# Patient Record
Sex: Female | Born: 1945
Health system: Southern US, Community
[De-identification: ages and names within clinical notes are randomized; demographics above are authoritative.]

## PROBLEM LIST (undated history)

## (undated) DIAGNOSIS — R413 Other amnesia: Secondary | ICD-10-CM

## (undated) DIAGNOSIS — E785 Hyperlipidemia, unspecified: Secondary | ICD-10-CM

## (undated) DIAGNOSIS — S3992XA Unspecified injury of lower back, initial encounter: Secondary | ICD-10-CM

## (undated) DIAGNOSIS — I10 Essential (primary) hypertension: Secondary | ICD-10-CM

## (undated) DIAGNOSIS — J45909 Unspecified asthma, uncomplicated: Secondary | ICD-10-CM

## (undated) DIAGNOSIS — M1990 Unspecified osteoarthritis, unspecified site: Secondary | ICD-10-CM

## (undated) DIAGNOSIS — I519 Heart disease, unspecified: Secondary | ICD-10-CM

## (undated) DIAGNOSIS — I219 Acute myocardial infarction, unspecified: Secondary | ICD-10-CM

## (undated) DIAGNOSIS — T7840XA Allergy, unspecified, initial encounter: Secondary | ICD-10-CM

## (undated) DIAGNOSIS — Z8619 Personal history of other infectious and parasitic diseases: Secondary | ICD-10-CM

## (undated) DIAGNOSIS — G43909 Migraine, unspecified, not intractable, without status migrainosus: Secondary | ICD-10-CM

## (undated) DIAGNOSIS — Z86718 Personal history of other venous thrombosis and embolism: Secondary | ICD-10-CM

## (undated) HISTORY — PX: ABDOMINAL HYSTERECTOMY: SHX81

## (undated) HISTORY — DX: Migraine, unspecified, not intractable, without status migrainosus: G43.909

## (undated) HISTORY — PX: APPENDECTOMY: SHX54

## (undated) HISTORY — DX: Unspecified osteoarthritis, unspecified site: M19.90

## (undated) HISTORY — DX: Allergy, unspecified, initial encounter: T78.40XA

## (undated) HISTORY — DX: Personal history of other venous thrombosis and embolism: Z86.718

## (undated) HISTORY — PX: BOWEL RESECTION: SHX1257

## (undated) HISTORY — DX: Heart disease, unspecified: I51.9

## (undated) HISTORY — DX: Essential (primary) hypertension: I10

## (undated) HISTORY — DX: Hyperlipidemia, unspecified: E78.5

---

## 2008-01-01 ENCOUNTER — Ambulatory Visit: Payer: Self-pay | Admitting: Family Medicine

## 2010-09-19 LAB — HM MAMMOGRAPHY: HM Mammogram: NORMAL

## 2010-09-27 ENCOUNTER — Emergency Department: Payer: Self-pay | Admitting: Emergency Medicine

## 2012-12-01 ENCOUNTER — Ambulatory Visit: Payer: Self-pay

## 2014-07-17 ENCOUNTER — Observation Stay: Payer: Self-pay | Admitting: Specialist

## 2014-07-17 LAB — CBC
HCT: 41 % (ref 35.0–47.0)
HGB: 12.8 g/dL (ref 12.0–16.0)
MCH: 26.5 pg (ref 26.0–34.0)
MCHC: 31.3 g/dL — ABNORMAL LOW (ref 32.0–36.0)
MCV: 85 fL (ref 80–100)
PLATELETS: 288 10*3/uL (ref 150–440)
RBC: 4.83 10*6/uL (ref 3.80–5.20)
RDW: 14.2 % (ref 11.5–14.5)
WBC: 11.9 10*3/uL — AB (ref 3.6–11.0)

## 2014-07-17 LAB — COMPREHENSIVE METABOLIC PANEL
ALBUMIN: 3.7 g/dL (ref 3.4–5.0)
ANION GAP: 13 (ref 7–16)
Alkaline Phosphatase: 67 U/L
BILIRUBIN TOTAL: 0.5 mg/dL (ref 0.2–1.0)
BUN: 14 mg/dL (ref 7–18)
Calcium, Total: 9 mg/dL (ref 8.5–10.1)
Chloride: 106 mmol/L (ref 98–107)
Co2: 22 mmol/L (ref 21–32)
Creatinine: 0.94 mg/dL (ref 0.60–1.30)
EGFR (Non-African Amer.): >60
Glucose: 98 mg/dL (ref 65–99)
Osmolality: 282 (ref 275–301)
Potassium: 3.9 mmol/L (ref 3.5–5.1)
SGOT(AST): 24 U/L (ref 15–37)
SGPT (ALT): 18 U/L
Sodium: 141 mmol/L (ref 136–145)
Total Protein: 6.8 g/dL (ref 6.4–8.2)

## 2014-07-17 LAB — URINALYSIS, COMPLETE
BACTERIA: NONE SEEN
Bilirubin,UR: NEGATIVE
Blood: NEGATIVE
Glucose,UR: NEGATIVE mg/dL (ref 0–75)
Leukocyte Esterase: NEGATIVE
Nitrite: NEGATIVE
PH: 7 (ref 4.5–8.0)
Protein: NEGATIVE
RBC,UR: <1 /HPF (ref 0–5)
SQUAMOUS EPITHELIAL: NONE SEEN
Specific Gravity: 1.008 (ref 1.003–1.030)

## 2014-07-17 LAB — DRUG SCREEN, URINE

## 2014-07-17 LAB — TROPONIN I: Troponin-I: <0.02 ng/mL

## 2014-07-17 LAB — ETHANOL: Ethanol: <3 mg/dL

## 2014-07-18 LAB — BASIC METABOLIC PANEL
Anion Gap: 8 (ref 7–16)
BUN: 12 mg/dL (ref 7–18)
CALCIUM: 9.1 mg/dL (ref 8.5–10.1)
CHLORIDE: 106 mmol/L (ref 98–107)
CO2: 25 mmol/L (ref 21–32)
Creatinine: 0.88 mg/dL (ref 0.60–1.30)
EGFR (African American): >60
Glucose: 82 mg/dL (ref 65–99)
Osmolality: 276 (ref 275–301)
Potassium: 3.5 mmol/L (ref 3.5–5.1)
Sodium: 139 mmol/L (ref 136–145)

## 2014-07-18 LAB — CBC WITH DIFFERENTIAL/PLATELET
BASOS PCT: 0.2 %
Basophil #: 0 10*3/uL (ref 0.0–0.1)
EOS ABS: 0 10*3/uL (ref 0.0–0.7)
Eosinophil %: 0.3 %
HCT: 38.1 % (ref 35.0–47.0)
HGB: 12 g/dL (ref 12.0–16.0)
LYMPHS ABS: 1.9 10*3/uL (ref 1.0–3.6)
LYMPHS PCT: 20 %
MCH: 26.7 pg (ref 26.0–34.0)
MCHC: 31.4 g/dL — AB (ref 32.0–36.0)
MCV: 85 fL (ref 80–100)
MONO ABS: 0.8 x10 3/mm (ref 0.2–0.9)
MONOS PCT: 8.7 %
NEUTROS ABS: 6.8 10*3/uL — AB (ref 1.4–6.5)
Neutrophil %: 70.8 %
PLATELETS: 260 10*3/uL (ref 150–440)
RBC: 4.48 10*6/uL (ref 3.80–5.20)
RDW: 13.8 % (ref 11.5–14.5)
WBC: 9.6 10*3/uL (ref 3.6–11.0)

## 2014-07-18 LAB — LIPID PANEL
Cholesterol: 206 mg/dL — ABNORMAL HIGH (ref 0–200)
HDL Cholesterol: 57 mg/dL (ref 40–60)
Ldl Cholesterol, Calc: 133 mg/dL — ABNORMAL HIGH (ref 0–100)
Triglycerides: 78 mg/dL (ref 0–200)
VLDL CHOLESTEROL, CALC: 16 mg/dL (ref 5–40)

## 2014-07-18 LAB — TSH: THYROID STIMULATING HORM: 0.601 u[IU]/mL

## 2014-08-30 DIAGNOSIS — S060X9A Concussion with loss of consciousness of unspecified duration, initial encounter: Secondary | ICD-10-CM | POA: Insufficient documentation

## 2014-09-05 LAB — HM COLONOSCOPY: HM Colonoscopy: NORMAL

## 2014-09-13 ENCOUNTER — Emergency Department: Payer: Self-pay | Admitting: Emergency Medicine

## 2014-09-13 LAB — BASIC METABOLIC PANEL
ANION GAP: 8 (ref 7–16)
BUN: 10 mg/dL (ref 7–18)
CHLORIDE: 107 mmol/L (ref 98–107)
Calcium, Total: 8.6 mg/dL (ref 8.5–10.1)
Co2: 23 mmol/L (ref 21–32)
Creatinine: 1.06 mg/dL (ref 0.60–1.30)
GLUCOSE: 98 mg/dL (ref 65–99)
Osmolality: 275 (ref 275–301)
Potassium: 3.8 mmol/L (ref 3.5–5.1)
Sodium: 138 mmol/L (ref 136–145)

## 2014-09-13 LAB — CBC
HCT: 39.3 % (ref 35.0–47.0)
HGB: 12.6 g/dL (ref 12.0–16.0)
MCH: 27.3 pg (ref 26.0–34.0)
MCHC: 32 g/dL (ref 32.0–36.0)
MCV: 85 fL (ref 80–100)
PLATELETS: 281 10*3/uL (ref 150–440)
RBC: 4.6 10*6/uL (ref 3.80–5.20)
RDW: 14.7 % — ABNORMAL HIGH (ref 11.5–14.5)
WBC: 9.1 10*3/uL (ref 3.6–11.0)

## 2014-09-13 LAB — TROPONIN I: Troponin-I: <0.02 ng/mL

## 2014-10-05 DIAGNOSIS — G2581 Restless legs syndrome: Secondary | ICD-10-CM | POA: Insufficient documentation

## 2014-11-08 DIAGNOSIS — F0781 Postconcussional syndrome: Secondary | ICD-10-CM | POA: Insufficient documentation
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
## 2015-07-11 ENCOUNTER — Other Ambulatory Visit: Payer: Self-pay | Admitting: Unknown Physician Specialty

## 2015-09-20 ENCOUNTER — Encounter: Payer: Self-pay | Admitting: Family Medicine

## 2015-09-20 ENCOUNTER — Encounter (INDEPENDENT_AMBULATORY_CARE_PROVIDER_SITE_OTHER): Payer: Self-pay

## 2015-09-20 ENCOUNTER — Ambulatory Visit (INDEPENDENT_AMBULATORY_CARE_PROVIDER_SITE_OTHER): Payer: Medicare Other | Admitting: Family Medicine

## 2015-09-20 VITALS — BP 117/75 | HR 68 | Temp 98.0°F | Resp 16 | Ht 61.0 in | Wt 114.2 lb

## 2015-09-20 DIAGNOSIS — Z23 Encounter for immunization: Secondary | ICD-10-CM | POA: Diagnosis not present

## 2015-09-20 DIAGNOSIS — I10 Essential (primary) hypertension: Secondary | ICD-10-CM | POA: Diagnosis not present

## 2015-09-20 DIAGNOSIS — E785 Hyperlipidemia, unspecified: Secondary | ICD-10-CM

## 2015-09-20 DIAGNOSIS — G8929 Other chronic pain: Secondary | ICD-10-CM | POA: Diagnosis not present

## 2015-09-20 DIAGNOSIS — J4520 Mild intermittent asthma, uncomplicated: Secondary | ICD-10-CM | POA: Diagnosis not present

## 2015-09-20 DIAGNOSIS — Z7189 Other specified counseling: Secondary | ICD-10-CM | POA: Diagnosis not present

## 2015-09-20 DIAGNOSIS — R011 Cardiac murmur, unspecified: Secondary | ICD-10-CM | POA: Insufficient documentation

## 2015-09-20 DIAGNOSIS — J45909 Unspecified asthma, uncomplicated: Secondary | ICD-10-CM | POA: Insufficient documentation

## 2015-09-20 DIAGNOSIS — J4540 Moderate persistent asthma, uncomplicated: Secondary | ICD-10-CM | POA: Insufficient documentation

## 2015-09-20 DIAGNOSIS — Z7689 Persons encountering health services in other specified circumstances: Secondary | ICD-10-CM

## 2015-09-20 MED ORDER — ASPIRIN EC 81 MG PO TBEC: 81.0000 mg | DELAYED_RELEASE_TABLET | Freq: Every day | ORAL | Status: AC

## 2015-09-20 NOTE — Assessment & Plan Note (Signed)
Continue atorvastatin to lower hyperlipidemia. Check lipid and CMP.
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
## 2015-09-20 NOTE — Assessment & Plan Note (Signed)
Controlled in the office today. Continue current medications.  ACE for renal protection.  Check CMP

## 2015-09-20 NOTE — Assessment & Plan Note (Signed)
Pt aware this practice does not prescribe chronic pain. She will continue her tramadol from previous provider.  Pt would like to discuss referral to pain provider at next visit.

## 2015-09-20 NOTE — Assessment & Plan Note (Signed)
Occasional symptoms. Pt has UTD inhaler at home. Due for spirometry next visit.

## 2015-09-20 NOTE — Assessment & Plan Note (Signed)
Pt reports murmur heard in the past. Unsure if cardiology consult or ECHO done. Awaiting records from previous provider. Consider cardiology consult or ECHO if not done.

## 2015-09-20 NOTE — Patient Instructions (Signed)
Your goal blood pressure is 140/90. Work on low salt/sodium diet - goal <2.5gm (2,500mg) per day. Eat a diet high in fruits/vegetables and whole grains.  Look into mediterranean and DASH diet. Goal activity is 150min/wk of moderate intensity exercise.  This can be split into 30 minute chunks.  If you are not at this level, you can start with smaller 10-15 min increments and slowly build up activity. Look at www.heart.org for more resources  Please seek immediate medical attention at ER or Urgent Care if you develop: Chest pain, pressure or tightness. Shortness of breath accompanied by nausea or diaphoresis Visual changes Numbness or tingling on one side of the body Facial droop Altered mental status Or any concerning symptoms.  

## 2015-10-04 ENCOUNTER — Other Ambulatory Visit: Payer: Self-pay | Admitting: Unknown Physician Specialty

## 2015-10-18 DIAGNOSIS — I10 Essential (primary) hypertension: Secondary | ICD-10-CM | POA: Diagnosis not present

## 2015-10-18 DIAGNOSIS — E785 Hyperlipidemia, unspecified: Secondary | ICD-10-CM | POA: Diagnosis not present

## 2015-11-09 DIAGNOSIS — I10 Essential (primary) hypertension: Secondary | ICD-10-CM | POA: Diagnosis not present

## 2015-11-09 DIAGNOSIS — E785 Hyperlipidemia, unspecified: Secondary | ICD-10-CM | POA: Diagnosis not present

## 2015-11-10 LAB — LIPID PANEL
CHOL/HDL RATIO: 4.1 ratio (ref 0.0–4.4)
Cholesterol, Total: 205 mg/dL — ABNORMAL HIGH (ref 100–199)
HDL: 50 mg/dL (ref >39)
LDL Calculated: 130 mg/dL — ABNORMAL HIGH (ref 0–99)
TRIGLYCERIDES: 124 mg/dL (ref 0–149)
VLDL Cholesterol Cal: 25 mg/dL (ref 5–40)

## 2015-11-10 LAB — COMPREHENSIVE METABOLIC PANEL
ALBUMIN: 4.3 g/dL (ref 3.6–4.8)
ALK PHOS: 64 IU/L (ref 39–117)
ALT: 15 IU/L (ref 0–32)
AST: 19 IU/L (ref 0–40)
Albumin/Globulin Ratio: 2 (ref 1.1–2.5)
BILIRUBIN TOTAL: 0.5 mg/dL (ref 0.0–1.2)
BUN / CREAT RATIO: 7 — AB (ref 11–26)
BUN: 7 mg/dL — ABNORMAL LOW (ref 8–27)
CHLORIDE: 103 mmol/L (ref 96–106)
CO2: 25 mmol/L (ref 18–29)
Calcium: 9.2 mg/dL (ref 8.7–10.3)
Creatinine, Ser: 0.95 mg/dL (ref 0.57–1.00)
GFR calc Af Amer: 72 mL/min/{1.73_m2} (ref >59)
GFR calc non Af Amer: 62 mL/min/{1.73_m2} (ref >59)
GLUCOSE: 85 mg/dL (ref 65–99)
Globulin, Total: 2.2 g/dL (ref 1.5–4.5)
Potassium: 4.2 mmol/L (ref 3.5–5.2)
Sodium: 145 mmol/L — ABNORMAL HIGH (ref 134–144)
Total Protein: 6.5 g/dL (ref 6.0–8.5)

## 2015-11-17 ENCOUNTER — Telehealth: Payer: Self-pay | Admitting: Family Medicine

## 2015-11-17 NOTE — Telephone Encounter (Signed)
I have never prescribed that medication for this patient.  It looks like the last time she got it was October of 2016 from a different provider. I would need to see her for a visit to discuss why she needs it/ how often she was taking etc. If it became a chronic medication, she would need to see pain management. Thanks! AK   Notes Recorded by Alease FrameSonya S Carter, CMA on 11/17/2015 at 11:52 AM Made patient aware that she may be referred to pain management for long term pain management. Notes Recorded by Alease FrameSonya S Carter, CMA on 11/17/2015 at 11:51 AM Patient aware is of labs. She is requesting refill/ increase on Tramadol.

## 2015-11-17 NOTE — Telephone Encounter (Signed)
-----   Message from Alease FrameSonya S Carter, [place] sent at 11/17/2015 11:52 AM EST ----- Made patient aware that she may be referred to pain management for long term pain management.

## 2015-11-17 NOTE — Telephone Encounter (Signed)
-----   Message from Kirsten [name], [place] sent at 11/17/2015 11:51 AM EST ----- Patient aware is of labs. She is requesting refill/ increase on Tramadol.

## 2015-11-18 ENCOUNTER — Ambulatory Visit (INDEPENDENT_AMBULATORY_CARE_PROVIDER_SITE_OTHER): Payer: Medicare Other | Admitting: Family Medicine

## 2015-11-18 ENCOUNTER — Other Ambulatory Visit: Payer: Self-pay | Admitting: Family Medicine

## 2015-11-18 VITALS — BP 113/75 | HR 73 | Temp 98.0°F | Resp 16 | Ht 61.0 in | Wt 114.0 lb

## 2015-11-18 DIAGNOSIS — M25552 Pain in left hip: Secondary | ICD-10-CM

## 2015-11-18 DIAGNOSIS — R209 Unspecified disturbances of skin sensation: Secondary | ICD-10-CM

## 2015-11-18 DIAGNOSIS — G43909 Migraine, unspecified, not intractable, without status migrainosus: Secondary | ICD-10-CM | POA: Insufficient documentation

## 2015-11-18 DIAGNOSIS — R413 Other amnesia: Secondary | ICD-10-CM | POA: Insufficient documentation

## 2015-11-18 DIAGNOSIS — R51 Headache: Secondary | ICD-10-CM

## 2015-11-18 DIAGNOSIS — M160 Bilateral primary osteoarthritis of hip: Secondary | ICD-10-CM | POA: Insufficient documentation

## 2015-11-18 DIAGNOSIS — G8929 Other chronic pain: Secondary | ICD-10-CM

## 2015-11-18 DIAGNOSIS — R519 Headache, unspecified: Secondary | ICD-10-CM | POA: Insufficient documentation

## 2015-11-18 DIAGNOSIS — IMO0001 Reserved for inherently not codable concepts without codable children: Secondary | ICD-10-CM

## 2015-11-18 MED ORDER — GABAPENTIN 100 MG PO CAPS: 100.0000 mg | ORAL_CAPSULE | Freq: Three times a day (TID) | ORAL | Status: DC

## 2015-11-18 MED ORDER — TRAMADOL-ACETAMINOPHEN 37.5-325 MG PO TABS: 1.0000 | ORAL_TABLET | Freq: Three times a day (TID) | ORAL | Status: DC | PRN

## 2015-11-18 NOTE — Patient Instructions (Addendum)
We will check some things out in your labs to determine the cause of your symptoms. For the hand and feet burning, we can start gabapentin.   Gabapentin: Week 1:  Take 1 pill at bedtime. Week 2: Take 1 pill in the morning and 1 at bedtime if needed.  Week 3: Take 1 pill in the morning, lunch, and bedtime if needed.   Gabapentin capsules or tablets What is this medicine? GABAPENTIN (GA ba pen tin) is used to control partial seizures in adults with epilepsy. It is also used to treat certain types of nerve pain. This medicine may be used for other purposes; ask your health care provider or pharmacist if you have questions. What should I tell my health care provider before I take this medicine? They need to know if you have any of these conditions: -kidney disease -suicidal thoughts, plans, or attempt; a previous suicide attempt by you or a family member -an unusual or allergic reaction to gabapentin, other medicines, foods, dyes, or preservatives -pregnant or trying to get pregnant -breast-feeding How should I use this medicine? Take this medicine by mouth with a glass of water. Follow the directions on the prescription label. You can take it with or without food. If it upsets your stomach, take it with food.Take your medicine at regular intervals. Do not take it more often than directed. Do not stop taking except on your doctor's advice. If you are directed to break the 600 or 800 mg tablets in half as part of your dose, the extra half tablet should be used for the next dose. If you have not used the extra half tablet within 28 days, it should be thrown away. A special MedGuide will be given to you by the pharmacist with each prescription and refill. Be sure to read this information carefully each time. Talk to your pediatrician regarding the use of this medicine in children. Special care may be needed. Overdosage: If you think you have taken too much of this medicine contact a poison control  center or emergency room at once. NOTE: This medicine is only for you. Do not share this medicine with others. What if I miss a dose? If you miss a dose, take it as soon as you can. If it is almost time for your next dose, take only that dose. Do not take double or extra doses. What may interact with this medicine? Do not take this medicine with any of the following medications: -other gabapentin products This medicine may also interact with the following medications: -alcohol -antacids -antihistamines for allergy, cough and cold -certain medicines for anxiety or sleep -certain medicines for depression or psychotic disturbances -homatropine; hydrocodone -naproxen -narcotic medicines (opiates) for pain -phenothiazines like chlorpromazine, mesoridazine, prochlorperazine, thioridazine This list may not describe all possible interactions. Give your health care provider a list of all the medicines, herbs, non-prescription drugs, or dietary supplements you use. Also tell them if you smoke, drink alcohol, or use illegal drugs. Some items may interact with your medicine. What should I watch for while using this medicine? Visit your doctor or health care professional for regular checks on your progress. You may want to keep a record at home of how you feel your condition is responding to treatment. You may want to share this information with your doctor or health care professional at each visit. You should contact your doctor or health care professional if your seizures get worse or if you have any new types of seizures. Do not  stop taking this medicine or any of your seizure medicines unless instructed by your doctor or health care professional. Stopping your medicine suddenly can increase your seizures or their severity. Wear a medical identification bracelet or chain if you are taking this medicine for seizures, and carry a card that lists all your medications. You may get drowsy, dizzy, or have  blurred vision. Do not drive, use machinery, or do anything that needs mental alertness until you know how this medicine affects you. To reduce dizzy or fainting spells, do not sit or stand up quickly, especially if you are an older patient. Alcohol can increase drowsiness and dizziness. Avoid alcoholic drinks. Your mouth may get dry. Chewing sugarless gum or sucking hard candy, and drinking plenty of water will help. The use of this medicine may increase the chance of suicidal thoughts or actions. Pay special attention to how you are responding while on this medicine. Any worsening of mood, or thoughts of suicide or dying should be reported to your health care professional right away. Women who become pregnant while using this medicine may enroll in the [place] Antiepileptic Drug Pregnancy Registry by calling [phone])[phone]. This registry collects information about the safety of antiepileptic drug use during pregnancy. What side effects may I notice from receiving this medicine? Side effects that you should report to your doctor or health care professional as soon as possible: -allergic reactions like skin rash, itching or hives, swelling of the face, lips, or tongue -worsening of mood, thoughts or actions of suicide or dying Side effects that usually do not require medical attention (report to your doctor or health care professional if they continue or are bothersome): -constipation -difficulty walking or controlling muscle movements -dizziness -nausea -slurred speech -tiredness -tremors -weight gain This list may not describe all possible side effects. Call your doctor for medical advice about side effects. You may report side effects to FDA at 1-800-FDA-1088. Where should I keep my medicine? Keep out of reach of children. This medicine may cause accidental overdose and death if it taken by other adults, children, or pets. Mix any unused medicine with a substance like cat litter or  coffee grounds. Then throw the medicine away in a sealed container like a sealed bag or a coffee can with a lid. Do not use the medicine after the expiration date. Store at room temperature between 15 and 30 degrees C (59 and 86 degrees F). NOTE: This sheet is a summary. It may not cover all possible information. If you have questions about this medicine, talk to your doctor, pharmacist, or health care provider.    2016, Elsevier/Gold Standard. (2013-12-18 15:26:50)

## 2015-11-18 NOTE — Assessment & Plan Note (Addendum)
PRN occasional tramadol given. Pt is aware that if she requires an increasing dose of is taking daily we may send her to chronic pain for evaluation.  Recommended PT- pt declined.  Gentle stretching at home.
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
## 2015-11-19 LAB — CBC WITH DIFFERENTIAL/PLATELET
BASOS: 0 %
Basophils Absolute: 0 10*3/uL (ref 0.0–0.2)
EOS (ABSOLUTE): 0.1 10*3/uL (ref 0.0–0.4)
Eos: 1 %
HEMATOCRIT: 37.1 % (ref 34.0–46.6)
Hemoglobin: 12 g/dL (ref 11.1–15.9)
IMMATURE GRANS (ABS): 0 10*3/uL (ref 0.0–0.1)
Immature Granulocytes: 0 %
LYMPHS: 27 %
Lymphocytes Absolute: 2.3 10*3/uL (ref 0.7–3.1)
MCH: 27.2 pg (ref 26.6–33.0)
MCHC: 32.3 g/dL (ref 31.5–35.7)
MCV: 84 fL (ref 79–97)
Monocytes Absolute: 0.6 10*3/uL (ref 0.1–0.9)
Monocytes: 7 %
NEUTROS ABS: 5.4 10*3/uL (ref 1.4–7.0)
Neutrophils: 65 %
PLATELETS: 308 10*3/uL (ref 150–379)
RBC: 4.41 x10E6/uL (ref 3.77–5.28)
RDW: 14.8 % (ref 12.3–15.4)
WBC: 8.5 10*3/uL (ref 3.4–10.8)

## 2015-11-19 LAB — VITAMIN B12: Vitamin B-12: 586 pg/mL (ref 211–946)

## 2015-11-19 LAB — HEMOGLOBIN A1C
Est. average glucose Bld gHb Est-mCnc: 117 mg/dL
Hgb A1c MFr Bld: 5.7 % — ABNORMAL HIGH (ref 4.8–5.6)

## 2015-11-19 LAB — TSH: TSH: 1.26 u[IU]/mL (ref 0.450–4.500)

## 2015-12-21 ENCOUNTER — Ambulatory Visit (INDEPENDENT_AMBULATORY_CARE_PROVIDER_SITE_OTHER): Payer: Medicare Other | Admitting: Family Medicine

## 2015-12-21 ENCOUNTER — Other Ambulatory Visit: Payer: Self-pay | Admitting: Family Medicine

## 2015-12-21 VITALS — BP 132/82 | HR 69 | Temp 98.7°F | Resp 16 | Ht 61.0 in | Wt 114.0 lb

## 2015-12-21 DIAGNOSIS — J069 Acute upper respiratory infection, unspecified: Secondary | ICD-10-CM

## 2015-12-21 DIAGNOSIS — J029 Acute pharyngitis, unspecified: Secondary | ICD-10-CM

## 2015-12-21 DIAGNOSIS — R011 Cardiac murmur, unspecified: Secondary | ICD-10-CM

## 2015-12-21 DIAGNOSIS — J209 Acute bronchitis, unspecified: Secondary | ICD-10-CM | POA: Diagnosis not present

## 2015-12-21 LAB — POCT RAPID STREP A (OFFICE): Rapid Strep A Screen: NEGATIVE

## 2015-12-21 MED ORDER — PREDNISONE 20 MG PO TABS: 20.0000 mg | ORAL_TABLET | Freq: Every day | ORAL | Status: DC

## 2015-12-21 MED ORDER — AZITHROMYCIN 250 MG PO TABS: ORAL_TABLET | ORAL | Status: DC

## 2015-12-21 MED ORDER — PREDNISONE 20 MG PO TABS: 40.0000 mg | ORAL_TABLET | Freq: Every day | ORAL | Status: DC

## 2015-12-21 MED ORDER — FLUTICASONE PROPIONATE 50 MCG/ACT NA SUSP: 2.0000 | Freq: Every day | NASAL | Status: DC

## 2015-12-21 MED ORDER — DM-GUAIFENESIN ER 30-600 MG PO TB12: 1.0000 | ORAL_TABLET | Freq: Two times a day (BID) | ORAL | Status: DC

## 2015-12-21 NOTE — Progress Notes (Signed)
Subjective:    Patient ID: Kirsten [name], female    DOB: 1946/02/01, 70 y.o.   MRN: [id number]  HPI: Kirsten [name] is a 70 y.o. female presenting on 12/21/2015 for Nasal Congestion   HPI  Pt presents for nasal congestion and sore throat x 1 mos. Glands are swollen. Nasal congestion. Headache and ear pain. Cough- not productive of sputum. Some chest tightness and feeling short winded. No fevers.    Pt would like to see a cardiologist about a heart murmur heard previously on exam.   Past Medical History  Diagnosis Date  . Allergy   . Arthritis   . History of blood clots   . Depression   . Heart disease   . Hyperlipidemia   . Hypertension   . Migraine headache     Current Outpatient Prescriptions on File Prior to Visit  Medication Sig  . albuterol (PROVENTIL HFA) 108 (90 BASE) MCG/ACT inhaler Inhale 2 puffs into the lungs every 6 (six) hours as needed for wheezing or shortness of breath.  Marland Kitchen amLODipine-benazepril (LOTREL) 10-20 MG capsule TK ONE C PO  QD  . aspirin EC 81 MG tablet Take 1 tablet (81 mg total) by mouth daily.  Marland Kitchen atorvastatin (LIPITOR) 20 MG tablet Take 20 mg by mouth daily.  Marland Kitchen gabapentin (NEURONTIN) 100 MG capsule Take 1 capsule (100 mg total) by mouth 3 (three) times daily.  . traMADol-acetaminophen (ULTRACET) 37.5-325 MG tablet Take 1 tablet by mouth every 8 (eight) hours as needed.   No current facility-administered medications on file prior to visit.    Review of Systems  Constitutional: Negative for fever and chills.  HENT: Positive for congestion, sinus pressure, sore throat and trouble swallowing. Negative for ear pain and sneezing.   Respiratory: Positive for cough and chest tightness. Negative for wheezing.   Cardiovascular: Negative for chest pain and palpitations.  Gastrointestinal: Negative.  Negative for nausea, vomiting and diarrhea.  Musculoskeletal: Negative for neck pain and neck stiffness.  Neurological: Positive for headaches.   Per HPI  unless specifically indicated above     Objective:    BP 132/82 mmHg  Pulse 69  Temp(Src) 98.7 F (37.1 C) (Oral)  Resp 16  Ht  (1.549 m)  Wt 114 lb (51.71 kg)  BMI 21.55 kg/m2  SpO2 97%  Wt Readings from Last 3 Encounters:  12/21/15 114 lb (51.71 kg)  11/18/15 114 lb (51.71 kg)  09/20/15 114 lb 3.2 oz (51.801 kg)    Physical Exam  Constitutional: She appears well-developed and well-nourished. No distress.  HENT:  Head: Normocephalic and atraumatic.  Right Ear: Hearing and tympanic membrane normal. Tympanic membrane is not retracted and not bulging.  Left Ear: Hearing and tympanic membrane normal. Tympanic membrane is not retracted and not bulging.  Nose: Mucosal edema and rhinorrhea present. Right sinus exhibits no maxillary sinus tenderness and no frontal sinus tenderness. Left sinus exhibits no maxillary sinus tenderness and no frontal sinus tenderness.  Mouth/Throat: No uvula swelling. Posterior oropharyngeal erythema present. No oropharyngeal exudate.  Cardiovascular: Normal rate and regular rhythm.  Exam reveals no gallop and no friction rub.   Murmur heard.  Systolic murmur is present with a grade of 2/6  Pulmonary/Chest: No respiratory distress. She has no decreased breath sounds. She has wheezes in the right lower field and the left lower field. She has no rhonchi. She has no rales. Chest wall is not dull to percussion. She exhibits no tenderness.  Skin: She is not  diaphoretic.   Results for orders placed or performed in visit on 12/21/15  POCT rapid strep A  Result Value Ref Range   Rapid Strep A Screen Negative Negative      Assessment & Plan:   Problem List Items Addressed This Visit      Other   Heart murmur    Refer to cardiology for evaluation and management.       Relevant Orders   Ambulatory referral to Cardiology    Other Visit Diagnoses    Sore throat    -  Primary    Rapid strep is negative. Throat culture pending. Supportive care at home.      Relevant Orders    POCT rapid strep A (Completed)    Culture, Group A Strep    Acute bronchitis, unspecified organism        Treat for bronchitis given pt history of asthma. Zpak to cover for repiratory organisms. PRN inhaler use. Prednisone for respiratory inflammation. Alarm symptoms reviewed.     Relevant Medications    azithromycin (ZITHROMAX) 250 MG tablet    predniSONE (DELTASONE) 20 MG tablet    Upper respiratory infection        Supportive care at home. Alarm symptoms and return precautions reviewed.     Relevant Medications    azithromycin (ZITHROMAX) 250 MG tablet    fluticasone (FLONASE) 50 MCG/ACT nasal spray    dextromethorphan-guaiFENesin (MUCINEX DM) 30-600 MG 12hr tablet       Meds ordered this encounter  Medications  . DISCONTD: predniSONE (DELTASONE) 20 MG tablet    Sig: Take 1 tablet (20 mg total) by mouth daily with breakfast.    Dispense:  10 tablet    Refill:  0    Order Specific Question:  Supervising Provider    Answer:  Janeann Forehand [phone]  . azithromycin (ZITHROMAX) 250 MG tablet    Sig: Take 2 pills today and 1 pill daily until the bottle is empty.    Dispense:  6 tablet    Refill:  0    Order Specific Question:  Supervising Provider    Answer:  Janeann Forehand [[id number]]  . fluticasone (FLONASE) 50 MCG/ACT nasal spray    Sig: Place 2 sprays into both nostrils daily.    Dispense:  16 g    Refill:  11    Order Specific Question:  Supervising Provider    Answer:  Janeann Forehand [phone]  . dextromethorphan-guaiFENesin (MUCINEX DM) 30-600 MG 12hr tablet    Sig: Take 1 tablet by mouth 2 (two) times daily.    Dispense:  20 tablet    Refill:  0    Order Specific Question:  Supervising Provider    Answer:  Janeann Forehand [phone]  . predniSONE (DELTASONE) 20 MG tablet    Sig: Take 2 tablets (40 mg total) by mouth daily with breakfast.    Dispense:  10 tablet    Refill:  0    Order Specific Question:  Supervising Provider     Answer:  Janeann Forehand [[id number]]      Follow up plan: Return if symptoms worsen or fail to improve.

## 2015-12-21 NOTE — Assessment & Plan Note (Signed)
Refer to cardiology for evaluation and management.

## 2015-12-21 NOTE — Patient Instructions (Addendum)
You can use supportive care at home to help with your symptoms. I have sent Mucinex DM to your pharmacy to help break up the congestion and soothe your cough. You can takes this twice daily. Honey is a natural cough suppressant- so add it to your tea in the morning.  If you have a humidifer, set that up in your bedroom at night.   Use saline rinses to help with sinuses. Use flonase 2 sprays in the nose once daily to help with nasal drainage and stuffiness.   Alternate advil and tylenol as needed for fever.    Please seek immediate medical attention if you develop shortness of breath not relieve by inhaler, chest pain/tightness, fever > 103 F or other concerning symptoms.

## 2015-12-23 LAB — CULTURE, GROUP A STREP: Strep A Culture: NEGATIVE

## 2015-12-26 ENCOUNTER — Encounter: Payer: Self-pay | Admitting: *Deleted

## 2015-12-26 ENCOUNTER — Ambulatory Visit: Payer: Self-pay | Admitting: Cardiovascular Disease

## 2016-01-10 ENCOUNTER — Ambulatory Visit: Payer: Medicare Other | Admitting: Family Medicine

## 2016-01-11 ENCOUNTER — Ambulatory Visit
Admission: RE | Admit: 2016-01-11 | Discharge: 2016-01-11 | Disposition: A | Discharge status: Home / Self Care | Payer: Medicare Other | Source: Ambulatory Visit | Attending: Family Medicine | Admitting: Family Medicine

## 2016-01-11 ENCOUNTER — Ambulatory Visit (INDEPENDENT_AMBULATORY_CARE_PROVIDER_SITE_OTHER): Payer: Medicare Other | Admitting: Family Medicine

## 2016-01-11 ENCOUNTER — Encounter: Payer: Self-pay | Admitting: Family Medicine

## 2016-01-11 VITALS — BP 135/89 | HR 65 | Temp 98.2°F | Resp 16 | Ht 61.0 in | Wt 114.0 lb

## 2016-01-11 DIAGNOSIS — M160 Bilateral primary osteoarthritis of hip: Secondary | ICD-10-CM | POA: Insufficient documentation

## 2016-01-11 DIAGNOSIS — M25552 Pain in left hip: Secondary | ICD-10-CM

## 2016-01-11 DIAGNOSIS — G8929 Other chronic pain: Secondary | ICD-10-CM | POA: Diagnosis not present

## 2016-01-11 DIAGNOSIS — M1612 Unilateral primary osteoarthritis, left hip: Secondary | ICD-10-CM | POA: Diagnosis not present

## 2016-01-11 NOTE — Patient Instructions (Signed)
We will refer you to orthopedics to discuss management of your hip pain.  We will also get an XR to make sure nothing is wrong with the hip today.   Continue to take pain medication as neede.d

## 2016-01-11 NOTE — Assessment & Plan Note (Signed)
Refer to ortho for evaluation and management. Pt has had good success with joint injections in the past.  Hip XR shows mild degenerative changes.
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
## 2016-01-23 ENCOUNTER — Other Ambulatory Visit: Payer: Self-pay | Admitting: Family Medicine

## 2016-01-23 DIAGNOSIS — M5441 Lumbago with sciatica, right side: Secondary | ICD-10-CM | POA: Diagnosis not present

## 2016-01-23 DIAGNOSIS — G8929 Other chronic pain: Secondary | ICD-10-CM | POA: Diagnosis not present

## 2016-01-24 ENCOUNTER — Telehealth: Payer: Self-pay | Admitting: Family Medicine

## 2016-01-24 NOTE — Telephone Encounter (Signed)
Pt. Called requesting refill on   Tramadol

## 2016-01-24 NOTE — Telephone Encounter (Signed)
Pt reports she is taking one nightly- however sometimes takes 2. She reports she has been in pain since orthopedic visit. Pt advised to call orthopedics. Pt wanted our office to call orthopedics- I told her I was unable to do that. Advised patient to take prednisone as prescribed by orthopedics to help with her pain. If she felt she needed more tramadol after that, she should come into our office for appt. I did reinforce to patient that she would need to see chronic pain is tramadol became more than an occasional medication.

## 2016-01-24 NOTE — Telephone Encounter (Signed)
Called pt and ask her how she is taking her reply she takes only 1 per night and pharmacy reply that she doen't have any refill left and she is in lots of pain she needs more tramadol.

## 2016-01-24 NOTE — Telephone Encounter (Signed)
She was only taking 1 per day and had enough to last her 3 months per her report at our last visit. It was written on 1/13. Looks like it only lasted 2 months. Please call her to see how often she is taking.

## 2016-01-27 ENCOUNTER — Other Ambulatory Visit: Payer: Self-pay | Admitting: Unknown Physician Specialty

## 2016-01-27 ENCOUNTER — Telehealth: Payer: Self-pay

## 2016-01-27 DIAGNOSIS — G8929 Other chronic pain: Secondary | ICD-10-CM

## 2016-01-27 DIAGNOSIS — M5441 Lumbago with sciatica, right side: Principal | ICD-10-CM

## 2016-01-27 DIAGNOSIS — M25552 Pain in left hip: Principal | ICD-10-CM

## 2016-01-27 NOTE — Telephone Encounter (Signed)
Called pt but phone is ringing and no VM set up Kirsten [name] placed an order for her to be seen by pain management and if she runs out before her appointment time with pain management then she needs to schedule an appointment with Kirsten [name].

## 2016-01-27 NOTE — Telephone Encounter (Signed)
Okay I will place an order for chronic pain management. I sent her to ortho for joint injections to help with pain. It seems like she just wants tramadol. Thanks! AK

## 2016-01-27 NOTE — Telephone Encounter (Signed)
[facility] called and said patient was calling both there and here for refills on Tramadol. They have denied them. They will proceed with MRI. Dr. [facility] agrees to send patient to Pain Management and states PCP needs to order this. They did discuss with patient to contact PCP about referral.

## 2016-01-30 ENCOUNTER — Ambulatory Visit (INDEPENDENT_AMBULATORY_CARE_PROVIDER_SITE_OTHER): Payer: Medicare Other | Admitting: Family Medicine

## 2016-01-30 VITALS — BP 139/81 | HR 75 | Temp 99.5°F | Resp 16 | Ht 61.0 in | Wt 112.6 lb

## 2016-01-30 DIAGNOSIS — M25552 Pain in left hip: Secondary | ICD-10-CM

## 2016-01-30 DIAGNOSIS — G8929 Other chronic pain: Secondary | ICD-10-CM

## 2016-01-30 DIAGNOSIS — M5442 Lumbago with sciatica, left side: Secondary | ICD-10-CM | POA: Diagnosis not present

## 2016-01-30 MED ORDER — TRAMADOL-ACETAMINOPHEN 37.5-325 MG PO TABS: 1.0000 | ORAL_TABLET | Freq: Two times a day (BID) | ORAL | Status: DC

## 2016-01-30 NOTE — Patient Instructions (Signed)
I am refilling your tramadol today for up to twice daily. Please only take 1 pill at a time. I have referred you over the a chronic pain specialist to help with your hip and back pain. They will make you an appt to see this doctor.   Please get your MRI done as instructed by orthopedics.

## 2016-01-30 NOTE — Progress Notes (Signed)
Subjective:    Patient ID: Kirsten [name], female    DOB: Oct 20, 1946, 70 y.o.   MRN: [id number]  HPI: Kirsten [name] is a 70 y.o. female presenting on 01/30/2016 for Back Pain   HPI  Pt presents for back pain and hip pain. She is requesting a refill on occasional tramadol. Was recently seen by orthopedics for joint injections and placed on prednisone. She could not take it due to nausea. Pt reports injection did not help. Ortho suggested that she seek a chronic pain referral due to her inability to tolerate their plan of care. They are getting an MRI of her spine on April 14. Pt reports pain level is 8/10. Legs aches at night.  Pt was using occasional tramadol per her report 1-2 times daily to help with pain. Last fill was 11/18/2015 per [place] CSRS for 90 pills. She reports she ran out last week. No other controlled medications.   Past Medical History  Diagnosis Date  . Allergy   . Arthritis   . History of blood clots   . Depression   . Heart disease   . Hyperlipidemia   . Hypertension   . Migraine headache     Current Outpatient Prescriptions on File Prior to Visit  Medication Sig  . albuterol (PROVENTIL HFA) 108 (90 BASE) MCG/ACT inhaler Inhale 2 puffs into the lungs every 6 (six) hours as needed for wheezing or shortness of breath.  Marland Kitchen amLODipine-benazepril (LOTREL) 10-20 MG capsule TK ONE C PO  QD  . aspirin EC 81 MG tablet Take 1 tablet (81 mg total) by mouth daily.  Marland Kitchen atorvastatin (LIPITOR) 20 MG tablet Take 20 mg by mouth daily.  Marland Kitchen dextromethorphan-guaiFENesin (MUCINEX DM) 30-600 MG 12hr tablet Take 1 tablet by mouth 2 (two) times daily.  . fluticasone (FLONASE) 50 MCG/ACT nasal spray SHAKE LIQUID AND USE 2 SPRAYS IN EACH NOSTRIL DAILY  . gabapentin (NEURONTIN) 100 MG capsule Take 1 capsule (100 mg total) by mouth 3 (three) times daily.   No current facility-administered medications on file prior to visit.    Review of Systems  Constitutional: Negative for fever and chills.   HENT: Negative.   Respiratory: Negative for cough, chest tightness and wheezing.   Cardiovascular: Negative for chest pain and leg swelling.  Gastrointestinal: Negative for nausea, vomiting, abdominal pain, diarrhea and constipation.  Endocrine: Negative.  Negative for cold intolerance, heat intolerance, polydipsia, polyphagia and polyuria.  Genitourinary: Negative for dysuria and difficulty urinating.  Musculoskeletal: Positive for back pain and arthralgias.  Neurological: Negative for dizziness, light-headedness and numbness.  Psychiatric/Behavioral: Negative.    Per HPI unless specifically indicated above     Objective:    BP 139/81 mmHg  Pulse 75  Temp(Src) 99.5 F (37.5 C) (Oral)  Resp 16  Ht  (1.549 m)  Wt 112 lb 9.6 oz (51.075 kg)  BMI 21.29 kg/m2  Wt Readings from Last 3 Encounters:  01/30/16 112 lb 9.6 oz (51.075 kg)  01/11/16 114 lb (51.71 kg)  12/21/15 114 lb (51.71 kg)    Physical Exam  Constitutional: She is oriented to person, place, and time. She appears well-developed and well-nourished.  HENT:  Head: Normocephalic and atraumatic.  Neck: Neck supple.  Cardiovascular: Normal rate and regular rhythm.  Exam reveals no gallop and no friction rub.   Murmur heard.  Systolic murmur is present with a grade of 6/6  Pulmonary/Chest: Effort normal and breath sounds normal. She has no wheezes. She exhibits no tenderness.  Abdominal: Soft. Normal appearance and bowel sounds are normal. She exhibits no distension and no mass. There is no tenderness. There is no rebound and no guarding.  Musculoskeletal: She exhibits no edema or tenderness.       Left hip: She exhibits decreased range of motion. She exhibits normal strength, no tenderness and no bony tenderness.       Lumbar back: She exhibits pain (with twisting and bending. ). She exhibits normal range of motion, no tenderness and no spasm.  Negative straight leg test bilaterally  Lymphadenopathy:    She has no  cervical adenopathy.  Neurological: She is alert and oriented to person, place, and time.  Skin: Skin is warm and dry.   Results for orders placed or performed in visit on 12/21/15  Culture, Group A Strep  Result Value Ref Range   Strep A Culture Negative   POCT rapid strep A  Result Value Ref Range   Rapid Strep A Screen Negative Negative      Assessment & Plan:   Problem List Items Addressed This Visit      Other   Chronic hip pain - Primary   Relevant Medications   traMADol-acetaminophen (ULTRACET) 37.5-325 MG tablet    Other Visit Diagnoses    Bilateral low back pain with left-sided sciatica        Pt will get MRI as scheduled by orthopedics. Continue to follow orthopedic recommendations and we will get her into chronic pain.     Relevant Medications    traMADol-acetaminophen (ULTRACET) 37.5-325 MG tablet       Meds ordered this encounter  Medications  . traMADol-acetaminophen (ULTRACET) 37.5-325 MG tablet    Sig: Take 1 tablet by mouth every 12 (twelve) hours.    Dispense:  60 tablet    Refill:  0    Order Specific Question:  Supervising Provider    Answer:  Janeann ForehandHAWKINS JR, JAMES H [[id number]]      Follow up plan: Return if symptoms worsen or fail to improve.

## 2016-02-14 ENCOUNTER — Telehealth: Payer: Self-pay | Admitting: Family Medicine

## 2016-02-14 NOTE — Telephone Encounter (Signed)
Ok. Noted. If she is unwilling to follow the instructions of orthopedics, we will try for a chronic pain referral. Thanks! AK

## 2016-02-14 NOTE — Telephone Encounter (Signed)
Patient has declined MRI. I called pain center and they have referral but are currently on 01/19/16.

## 2016-02-14 NOTE — Telephone Encounter (Signed)
Dr. Erin SonsHarold [name] ordered a MRI for pt scheduled for April 14th.  She said she is claustrophobic and wont be able to have it unless she can get something to calm her.  She would rather be referred to another ortho that will just give her cortisone shots.  Please call [phone]

## 2016-02-17 ENCOUNTER — Ambulatory Visit: Payer: Medicare Other

## 2016-03-05 ENCOUNTER — Telehealth: Payer: Self-pay | Admitting: *Deleted

## 2016-03-05 NOTE — Telephone Encounter (Signed)
Return the pt phone call. pt called stating "that she wish to cancel this appt due to she already been thru too much. she also stated that Jesus will heal her because she believes in God healing her, and she feels that this is just a test of her faith"...TD

## 2016-03-05 NOTE — Telephone Encounter (Signed)
Thank you I will be available if patient would like to discuss any issues I trust that she will do very well

## 2016-03-26 ENCOUNTER — Other Ambulatory Visit: Payer: Self-pay | Admitting: Family Medicine

## 2016-03-29 ENCOUNTER — Ambulatory Visit: Payer: Medicare Other | Admitting: Pain Medicine

## 2016-06-19 ENCOUNTER — Other Ambulatory Visit: Payer: Self-pay | Admitting: Internal Medicine

## 2016-06-19 DIAGNOSIS — Z1231 Encounter for screening mammogram for malignant neoplasm of breast: Secondary | ICD-10-CM

## 2016-07-13 ENCOUNTER — Ambulatory Visit: Admission: RE | Admit: 2016-07-13 | Payer: Medicare Other | Source: Ambulatory Visit

## 2016-09-24 ENCOUNTER — Ambulatory Visit: Payer: Medicare Other | Attending: Internal Medicine

## 2016-10-19 ENCOUNTER — Encounter: Payer: Self-pay | Admitting: Urgent Care

## 2016-10-19 ENCOUNTER — Emergency Department: Payer: Medicare Other

## 2016-10-19 ENCOUNTER — Emergency Department
Admission: EM | Admit: 2016-10-19 | Discharge: 2016-10-19 | Disposition: A | Discharge status: Home / Self Care | Payer: Medicare Other | Attending: Student in an Organized Health Care Education/Training Program | Admitting: Student in an Organized Health Care Education/Training Program

## 2016-10-19 DIAGNOSIS — J45909 Unspecified asthma, uncomplicated: Secondary | ICD-10-CM | POA: Insufficient documentation

## 2016-10-19 DIAGNOSIS — I10 Essential (primary) hypertension: Secondary | ICD-10-CM | POA: Diagnosis not present

## 2016-10-19 DIAGNOSIS — Z79899 Other long term (current) drug therapy: Secondary | ICD-10-CM | POA: Diagnosis not present

## 2016-10-19 DIAGNOSIS — Z7982 Long term (current) use of aspirin: Secondary | ICD-10-CM | POA: Diagnosis not present

## 2016-10-19 DIAGNOSIS — K5900 Constipation, unspecified: Secondary | ICD-10-CM

## 2016-10-19 LAB — BASIC METABOLIC PANEL
ANION GAP: 7 (ref 5–15)
BUN: 10 mg/dL (ref 6–20)
CALCIUM: 8.9 mg/dL (ref 8.9–10.3)
CO2: 24 mmol/L (ref 22–32)
Chloride: 108 mmol/L (ref 101–111)
Creatinine, Ser: 0.95 mg/dL (ref 0.44–1.00)
Glucose, Bld: 92 mg/dL (ref 65–99)
Potassium: 4.6 mmol/L (ref 3.5–5.1)
Sodium: 139 mmol/L (ref 135–145)

## 2016-10-19 LAB — CBC WITH DIFFERENTIAL/PLATELET
Basophils Absolute: 0 10*3/uL (ref 0–0.1)
Basophils Relative: 1 %
Eosinophils Absolute: 0.1 10*3/uL (ref 0–0.7)
Eosinophils Relative: 2 %
HCT: 38.5 % (ref 35.0–47.0)
Hemoglobin: 12.6 g/dL (ref 12.0–16.0)
Lymphocytes Relative: 28 %
Lymphs Abs: 2.2 10*3/uL (ref 1.0–3.6)
MCH: 27.1 pg (ref 26.0–34.0)
MCHC: 32.8 g/dL (ref 32.0–36.0)
MCV: 82.5 fL (ref 80.0–100.0)
Monocytes Absolute: 0.5 10*3/uL (ref 0.2–0.9)
Monocytes Relative: 7 %
Neutro Abs: 4.9 10*3/uL (ref 1.4–6.5)
Neutrophils Relative %: 62 %
Platelets: 249 10*3/uL (ref 150–440)
RBC: 4.67 MIL/uL (ref 3.80–5.20)
RDW: 15.2 % — ABNORMAL HIGH (ref 11.5–14.5)
WBC: 7.8 10*3/uL (ref 3.6–11.0)

## 2016-10-19 MED ORDER — IOPAMIDOL (ISOVUE-300) INJECTION 61%
30.0000 mL | Freq: Once | INTRAVENOUS | Status: AC | PRN
  Administered 2016-10-19: 30 mL via ORAL

## 2016-10-19 MED ORDER — POLYETHYLENE GLYCOL 3350 17 G PO PACK: 17.0000 g | PACK | Freq: Every day | ORAL | 0 refills | Status: AC

## 2016-10-19 MED ORDER — MAGNESIUM CITRATE PO SOLN
1.0000 | Freq: Once | ORAL | Status: AC
  Administered 2016-10-19: 1 via ORAL
  Filled 2016-10-19: qty 296

## 2016-10-19 NOTE — ED Triage Notes (Signed)
Patient presents with difficulty passing stools for "several months". Patient using Senna every other day, often doubling up on doses, however patient reporting that her bowels are not moving right. Patient advising that her stools are moving, but she has the sensation that "all of it is not coming out". Patient reports a feeling of fullness in the rectal vault when she coughs. Reports that stools that are passed are not hard.

## 2016-10-19 NOTE — ED Provider Notes (Signed)
[facility] Emergency Department Provider Note    First MD Initiated Contact with Patient 10/19/16 [phone]     (approximate)  I have reviewed the triage vital signs and the nursing notes.   HISTORY  Chief Complaint Constipation    HPI Kirsten [name] is a 70 y.o. female presents with chief complaint of difficulty passing her stools for the past several months. Denies any abdominal pain. Denies any recent fevers. No nausea or vomiting. Has been taking senna nightly without improvement. Patient states that when she coughs she feels like she is having a sensation of fullness in her rectum. Denies any dysuria.   Past Medical History:  Diagnosis Date  . Allergy   . Arthritis   . Depression   . Heart disease   . History of blood clots   . Hyperlipidemia   . Hypertension   . Migraine headache    Family History  Problem Relation Age of Onset  . Cancer Father    Past Surgical History:  Procedure Laterality Date  . ABDOMINAL HYSTERECTOMY    . APPENDECTOMY    . BOWEL RESECTION     Patient Active Problem List   Diagnosis Date Noted  . Clinical depression 11/18/2015  . Cephalalgia 11/18/2015  . Amnesia 11/18/2015  . Headache, migraine 11/18/2015  . Seizure (HCC) 11/18/2015  . Chronic hip pain 11/18/2015  . Hypertension 09/20/2015  . Hyperlipidemia 09/20/2015  . Chronic pain 09/20/2015  . Heart murmur 09/20/2015  . Asthma 09/20/2015  . Restless leg 10/05/2014  . Concussion with coma 08/30/2014      Prior to Admission medications   Medication Sig Start Date End Date Taking? Authorizing Provider  albuterol (PROVENTIL HFA) 108 (90 BASE) MCG/ACT inhaler Inhale 2 puffs into the lungs every 6 (six) hours as needed for wheezing or shortness of breath.    Historical Provider, MD  amLODipine-benazepril (LOTREL) 10-20 MG capsule TK ONE C PO  QD 09/08/15   Historical Provider, MD  aspirin EC 81 MG tablet Take 1 tablet (81 mg total) by mouth daily. 09/20/15    Amy Rusty AusLauren Krebs, NP  atorvastatin (LIPITOR) 20 MG tablet Take 20 mg by mouth daily.    Historical Provider, MD  dextromethorphan-guaiFENesin (MUCINEX DM) 30-600 MG 12hr tablet Take 1 tablet by mouth 2 (two) times daily. 12/21/15   Amy Lauren Krebs, NP  fluticasone (FLONASE) 50 MCG/ACT nasal spray SHAKE LIQUID AND USE 2 SPRAYS IN EACH NOSTRIL DAILY 12/21/15   Amy Lauren Krebs, NP  gabapentin (NEURONTIN) 100 MG capsule TAKE 1 CAPSULE(100 MG) BY MOUTH THREE TIMES DAILY 03/27/16   Amy Rusty AusLauren Krebs, NP  polyethylene glycol (MIRALAX / GLYCOLAX) packet Take 17 g by mouth daily. Mix one tablespoon with 8oz of your favorite juice or water every day until you are having soft formed stools. Then start taking once daily if you didn't have a stool the day before. 10/19/16 11/18/16  Willy EddyPatrick Ashlin Hidalgo, MD  traMADol-acetaminophen (ULTRACET) 37.5-325 MG tablet Take 1 tablet by mouth every 12 (twelve) hours. 01/30/16   Amy Rusty AusLauren Krebs, NP    Allergies Patient has no known allergies.    Social History Social History  Substance Use Topics  . Smoking status: Never Smoker  . Smokeless tobacco: Never Used  . Alcohol use No    Review of Systems Patient denies headaches, rhinorrhea, blurry vision, numbness, shortness of breath, chest pain, edema, cough, abdominal pain, nausea, vomiting, diarrhea, dysuria, fevers, rashes or hallucinations unless otherwise stated above in HPI.  ____________________________________________   PHYSICAL EXAM:  VITAL SIGNS: Vitals:   10/19/16 0336  BP: (!) 146/85  Pulse: 62  Resp: 14  Temp: 98.4 F (36.9 C)    Constitutional: Alert and oriented. Well appearing and in no acute distress. Eyes: Conjunctivae are normal. PERRL. EOMI. Head: Atraumatic. Nose: No congestion/rhinnorhea. Mouth/Throat: Mucous membranes are moist.  Oropharynx non-erythematous. Neck: No stridor. Painless ROM. No cervical spine tenderness to palpation Hematological/Lymphatic/Immunilogical: No cervical  lymphadenopathy. Cardiovascular: Normal rate, regular rhythm. Grossly normal heart sounds.  Good peripheral circulation. Respiratory: Normal respiratory effort.  No retractions. Lungs CTAB. Gastrointestinal: Soft and nontender. No distention. No abdominal bruits. No CVA tenderness. Genitourinary: no stool impaction evident, no procitis or rectal mass Musculoskeletal: No lower extremity tenderness nor edema.  No joint effusions. Neurologic:  Normal speech and language. No gross focal neurologic deficits are appreciated. No gait instability. Skin:  Skin is warm, dry and intact. No rash noted.  ____________________________________________   LABS (all labs ordered are listed, but only abnormal results are displayed)  Results for orders placed or performed during the hospital encounter of 10/19/16 (from the past 24 hour(s))  CBC with Differential/Platelet     Status: Abnormal   Collection Time: 10/19/16  4:29 AM  Result Value Ref Range   WBC 7.8 3.6 - 11.0 K/uL   RBC 4.67 3.80 - 5.20 MIL/uL   Hemoglobin 12.6 12.0 - 16.0 g/dL   HCT 16.1 09.6 - 04.5 %   MCV 82.5 80.0 - 100.0 fL   MCH 27.1 26.0 - 34.0 pg   MCHC 32.8 32.0 - 36.0 g/dL   RDW 40.9 (H) 81.1 - 91.4 %   Platelets 249 150 - 440 K/uL   Neutrophils Relative % 62 %   Neutro Abs 4.9 1.4 - 6.5 K/uL   Lymphocytes Relative 28 %   Lymphs Abs 2.2 1.0 - 3.6 K/uL   Monocytes Relative 7 %   Monocytes Absolute 0.5 0.2 - 0.9 K/uL   Eosinophils Relative 2 %   Eosinophils Absolute 0.1 0 - 0.7 K/uL   Basophils Relative 1 %   Basophils Absolute 0.0 0 - 0.1 K/uL  Basic metabolic panel     Status: None   Collection Time: 10/19/16  4:29 AM  Result Value Ref Range   Sodium 139 135 - 145 mmol/L   Potassium 4.6 3.5 - 5.1 mmol/L   Chloride 108 101 - 111 mmol/L   CO2 24 22 - 32 mmol/L   Glucose, Bld 92 65 - 99 mg/dL   BUN 10 6 - 20 mg/dL   Creatinine, Ser 7.82 0.44 - 1.00 mg/dL   Calcium 8.9 8.9 - 95.6 mg/dL   GFR calc non Af Amer >60 >60  mL/min   GFR calc Af Amer >60 >60 mL/min   Anion gap 7 5 - 15   ____________________________________________ ____________________________________________  RADIOLOGY  I personally reviewed all radiographic images ordered to evaluate for the above acute complaints and reviewed radiology reports and findings.  These findings were personally discussed with the patient.  Please see medical record for radiology report.  ____________________________________________   PROCEDURES  Procedure(s) performed: none Procedures    Critical Care performed: no ____________________________________________   INITIAL IMPRESSION / ASSESSMENT AND PLAN / ED COURSE  Pertinent labs & imaging results that were available during my care of the patient were reviewed by me and considered in my medical decision making (see chart for details).  DDX: constipation, obstruction, obstipation  Kirsten [name] is a 70 y.o. who  presents to the ED with constipation. Patient otherwise afebrile and hemodynamic stable. She denies any abdominal pain. We will check x-ray to evaluate for any evidence of obstructive process and perform rectal exam due to concern for possible fecal impaction.  Clinical Course as of Oct 19 618  Fri Oct 19, 2016  0422 Rectal exam without fecal impaction, no hemorrhoids.  Will order CT imaging to evaluate for any mass or obstructive process.  [PR]  [phone] Lab work is reassuring.  CT abd without evidence of acute pathology. Patient was able to tolerate PO and was able to ambulate with a steady gait.  Have discussed with the patient and available family all diagnostics and treatments performed thus far and all questions were answered to the best of my ability. The patient demonstrates understanding and agreement with plan.  Will DC with miralax and follow up with PCP.  [PR]    Clinical Course User Index [PR] Willy EddyPatrick Melisse Caetano, MD     ____________________________________________   FINAL  CLINICAL IMPRESSION(S) / ED DIAGNOSES  Final diagnoses:  Constipation, unspecified constipation type      NEW MEDICATIONS STARTED DURING THIS VISIT:  New Prescriptions   POLYETHYLENE GLYCOL (MIRALAX / GLYCOLAX) PACKET    Take 17 g by mouth daily. Mix one tablespoon with 8oz of your favorite juice or water every day until you are having soft formed stools. Then start taking once daily if you didn't have a stool the day before.     Note:  This document was prepared using Dragon voice recognition software and may include unintentional dictation errors.    Willy EddyPatrick Kileigh Ortmann, MD 10/19/16 [phone]

## 2016-10-19 NOTE — ED Notes (Signed)
Pt discharged to home.  Discharge instructions reviewed.  Verbalized understanding.  No questions or concerns at this time.  Teach back verified.  Pt in NAD.  No items left in ED.   

## 2016-12-26 ENCOUNTER — Ambulatory Visit (INDEPENDENT_AMBULATORY_CARE_PROVIDER_SITE_OTHER): Payer: Medicare Other | Admitting: Family Medicine

## 2016-12-26 ENCOUNTER — Encounter: Payer: Self-pay | Admitting: Family Medicine

## 2016-12-26 VITALS — BP 138/83 | HR 72 | Temp 98.6°F | Resp 16 | Ht 61.0 in | Wt 118.0 lb

## 2016-12-26 DIAGNOSIS — Z9049 Acquired absence of other specified parts of digestive tract: Secondary | ICD-10-CM

## 2016-12-26 DIAGNOSIS — Z9289 Personal history of other medical treatment: Secondary | ICD-10-CM

## 2016-12-26 DIAGNOSIS — K5900 Constipation, unspecified: Secondary | ICD-10-CM

## 2016-12-26 MED ORDER — POLYETHYLENE GLYCOL 3350 17 GM/SCOOP PO POWD: 17.0000 g | Freq: Two times a day (BID) | ORAL | 2 refills | Status: DC | PRN

## 2016-12-26 NOTE — Assessment & Plan Note (Signed)
Likely functional constipation based on history, seems some triggers with poor dietary (inadequate hydration but seems to get good fiber in diet), otherwise no recent med changes as etiology. - No GI red flag symptoms, benign abdominal exam today, still moving bowels but seems inadequate per patient - Unclear colonoscopy status, per chart review self reported normal colonoscopy approx age 71, but no record available - Reviewed KUB and CT per ED 10/19/16  Plan: 1. Stop Colace, Amitiza. Resume trial again on miralax, but now titrate dose up, can increase to 17g (1 capful) BID, titrate up or down as needed based on BMs 2. Improve water hydration, fiber in diet 3. May continue PRN senna 4. No further imaging at this time 5. Follow-up with her planned scheduling of colonoscopy, if needs new referral can notify our office, as this is likely next step in her work-up for constipation or new bowel changes, if worsening or other concerns can get actual referral to GI, again notify office for this order if needed

## 2016-12-26 NOTE — Progress Notes (Signed)
Subjective:    Patient ID: Kirsten [name], female    DOB: 11-09-1945, 71 y.o.   MRN: [id number]  Kirsten [name] is a 71 y.o. female presenting on 12/26/2016 for Constipation  Patient seen by Bjorn Pippin here at [facility] since 2016. Additionally patient has been seen in interval 2017 at [facility] (Dr Yehuda Mao), most recently in 10/2016 for this same issue with constipation. Patient aware that she should only follow with one PCP.  HPI  CONSTIPATION: - Reports no significant chronic problem with constipation, previously by report had normal bowel movements without hard or dry stool, usually every other day. She does have significant history of multiple abdominal surgeries, including s/p bowel surgery for obstruction when she was teenager approx 50+ years ago, also s/p hysterectomy, appendectomy. But regardless has not had significant GI or abdominal problems since. - Now with new concern of recent constipation over past 3 months. Onset in 09/2016, she tried OTC meds with mixed results, with Colace 100mg  nightly (no major relief), tried Senna x 2 pills night every other day with good relief sometimes too loose, then seen in [place] ED 10/19/16, had labs and work-up with X-ray KUB showed some moderate stool without obstruction, CT Abd/Pelvis obtained unremarkable without acute finding. Treated with Miralax 17mg  daily, tried this for several days with some mixed results but not greatly improved. - She has seen other internal medicine doctor in interval, Dr Ellsworth Lennox, given Amitiza 1mg  BID without good relief either, stopped taking this - Currently describes still constipation with some harder stools, not able to go daily unless takes medications, concern with feels "difficulty completely emptying bowels" - Initially stated she has never had colonoscopy, per record she had a normal colonoscopy at age 69, about 8 years ago, and she has attempted to schedule a repeat colonoscopy, but unsure  where and awaiting appointment - Denies any fevers, chills, unintentional weight loss, nausea, vomiting, abdominal pain, rectal bleeding or dark stools, rectal pain  Social History  Substance Use Topics  . Smoking status: Never Smoker  . Smokeless tobacco: Never Used  . Alcohol use No    Review of Systems Per HPI unless specifically indicated above     Objective:    BP 138/83 (BP Location: Left Arm, Cuff Size: Normal)   Pulse 72   Temp 98.6 F (37 C) (Oral)   Resp 16   Ht 5\' 1"  (1.549 m)   Wt 118 lb (53.5 kg)   BMI 22.30 kg/m   Wt Readings from Last 3 Encounters:  12/26/16 118 lb (53.5 kg)  10/19/16 110 lb (49.9 kg)  01/30/16 112 lb 9.6 oz (51.1 kg)    Physical Exam  Constitutional: She appears well-developed and well-nourished. No distress.  Well-appearing, comfortable, cooperative  HENT:  Head: Normocephalic and atraumatic.  Mouth/Throat: Oropharynx is clear and moist.  Cardiovascular: Normal rate, regular rhythm, normal heart sounds and intact distal pulses.   No murmur heard. Pulmonary/Chest: Effort normal.  Abdominal: Soft. Bowel sounds are normal. She exhibits no distension and no mass. There is no tenderness. There is no rebound and no guarding.  Evidence of old well healed abdominal surgical incision scar tissue, peri-umbilical  No focal tenderness RLQ or LLQ.  Musculoskeletal: She exhibits no edema.  Skin: Skin is warm and dry. No rash noted. She is not diaphoretic. No erythema.  Psychiatric: Her behavior is normal.  Nursing note and vitals reviewed.    I have personally reviewed the radiology report from CT Abdomen/Pelvis  on 10/19/16  CLINICAL DATA:  71 year old female with constipation and difficulty passing stool. Evaluate for obstruction or mass.  EXAM: CT ABDOMEN AND PELVIS WITHOUT CONTRAST  TECHNIQUE: Multidetector CT imaging of the abdomen and pelvis was performed following the standard protocol without IV contrast.  COMPARISON:   Abdominal radiograph dated 10/19/2016 and CT dated 07/17/2014  FINDINGS: Evaluation of this exam is limited in the absence of intravenous contrast.  Lower chest: The visualized lung bases are clear.  No intra-abdominal free air or free fluid.  Hepatobiliary: No focal liver abnormality is seen. No gallstones, gallbladder wall thickening, or biliary dilatation.  Pancreas: Unremarkable. No pancreatic ductal dilatation or surrounding inflammatory changes.  Spleen: Normal in size without focal abnormality.  Adrenals/Urinary Tract: Adrenal glands are unremarkable. Kidneys are normal, without renal calculi, focal lesion, or hydronephrosis. Bladder is unremarkable.  Stomach/Bowel: Oral contrast opacifies multiple loops of small bowel. There is no evidence of bowel obstruction or active inflammation. There is no significant colonic stool burden. Normal appendix.  Vascular/Lymphatic: There is mild aortoiliac atherosclerotic disease. The abdominal aorta and IVC are grossly unremarkable on this noncontrast study. No portal venous gas identified. There is no adenopathy.  Reproductive: Hysterectomy.  Other: Ventral hernia repair surgical wires.  Musculoskeletal: There is degenerative changes and endplate irregularity at L4-L5. There is grade 1 L4-L5 anterolisthesis. No acute osseous pathology identified.  IMPRESSION: No acute intra-abdominopelvic pathology. No evidence of bowel obstruction or active inflammation. Normal appendix. No significant colonic stool burden.   Electronically Signed   By: Elgie CollardArash  Radparvar M.D.   On: 10/19/2016 06:09  I have personally reviewed the following lab results from 10/19/16.  Results for orders placed or performed during the hospital encounter of 10/19/16  CBC with Differential/Platelet  Result Value Ref Range   WBC 7.8 3.6 - 11.0 K/uL   RBC 4.67 3.80 - 5.20 MIL/uL   Hemoglobin 12.6 12.0 - 16.0 g/dL   HCT 16.138.5 09.635.0 - 04.547.0 %     MCV 82.5 80.0 - 100.0 fL   MCH 27.1 26.0 - 34.0 pg   MCHC 32.8 32.0 - 36.0 g/dL   RDW 40.915.2 (H) 81.111.5 - 91.414.5 %   Platelets 249 150 - 440 K/uL   Neutrophils Relative % 62 %   Neutro Abs 4.9 1.4 - 6.5 K/uL   Lymphocytes Relative 28 %   Lymphs Abs 2.2 1.0 - 3.6 K/uL   Monocytes Relative 7 %   Monocytes Absolute 0.5 0.2 - 0.9 K/uL   Eosinophils Relative 2 %   Eosinophils Absolute 0.1 0 - 0.7 K/uL   Basophils Relative 1 %   Basophils Absolute 0.0 0 - 0.1 K/uL  Basic metabolic panel  Result Value Ref Range   Sodium 139 135 - 145 mmol/L   Potassium 4.6 3.5 - 5.1 mmol/L   Chloride 108 101 - 111 mmol/L   CO2 24 22 - 32 mmol/L   Glucose, Bld 92 65 - 99 mg/dL   BUN 10 6 - 20 mg/dL   Creatinine, Ser 7.820.95 0.44 - 1.00 mg/dL   Calcium 8.9 8.9 - 95.610.3 mg/dL   GFR calc non Af Amer >60 >60 mL/min   GFR calc Af Amer >60 >60 mL/min   Anion gap 7 5 - 15      Assessment & Plan:   Problem List Items Addressed This Visit    History of bowel resection    Concern with prior bowel resection and other abdominal surgeries, likely contributing to some abdominal adhesions or scar  tissue increasing risk for bowel obstruction, however clinically absolutely no evidence of this today, still moving bowels, benign abdomen, and active bowel sounds. - See A&P above      Constipation - Primary    Likely functional constipation based on history, seems some triggers with poor dietary (inadequate hydration but seems to get good fiber in diet), otherwise no recent med changes as etiology. - No GI red flag symptoms, benign abdominal exam today, still moving bowels but seems inadequate per patient - Unclear colonoscopy status, per chart review self reported normal colonoscopy approx age 25, but no record available - Reviewed KUB and CT per ED 10/19/16  Plan: 1. Stop Colace, Amitiza. Resume trial again on miralax, but now titrate dose up, can increase to 17g (1 capful) BID, titrate up or down as needed based on  BMs 2. Improve water hydration, fiber in diet 3. May continue PRN senna 4. No further imaging at this time 5. Follow-up with her planned scheduling of colonoscopy, if needs new referral can notify our office, as this is likely next step in her work-up for constipation or new bowel changes, if worsening or other concerns can get actual referral to GI, again notify office for this order if needed      Relevant Medications   polyethylene glycol powder (GLYCOLAX/MIRALAX) powder      Meds ordered this encounter       . lisinopril-hydrochlorothiazide (PRINZIDE,ZESTORETIC) 20-12.5 MG tablet  . polyethylene glycol powder (GLYCOLAX/MIRALAX) powder    Sig: Take 17 g by mouth 2 (two) times daily as needed for mild constipation or moderate constipation.    Dispense:  250 g    Refill:  2      Follow up plan: Return in about 6 weeks (around 02/06/2017) for constipation.  Saralyn Pilar, DO [facility] 12/26/2016, 5:12 PM

## 2016-12-26 NOTE — Assessment & Plan Note (Signed)
Concern with prior bowel resection and other abdominal surgeries, likely contributing to some abdominal adhesions or scar tissue increasing risk for bowel obstruction, however clinically absolutely no evidence of this today, still moving bowels, benign abdomen, and active bowel sounds. - See A&P above

## 2016-12-26 NOTE — Patient Instructions (Signed)
Thank you for coming in to clinic today.  1. For constipation, start Miralax powder 17g or 1 capful up to TWICE a day for several days to weeks to see if regulating bowel movements. If too strong, watery stools then can reduce dose to ONCE daily, or every other day. If not strong enough can take 2 capfuls in morning and 1 in evening.  - Stop Amitiza - Stop Colace if not helping - Use senna only if needed, and not helped by Miralax  I do not think you have a bowel blockage or any other more serious problem  Recommend colonoscopy as planned, if you can go ahead and schedule this go ahead. And we can follow-up the results.  If you need a new order for colonoscopy, or not improved constipation we can refer you to GI doctor, just notify us if needed  [facility] [address] [place], [place] [address] Phone: [phone]  [facility] [address] [place], [place] [address] Phone: [phone]  Midge Miniumarren Wohl, MD Wyline MoodKiran Anna, MD (accepting new patients)  Please schedule a follow-up appointment with Dr. Althea CharonKaramalegos in 4-6 weeks as needed if not improved constipation  If you have any other questions or concerns, please feel free to call the clinic or send a message through MyChart. You may also schedule an earlier appointment if necessary.  Saralyn PilarAlexander Laquon Emel, DO [facility]

## 2017-02-06 DIAGNOSIS — K5900 Constipation, unspecified: Secondary | ICD-10-CM | POA: Diagnosis not present

## 2017-02-12 ENCOUNTER — Telehealth: Payer: Self-pay | Admitting: Family Medicine

## 2017-02-12 NOTE — Telephone Encounter (Signed)
R/t call to patient gave her [facility] contact information. Notified patient she may call and ask for sooner appt.

## 2017-02-12 NOTE — Telephone Encounter (Signed)
Pt has a colonoscopy scheduled in July that was set up by previous PCP.  She doesn't want to wait that long and asked to have this office schedule one sooner.  Her call back number is [phone]

## 2017-03-01 ENCOUNTER — Encounter: Payer: Self-pay | Admitting: Family Medicine

## 2017-03-01 ENCOUNTER — Ambulatory Visit (INDEPENDENT_AMBULATORY_CARE_PROVIDER_SITE_OTHER): Payer: Medicare Other | Admitting: Family Medicine

## 2017-03-01 VITALS — BP 156/79 | HR 73 | Temp 98.5°F | Resp 16 | Ht 61.0 in | Wt 123.0 lb

## 2017-03-01 DIAGNOSIS — J4520 Mild intermittent asthma, uncomplicated: Secondary | ICD-10-CM | POA: Diagnosis not present

## 2017-03-01 DIAGNOSIS — J3089 Other allergic rhinitis: Secondary | ICD-10-CM

## 2017-03-01 DIAGNOSIS — J309 Allergic rhinitis, unspecified: Secondary | ICD-10-CM | POA: Insufficient documentation

## 2017-03-01 DIAGNOSIS — I10 Essential (primary) hypertension: Secondary | ICD-10-CM

## 2017-03-01 MED ORDER — AMLODIPINE BESYLATE 10 MG PO TABS
10.0000 mg | ORAL_TABLET | Freq: Every day | ORAL | 3 refills | Status: DC
Start: 2017-03-01 — End: 2017-04-10

## 2017-03-01 MED ORDER — LORATADINE 10 MG PO TABS: 10.0000 mg | ORAL_TABLET | Freq: Every day | ORAL | 11 refills | Status: DC

## 2017-03-01 MED ORDER — PREDNISONE 50 MG PO TABS: 50.0000 mg | ORAL_TABLET | Freq: Every day | ORAL | 0 refills | Status: DC

## 2017-03-01 NOTE — Assessment & Plan Note (Signed)
Consistent with mild acute asthma exacerbation given reported history, likely triggered by recent 1-2 month flu vs viral illness, now with lingering coughing spells - Currently without active wheezing, but worse at night with night-time awakening - Inappropriate daily use of Albuterol. Not on maintenance. Do not have data from prior spirometry - No evidence of acute infection PNA / Sinusitis / Bronchitis - No recent asthma exac or hospitalizations or recent prednisone, has been well controlled  Plan: 1. Start Prednisone burst  daily x 5 days 2. Continue Albuterol 2 puffs q 4-6 hour PRN wheezing/cough/SOB x 3 days regularly, then PRN 3. May use home Duoneb PRN 4. Consider future CXR, antibiotics if not responding to prednisone 5. Continue allergy treatment with Flonase, and start Loratadine OTC daily, Mucinex-DM 1 week 6. Return criteria given to follow-up vs when to go to ED - in future may need spirometry or daily maintenance ICS inhaler

## 2017-03-01 NOTE — Patient Instructions (Addendum)
Thank you for coming in to clinic today.  1. It sounds like you have an asthma flare after the flu, the cough may take a few weeks to fully resolve.  Start Prednisone  daily for next 5 days - this will open up lungs allow you to breath better and treat that wheezing or bronchospasm - Use Albuterol inhaler 2 puffs every 4-6 hours around the clock for next 2-3 days, max up to 5 days then use as needed, should not use everyday in the future - Use nebulizer machine if needed - Start OTC Loratadine (Claritin)  daily - COntinue Flonase 2 sprays in each nostril daily for next 4-6 weeks, then you may stop and use seasonally or as needed - Start OTC Mucinex-DM for 1 week or less, to help clear the mucus - Drink plenty of fluids to improve congestion  - IF not improving by next week, or develop worsening fever, productive cough then start Azithromycin Z pak (antibiotic) 2 tabs day 1, then 1 tab x 4 days, complete entire course even if improved also can return to check Chest X-ray  Start new blood pressure pill once daily. - Check BP outside of office at pharmacy, write down readings - Bring meds to next visit  If your symptoms seem to worsen instead of improve over next several days, including significant fever / chills, worsening shortness of breath, worsening wheezing, or nausea / vomiting and can't take medicines - return sooner or go to hospital Emergency Department for more immediate treatment.  Please schedule a follow-up appointment with Dr. Althea Charon in 1-2 weeks if not improved asthma / coughing  Follow-up 3 months for HTN  If you have any other questions or concerns, please feel free to call the clinic or send a message through MyChart. You may also schedule an earlier appointment if necessary.  Saralyn Pilar, DO [facility], [place]

## 2017-03-01 NOTE — Progress Notes (Signed)
Subjective:    Patient ID: Kirsten [name], female    DOB: 1946-10-27, 71 y.o.   MRN: [id number]  Kirsten [name] is a 71 y.o. female presenting on 03/01/2017 for Cough (onset several months as per pt)   HPI   COUGH, Chronic Asthma / Allergic Rhinitis: - Reports symptoms started about 2 months ago, states she had "the flu" not confirmed or diagnosed or treated, she improved but has had persistent lingering cough with some productive sputum, not yellow or colored, affecting her breathing at times, feels like wheezing sometimes - Has history of asthma, chronic problem. Uses albuterol inhaler daily with minimal relief. Also has a nebulizer at home with duoneb some mild improvement, infrequently using - Taking Flonase regularly - Admits waking up at night difficulty breathing spells at times. Also admits some mild dyspnea with acute coughing spells. - Admits some history of allergies, with some rhinorrhea and nasal congestion - No history of CHF. - Denies any chest pain or pressure, shortness of breath with activity or at rest, nausea, vomiting, leg swelling  CHRONIC HTN: Reports states she has not taken her anti-HTN meds for >6 months. She stopped taking them after they were changed by previous PCP in 2017, gave her "headaches". Unsure exactly which one, she describes taking Benazepril, and the combo pill, however has Lisinopril-HCTZ and Amlodipine-Benzaperil both listed as historical medications. - not checking BP outside office currently Current Meds - none, off meds non adherence   Denies CP, dyspnea, HA, edema, dizziness / lightheadedness  Social History  Substance Use Topics  . Smoking status: Never Smoker  . Smokeless tobacco: Never Used  . Alcohol use No    Review of Systems Per HPI unless specifically indicated above     Objective:    BP (!) 156/79   Pulse 73   Temp 98.5 F (36.9 C) (Oral)   Resp 16   Ht  (1.549 m)   Wt 123 lb (55.8 kg)   SpO2 99%   BMI 23.24  kg/m   Wt Readings from Last 3 Encounters:  03/01/17 123 lb (55.8 kg)  12/26/16 118 lb (53.5 kg)  10/19/16 110 lb (49.9 kg)    Physical Exam  Constitutional: She is oriented to person, place, and time. She appears well-developed and well-nourished. No distress.  Well-appearing, comfortable, cooperative, pleasant  HENT:  Head: Normocephalic and atraumatic.  Mouth/Throat: Oropharynx is clear and moist.  Frontal / maxillary sinuses non-tender. Nares patent without purulence or edema. Bilateral TMs clear without erythema, effusion or bulging. Oropharynx clear without erythema, exudates, edema or asymmetry.  Eyes: Conjunctivae are normal. Right eye exhibits no discharge. Left eye exhibits no discharge.  Neck: Normal range of motion. Neck supple.  Cardiovascular: Normal rate, regular rhythm, normal heart sounds and intact distal pulses.   No murmur heard. Pulmonary/Chest: Effort normal and breath sounds normal. No respiratory distress. She has no wheezes. She has no rales.  Very slightly reduced air movement diffusely. Speaks full sentences.  Musculoskeletal: She exhibits no edema.  Lymphadenopathy:    She has no cervical adenopathy.  Neurological: She is alert and oriented to person, place, and time.  Skin: Skin is warm and dry. No rash noted. She is not diaphoretic. No erythema.  Psychiatric: She has a normal mood and affect. Her behavior is normal.  Nursing note and vitals reviewed.  Results for orders placed or performed during the hospital encounter of 10/19/16  CBC with Differential/Platelet  Result Value Ref Range  WBC 7.8 3.6 - 11.0 K/uL   RBC 4.67 3.80 - 5.20 MIL/uL   Hemoglobin 12.6 12.0 - 16.0 g/dL   HCT 16.1 09.6 - 04.5 %   MCV 82.5 80.0 - 100.0 fL   MCH 27.1 26.0 - 34.0 pg   MCHC 32.8 32.0 - 36.0 g/dL   RDW 40.9 (H) 81.1 - 91.4 %   Platelets 249 150 - 440 K/uL   Neutrophils Relative % 62 %   Neutro Abs 4.9 1.4 - 6.5 K/uL   Lymphocytes Relative 28 %   Lymphs Abs 2.2  1.0 - 3.6 K/uL   Monocytes Relative 7 %   Monocytes Absolute 0.5 0.2 - 0.9 K/uL   Eosinophils Relative 2 %   Eosinophils Absolute 0.1 0 - 0.7 K/uL   Basophils Relative 1 %   Basophils Absolute 0.0 0 - 0.1 K/uL  Basic metabolic panel  Result Value Ref Range   Sodium 139 135 - 145 mmol/L   Potassium 4.6 3.5 - 5.1 mmol/L   Chloride 108 101 - 111 mmol/L   CO2 24 22 - 32 mmol/L   Glucose, Bld 92 65 - 99 mg/dL   BUN 10 6 - 20 mg/dL   Creatinine, Ser 7.82 0.44 - 1.00 mg/dL   Calcium 8.9 8.9 - 95.6 mg/dL   GFR calc non Af Amer >60 >60 mL/min   GFR calc Af Amer >60 >60 mL/min   Anion gap 7 5 - 15      Assessment & Plan:   Problem List Items Addressed This Visit    Hypertension    Elevated BP today, non adherent to medications, due to possible side effect HA - Prior meds ACEi, Amlodipine, HCTZ, has persistent cough now, unclear if ACEi Cough vs Asthma as well, clinically less likely ACEi by report  Plan: 1. Discontinue existing BP meds (she is out of them regardless) - start new med Amlodipine  daily, she was on this before with Lotrel, counseling on potential side effect swelling 2. Not due for repeat labs soon - last 10/2016, normal electrolytes and Cr 3. Monitor BP outside office, write down readings, bring to next apt 4. Follow-up 3 months for HTN - adjust meds, consider future ARB, to avoid ACEi cough, but currently not indication for required ACEi/ARB      Relevant Medications   amLODipine (NORVASC) 10 MG tablet   Asthma without status asthmaticus - Primary    Consistent with mild acute asthma exacerbation given reported history, likely triggered by recent 1-2 month flu vs viral illness, now with lingering coughing spells - Currently without active wheezing, but worse at night with night-time awakening - Inappropriate daily use of Albuterol. Not on maintenance. Do not have data from prior spirometry - No evidence of acute infection PNA / Sinusitis / Bronchitis - No recent  asthma exac or hospitalizations or recent prednisone, has been well controlled  Plan: 1. Start Prednisone burst  daily x 5 days 2. Continue Albuterol 2 puffs q 4-6 hour PRN wheezing/cough/SOB x 3 days regularly, then PRN 3. May use home Duoneb PRN 4. Consider future CXR, antibiotics if not responding to prednisone 5. Continue allergy treatment with Flonase, and start Loratadine OTC daily, Mucinex-DM 1 week 6. Return criteria given to follow-up vs when to go to ED - in future may need spirometry or daily maintenance ICS inhaler      Relevant Medications   predniSONE (DELTASONE) 50 MG tablet   Allergic rhinitis due to allergen    Likely  contributing to lingering cough / asthma - Continue Flonase - Add Loratadine      Relevant Medications   loratadine (CLARITIN) 10 MG tablet       Current Outpatient Prescriptions:  .  albuterol (PROVENTIL HFA) 108 (90 BASE) MCG/ACT inhaler, Inhale 2 puffs into the lungs every 6 (six) hours as needed for wheezing or shortness of breath., Disp: , Rfl:  .  aspirin EC 81 MG tablet, Take 1 tablet (81 mg total) by mouth daily., Disp: , Rfl:  .  atorvastatin (LIPITOR) 20 MG tablet, Take 20 mg by mouth daily., Disp: , Rfl:  .  docusate sodium (COLACE) 100 MG capsule, Take by mouth., Disp: , Rfl:  .  fluticasone (FLONASE) 50 MCG/ACT nasal spray, SHAKE LIQUID AND USE 2 SPRAYS IN EACH NOSTRIL DAILY, Disp: 48 g, Rfl: 3 .  gabapentin (NEURONTIN) 100 MG capsule, TAKE 1 CAPSULE(100 MG) BY MOUTH THREE TIMES DAILY, Disp: 90 capsule, Rfl: 3 .  ipratropium-albuterol (DUONEB) 0.5-2.5 (3) MG/3ML SOLN, , Disp: , Rfl:  .  polyethylene glycol powder (GLYCOLAX/MIRALAX) powder, Take 17 g by mouth 2 (two) times daily as needed for mild constipation or moderate constipation., Disp: 250 g, Rfl: 2 .  amLODipine (NORVASC) 10 MG tablet, Take 1 tablet (10 mg total) by mouth daily., Disp: 90 tablet, Rfl: 3 .  loratadine (CLARITIN) 10 MG tablet, Take 1 tablet (10 mg total) by mouth  daily. Use for 4-6 weeks then stop, and use as needed or seasonally, Disp: 30 tablet, Rfl: 11 .  predniSONE (DELTASONE) 50 MG tablet, Take 1 tablet (50 mg total) by mouth daily with breakfast., Disp: 5 tablet, Rfl: 0  Meds ordered this encounter           . amLODipine (NORVASC) 10 MG tablet    Sig: Take 1 tablet (10 mg total) by mouth daily.    Dispense:  90 tablet    Refill:  3  . predniSONE (DELTASONE) 50 MG tablet    Sig: Take 1 tablet (50 mg total) by mouth daily with breakfast.    Dispense:  5 tablet    Refill:  0  . loratadine (CLARITIN) 10 MG tablet    Sig: Take 1 tablet (10 mg total) by mouth daily. Use for 4-6 weeks then stop, and use as needed or seasonally    Dispense:  30 tablet    Refill:  11      Follow up plan: Return in about 3 months (around 05/31/2017) for blood pressure.  Saralyn Pilar, DO [facility] 03/01/2017, 2:03 PM

## 2017-03-01 NOTE — Assessment & Plan Note (Signed)
Elevated BP today, non adherent to medications, due to possible side effect HA - Prior meds ACEi, Amlodipine, HCTZ, has persistent cough now, unclear if ACEi Cough vs Asthma as well, clinically less likely ACEi by report  Plan: 1. Discontinue existing BP meds (she is out of them regardless) - start new med Amlodipine  daily, she was on this before with Lotrel, counseling on potential side effect swelling 2. Not due for repeat labs soon - last 10/2016, normal electrolytes and Cr 3. Monitor BP outside office, write down readings, bring to next apt 4. Follow-up 3 months for HTN - adjust meds, consider future ARB, to avoid ACEi cough, but currently not indication for required ACEi/ARB

## 2017-03-01 NOTE — Assessment & Plan Note (Signed)
Likely contributing to lingering cough / asthma - Continue Flonase - Add Loratadine

## 2017-04-09 ENCOUNTER — Telehealth: Payer: Self-pay | Admitting: Family Medicine

## 2017-04-09 ENCOUNTER — Ambulatory Visit (INDEPENDENT_AMBULATORY_CARE_PROVIDER_SITE_OTHER): Payer: Medicare Other

## 2017-04-09 ENCOUNTER — Telehealth: Payer: Self-pay

## 2017-04-09 VITALS — BP 142/82 | HR 68 | Temp 98.4°F | Resp 16 | Ht 61.0 in | Wt 123.8 lb

## 2017-04-09 DIAGNOSIS — Z0000 Encounter for general adult medical examination without abnormal findings: Secondary | ICD-10-CM

## 2017-04-09 DIAGNOSIS — I10 Essential (primary) hypertension: Secondary | ICD-10-CM

## 2017-04-09 NOTE — Progress Notes (Signed)
Subjective:   Kirsten [name] is a 71 y.o. female who presents for an Initial Medicare Annual Wellness Visit.  Review of Systems      Cardiac Risk Factors include: advanced age (>52men, >2 women);hypertension;dyslipidemia     Objective:    Today's Vitals   04/09/17 1022  BP: (!) 142/82  Pulse: 68  Resp: 16  Temp: 98.4 F (36.9 C)  Weight: 123 lb 12.8 oz (56.2 kg)  Height: 5\' 1"  (1.549 m)   Body mass index is 23.39 kg/m.   Current Medications (verified) Outpatient Encounter Prescriptions as of 04/09/2017  Medication Sig  . albuterol (PROVENTIL HFA) 108 (90 BASE) MCG/ACT inhaler Inhale 2 puffs into the lungs every 6 (six) hours as needed for wheezing or shortness of breath.  Marland Kitchen aspirin EC 81 MG tablet Take 1 tablet (81 mg total) by mouth daily.  Marland Kitchen atorvastatin (LIPITOR) 20 MG tablet Take 20 mg by mouth daily.  Marland Kitchen docusate sodium (COLACE) 100 MG capsule Take by mouth daily.   . fluticasone (FLONASE) 50 MCG/ACT nasal spray SHAKE LIQUID AND USE 2 SPRAYS IN EACH NOSTRIL DAILY  . ipratropium-albuterol (DUONEB) 0.5-2.5 (3) MG/3ML SOLN   . amLODipine (NORVASC) 10 MG tablet Take 1 tablet (10 mg total) by mouth daily. (Patient not taking: Reported on 04/09/2017)  . gabapentin (NEURONTIN) 100 MG capsule TAKE 1 CAPSULE(100 MG) BY MOUTH THREE TIMES DAILY (Patient not taking: Reported on 04/09/2017)  . polyethylene glycol powder (GLYCOLAX/MIRALAX) powder Take 17 g by mouth 2 (two) times daily as needed for mild constipation or moderate constipation. (Patient not taking: Reported on 04/09/2017)  . [DISCONTINUED] loratadine (CLARITIN) 10 MG tablet Take 1 tablet (10 mg total) by mouth daily. Use for 4-6 weeks then stop, and use as needed or seasonally (Patient not taking: Reported on 04/09/2017)  . [DISCONTINUED] predniSONE (DELTASONE) 50 MG tablet Take 1 tablet (50 mg total) by mouth daily with breakfast. (Patient not taking: Reported on 04/09/2017)   No facility-administered encounter medications on  file as of 04/09/2017.     Allergies (verified) Patient has no known allergies.   History: Past Medical History:  Diagnosis Date  . Allergy   . Arthritis   . Depression   . Heart disease   . History of blood clots   . Hyperlipidemia   . Hypertension   . Migraine headache    Past Surgical History:  Procedure Laterality Date  . ABDOMINAL HYSTERECTOMY    . APPENDECTOMY    . BOWEL RESECTION     Family History  Problem Relation Age of Onset  . Cancer Father    Social History   Occupational History  . Not on file.   Social History Main Topics  . Smoking status: Never Smoker  . Smokeless tobacco: Never Used  . Alcohol use No  . Drug use: No  . Sexual activity: Not on file    Tobacco Counseling Counseling given: Not Answered   Activities of Daily Living In your present state of health, do you have any difficulty performing the following activities: 04/09/2017 12/26/2016  Hearing? N N  Vision? N N  Difficulty concentrating or making decisions? Y N  Walking or climbing stairs? Y N  Dressing or bathing? N N  Doing errands, shopping? N N  Preparing Food and eating ? N -  Using the Toilet? N -  In the past six months, have you accidently leaked urine? N -  Do you have problems with loss of bowel control? N -  Managing your Medications? N -  Managing your Finances? N -  Housekeeping or managing your Housekeeping? N -  Some recent data might be hidden    Immunizations and Health Maintenance Immunization History  Administered Date(s) Administered  . Influenza, High Dose Seasonal PF 09/20/2015  . Pneumococcal Conjugate-13 08/16/2014  . Pneumococcal Polysaccharide-23 02/10/2014  . Tdap 02/10/2014   There are no preventive care reminders to display for this patient.  Patient Care Team: Kirsten CordsKaramalegos, Alexander J, DO as PCP - General (Family Medicine)  Indicate any recent Medical Services you may have received from other than [facility] providers in the past year (date may  be approximate).     Assessment:   This is a routine wellness examination for Kirsten [name].   Hearing/Vision screen Vision Screening Comments: Sees Kirsten [name]   Dietary issues and exercise activities discussed: Current Exercise Habits: The patient does not participate in regular exercise at present, Exercise limited by: None identified  Goals    . Increase water intake          Recommend drinking at least 3-4 glasses of water a day      Depression Screen PHQ 2/9 Scores 04/09/2017 12/26/2016 12/21/2015 09/20/2015  PHQ - 2 Score 0 0 0 0    Fall Risk Fall Risk  04/09/2017 12/26/2016 12/21/2015 09/20/2015  Falls in the past year? No No No No    Cognitive Function:     6CIT Screen 04/09/2017  What Year? 0 points  What month? 0 points  What time? 0 points  Count back from 20 0 points  Months in reverse 4 points  Repeat phrase 6 points  Total Score 10    Screening Tests Health Maintenance  Topic Date Due  . MAMMOGRAM  01/10/2018 (Originally 09/19/2012)  . INFLUENZA VACCINE  06/05/2017  . COLONOSCOPY  09/05/2024  . TETANUS/TDAP  09/05/2024  . DEXA SCAN  Completed  . Hepatitis C Screening  Completed  . PNA vac Low Risk Adult  Completed      Plan:    I have personally reviewed and addressed the Medicare Annual Wellness questionnaire and have noted the following in the patient's chart:  A. Medical and social history B. Use of alcohol, tobacco or illicit drugs  C. Current medications and supplements D. Functional ability and status E.  Nutritional status F.  Physical activity G. Advance directives H. List of other physicians I.  Hospitalizations, surgeries, and ER visits in previous 12 months [name].  Vitals K. Screenings such as hearing and vision if needed, cognitive and depression L. Referrals and appointments - 05/31/2017 at 1:20pm with Dr.Karamalegos  In addition, I have reviewed and discussed with patient certain preventive protocols, quality metrics, and best practice  recommendations. A written personalized care plan for preventive services as well as general preventive health recommendations were provided to patient.   Signed,  Kirsten Robertsiffany Sekai Nayak, LPN Nurse Health Advisor   MD Recommendations: States amlodipine is causing headaches, she is not taking this everyday.

## 2017-04-09 NOTE — Patient Instructions (Addendum)
Kirsten [name] , Thank you for taking time to come for your Medicare Wellness Visit. I appreciate your ongoing commitment to your health goals. Please review the following plan we discussed and let me know if I can assist you in the future.   Screening recommendations/referrals: Colonoscopy: Completed 09/05/2014, due 09/2024 Mammogram: due now- call to schedule this at your convienence Bone Density: completed 07/06/2014 Recommended yearly ophthalmology/optometry visit for glaucoma screening and checkup Recommended yearly dental visit for hygiene and checkup  Vaccinations: Influenza vaccine: up to date, due 10/2017 Pneumococcal vaccine: completed series Tdap vaccine: up to date Shingles vaccine: due, check with your insurance company for coverage    Advanced directives: Advance directive discussed with you today. I have provided a copy for you to complete at home and have notarized. Once this is complete please bring a copy in to our office so we can scan it into your chart.  Conditions/risks identified:Recommend drinking at least 3-4 glasses of water a day  Next appointment: Follow up with Dr. Althea CharonKaramalegos on 05/31/2017 at 1:20pm. Follow up in one year for your annual wellness exam.    Preventive Care 65 Years and Older, Female Preventive care refers to lifestyle choices and visits with your health care provider that can promote health and wellness. What does preventive care include?  A yearly physical exam. This is also called an annual well check.  Dental exams once or twice a year.  Routine eye exams. Ask your health care provider how often you should have your eyes checked.  Personal lifestyle choices, including:  Daily care of your teeth and gums.  Regular physical activity.  Eating a healthy diet.  Avoiding tobacco and drug use.  Limiting alcohol use.  Practicing safe sex.  Taking low-dose aspirin every day.  Taking vitamin and mineral supplements as recommended by  your health care provider. What happens during an annual well check? The services and screenings done by your health care provider during your annual well check will depend on your age, overall health, lifestyle risk factors, and family history of disease. Counseling  Your health care provider may ask you questions about your:  Alcohol use.  Tobacco use.  Drug use.  Emotional well-being.  Home and relationship well-being.  Sexual activity.  Eating habits.  History of falls.  Memory and ability to understand (cognition).  Work and work Astronomerenvironment.  Reproductive health. Screening  You may have the following tests or measurements:  Height, weight, and BMI.  Blood pressure.  Lipid and cholesterol levels. These may be checked every 5 years, or more frequently if you are over 69 years old.  Skin check.  Lung cancer screening. You may have this screening every year starting at age 69 if you have a 30-pack-year history of smoking and currently smoke or have quit within the past 15 years.  Fecal occult blood test (FOBT) of the stool. You may have this test every year starting at age 69.  Flexible sigmoidoscopy or colonoscopy. You may have a sigmoidoscopy every 5 years or a colonoscopy every 10 years starting at age 69.  Hepatitis C blood test.  Hepatitis B blood test.  Sexually transmitted disease (STD) testing.  Diabetes screening. This is done by checking your blood sugar (glucose) after you have not eaten for a while (fasting). You may have this done every 1-3 years.  Bone density scan. This is done to screen for osteoporosis. You may have this done starting at age 69.  Mammogram. This may be done  every 1-2 years. Talk to your health care provider about how often you should have regular mammograms. Talk with your health care provider about your test results, treatment options, and if necessary, the need for more tests. Vaccines  Your health care provider may  recommend certain vaccines, such as:  Influenza vaccine. This is recommended every year.  Tetanus, diphtheria, and acellular pertussis (Tdap, Td) vaccine. You may need a Td booster every 10 years.  Zoster vaccine. You may need this after age 69.  Pneumococcal 13-valent conjugate (PCV13) vaccine. One dose is recommended after age 69.  Pneumococcal polysaccharide (PPSV23) vaccine. One dose is recommended after age 69. Talk to your health care provider about which screenings and vaccines you need and how often you need them. This information is not intended to replace advice given to you by your health care provider. Make sure you discuss any questions you have with your health care provider. Document Released: 11/18/2015 Document Revised: 07/11/2016 Document Reviewed: 08/23/2015 Elsevier Interactive Patient Education  2017 [place] Prevention in the Home Falls can cause injuries. They can happen to people of all ages. There are many things you can do to make your home safe and to help prevent falls. What can I do on the outside of my home?  Regularly fix the edges of walkways and driveways and fix any cracks.  Remove anything that might make you trip as you walk through a door, such as a raised step or threshold.  Trim any bushes or trees on the path to your home.  Use bright outdoor lighting.  Clear any walking paths of anything that might make someone trip, such as rocks or tools.  Regularly check to see if handrails are loose or broken. Make sure that both sides of any steps have handrails.  Any raised decks and porches should have guardrails on the edges.  Have any leaves, snow, or ice cleared regularly.  Use sand or salt on walking paths during winter.  Clean up any spills in your garage right away. This includes oil or grease spills. What can I do in the bathroom?  Use night lights.  Install grab bars by the toilet and in the tub and shower. Do not use towel  bars as grab bars.  Use non-skid mats or decals in the tub or shower.  If you need to sit down in the shower, use a plastic, non-slip stool.  Keep the floor dry. Clean up any water that spills on the floor as soon as it happens.  Remove soap buildup in the tub or shower regularly.  Attach bath mats securely with double-sided non-slip rug tape.  Do not have throw rugs and other things on the floor that can make you trip. What can I do in the bedroom?  Use night lights.  Make sure that you have a light by your bed that is easy to reach.  Do not use any sheets or blankets that are too big for your bed. They should not hang down onto the floor.  Have a firm chair that has side arms. You can use this for support while you get dressed.  Do not have throw rugs and other things on the floor that can make you trip. What can I do in the kitchen?  Clean up any spills right away.  Avoid walking on wet floors.  Keep items that you use a lot in easy-to-reach places.  If you need to reach something above you, use a  strong step stool that has a grab bar.  Keep electrical cords out of the way.  Do not use floor polish or wax that makes floors slippery. If you must use wax, use non-skid floor wax.  Do not have throw rugs and other things on the floor that can make you trip. What can I do with my stairs?  Do not leave any items on the stairs.  Make sure that there are handrails on both sides of the stairs and use them. Fix handrails that are broken or loose. Make sure that handrails are as long as the stairways.  Check any carpeting to make sure that it is firmly attached to the stairs. Fix any carpet that is loose or worn.  Avoid having throw rugs at the top or bottom of the stairs. If you do have throw rugs, attach them to the floor with carpet tape.  Make sure that you have a light switch at the top of the stairs and the bottom of the stairs. If you do not have them, ask someone to  add them for you. What else can I do to help prevent falls?  Wear shoes that:  Do not have high heels.  Have rubber bottoms.  Are comfortable and fit you well.  Are closed at the toe. Do not wear sandals.  If you use a stepladder:  Make sure that it is fully opened. Do not climb a closed stepladder.  Make sure that both sides of the stepladder are locked into place.  Ask someone to hold it for you, if possible.  Clearly mark and make sure that you can see:  Any grab bars or handrails.  First and last steps.  Where the edge of each step is.  Use tools that help you move around (mobility aids) if they are needed. These include:  Canes.  Walkers.  Scooters.  Crutches.  Turn on the lights when you go into a dark area. Replace any light bulbs as soon as they burn out.  Set up your furniture so you have a clear path. Avoid moving your furniture around.  If any of your floors are uneven, fix them.  If there are any pets around you, be aware of where they are.  Review your medicines with your doctor. Some medicines can make you feel dizzy. This can increase your chance of falling. Ask your doctor what other things that you can do to help prevent falls. This information is not intended to replace advice given to you by your health care provider. Make sure you discuss any questions you have with your health care provider. Document Released: 08/18/2009 Document Revised: 03/29/2016 Document Reviewed: 11/26/2014 Elsevier Interactive Patient Education  2017 [employer].

## 2017-04-09 NOTE — Telephone Encounter (Signed)
Attempted to contact pt, no answer. LMOM to return my call. 

## 2017-04-09 NOTE — Telephone Encounter (Signed)
Last seen by me on 03/01/17, at that time she was restarted on one HTN medicine, Amlodipine 10mg  daily, and had been on variety of others. She reported prior headaches on some medicine before.  Now update from Annual Medicare Wellness today 04/09/17, she reports headaches on amlodipine and only takes it as needed. --------------------------------------- Could you please call patient to triage her headaches, find more information:  1. How often they occur? Is it only when takes Amlodipine? Are headaches gone when she does not take Amlodipine? 2. Describe the headache,  3. Does she take anything else for her headaches (Tylenol, Ibuprofen/Aleve/BC) 4. Any other associated symptoms?  If she is concerned that Amlodipine is causing headache side effect, we can switch her to a different BP pill, similar version to one she had been on before, but this would be new pill, Losartan, will be once daily and she would STOP Amlodipine.  If she wants to try this new medicine, let me know and [facility], and she will need to schedule a LAB ONLY visit (non fasting) to check chemistry in about 3 weeks after starting new medicine (I will order this once I hear back if she wants to try the new medicine).  We can follow-up at her next office visit in July 2018.  Saralyn PilarAlexander Neta Upadhyay, DO [facility] 04/09/2017, 12:43 PM

## 2017-04-10 MED ORDER — LOSARTAN POTASSIUM 50 MG PO TABS: 50.0000 mg | ORAL_TABLET | Freq: Every day | ORAL | 2 refills | Status: DC

## 2017-04-10 NOTE — Telephone Encounter (Signed)
Reviewed additional information. Plan to discontinue Amlodipine. Switch to new med Losartan 50mg  daily for HTN, to see if resolved headache side effect from Amlodipine.  Since new start on ARB, recommend checking chemistry in 3 weeks, there seems to have been confusion, her apt is scheduled for 05/31/17, not June 27.  Please notify patient that her new medicine sent to pharmacy and that she should schedule a NON fasting lab only visit in 3 weeks after starting new med. Keep current apt in July.  Saralyn PilarAlexander Karamalegos, DO [facility] 04/10/2017, 12:12 PM

## 2017-04-10 NOTE — Addendum Note (Signed)
Addended by: Smitty CordsKARAMALEGOS, Sahvanna Mcmanigal J on: 04/10/2017 12:14 PM   Modules accepted: Orders

## 2017-04-10 NOTE — Telephone Encounter (Signed)
As per patient HA occurs every time she takes Amlodipine and goes away when she doesn't take it, HA is constant and painful. She is taking BC powder and other Symptoms is constipation and being anemic would like to start Losartan, wants a copy printed for Rx and has appointment on 06/27 will get blood work at same time.

## 2017-04-12 NOTE — Telephone Encounter (Signed)
error 

## 2017-05-01 ENCOUNTER — Other Ambulatory Visit: Payer: Medicare Other

## 2017-05-01 DIAGNOSIS — I10 Essential (primary) hypertension: Secondary | ICD-10-CM | POA: Diagnosis not present

## 2017-05-02 LAB — BASIC METABOLIC PANEL WITH GFR
BUN: 9 mg/dL (ref 7–25)
CALCIUM: 8.9 mg/dL (ref 8.6–10.4)
CO2: 25 mmol/L (ref 20–31)
CREATININE: 0.87 mg/dL (ref 0.50–0.99)
Chloride: 106 mmol/L (ref 98–110)
GFR, EST AFRICAN AMERICAN: 79 mL/min (ref >=60)
GFR, Est Non African American: 68 mL/min (ref >=60)
GLUCOSE: 78 mg/dL (ref 65–99)
Potassium: 4.6 mmol/L (ref 3.5–5.3)
Sodium: 140 mmol/L (ref 135–146)

## 2017-05-31 ENCOUNTER — Ambulatory Visit (INDEPENDENT_AMBULATORY_CARE_PROVIDER_SITE_OTHER): Payer: Medicare Other | Admitting: Family Medicine

## 2017-05-31 ENCOUNTER — Encounter: Payer: Self-pay | Admitting: Family Medicine

## 2017-05-31 VITALS — BP 144/80 | HR 78 | Temp 98.0°F | Ht 60.0 in | Wt 124.8 lb

## 2017-05-31 DIAGNOSIS — I10 Essential (primary) hypertension: Secondary | ICD-10-CM

## 2017-05-31 DIAGNOSIS — K5900 Constipation, unspecified: Secondary | ICD-10-CM | POA: Diagnosis not present

## 2017-05-31 NOTE — Progress Notes (Signed)
Subjective:    Patient ID: Kirsten [name], female    DOB: 01-17-1946, 71 y.o.   MRN: [id number]  Kirsten CopasMary F Czerwinski is a 71 y.o. female presenting on 05/31/2017 for Hypertension and Constipation (pt currently taking otc laxative to have a bowel movement. Last bm x 2 days ago. )   HPI   CHRONIC HTN: - Last visit with me 03/01/17, for visit for HTN, switched off of ACEi due to possible cough, switched to only Amlodipine which caused headache, she called our office on 04/09/17, switched to Losartan 50mg  once daily with improvement. See prior notes for background information. - Interval update and today with tolerating Losartan 50mg  daily well now without headache or cough. - Has BP cuff at home, not checking Current Meds - Losartan 50mg  No significant lifestyle changes with diet / exercise Today she is more concerned about her constipation than her blood pressure, see below, admits feels stressed Denies CP, dyspnea, HA, edema, dizziness / lightheadedness  CONSTIPATION: - Last visit with me for this problem, 12/26/16, treated with increased regular miralax dosing isntead of Amitiza which thought was causing side effect vs ineffective, also she was already scheduled for colonoscopy with GI, see prior notes for background information. - Interval update with saw [facility] on 02/06/17, some confusion on past history of colonoscopy on patient's behalf, and she was taking OTC Colace instead, did not seem to be adhering to regular miralax. She was to have colonoscopy arranged, but appears to never have completed it or did not complete prep - Today patient reports recently she was scheduled for colonoscopy but states it was "re-scheduled", chart says problem with sending bowel prep, and now it is re-scheduled for August 13 - Describes constipation with both some episodes of harder dry stools but then often with some medicine stool can be soft, but has a problem with actual evacuation of stool from rectum - Denies  any fevers, chills, unintentional weight loss, nausea, vomiting, abdominal pain, rectal bleeding or dark stools, rectal pain   Social History  Substance Use Topics  . Smoking status: Never Smoker  . Smokeless tobacco: Never Used  . Alcohol use No    Review of Systems Per HPI unless specifically indicated above     Objective:    BP (!) 144/80 (BP Location: Left Arm, Cuff Size: Normal)   Pulse 78   Temp 98 F (36.7 C) (Oral)   Ht 5' (1.524 m)   Wt 124 lb 12.8 oz (56.6 kg)   BMI 24.37 kg/m   Wt Readings from Last 3 Encounters:  05/31/17 124 lb 12.8 oz (56.6 kg)  04/09/17 123 lb 12.8 oz (56.2 kg)  03/01/17 123 lb (55.8 kg)    Physical Exam  Constitutional: She is oriented to person, place, and time. She appears well-developed and well-nourished. No distress.  Well-appearing, comfortable, cooperative  HENT:  Head: Normocephalic and atraumatic.  Mouth/Throat: Oropharynx is clear and moist.  Eyes: Conjunctivae are normal. Right eye exhibits no discharge. Left eye exhibits no discharge.  Cardiovascular: Normal rate, regular rhythm, normal heart sounds and intact distal pulses.   No murmur heard. Pulmonary/Chest: Effort normal and breath sounds normal. No respiratory distress. She has no wheezes. She has no rales.  Abdominal: Soft. Bowel sounds are normal. She exhibits no distension and no mass. There is no tenderness. There is no rebound.  Musculoskeletal: She exhibits no edema.  Neurological: She is alert and oriented to person, place, and time.  Skin: Skin is warm  and dry. No rash noted. She is not diaphoretic. No erythema.  Psychiatric: She has a normal mood and affect. Her behavior is normal.  Well groomed, good eye contact, normal speech and thoughts  Nursing note and vitals reviewed.  Results for orders placed or performed in visit on 05/01/17  BASIC METABOLIC PANEL WITH GFR  Result Value Ref Range   Sodium 140 135 - 146 mmol/L   Potassium 4.6 3.5 - 5.3 mmol/L    Chloride 106 98 - 110 mmol/L   CO2 25 20 - 31 mmol/L   Glucose, Bld 78 65 - 99 mg/dL   BUN 9 7 - 25 mg/dL   Creat 4.090.87 8.110.50 - 9.140.99 mg/dL   Calcium 8.9 8.6 - 78.210.4 mg/dL   GFR, Est African American 79 >=60 mL/min   GFR, Est Non African American 68 >=60 mL/min      Assessment & Plan:   Problem List Items Addressed This Visit    Hypertension - Primary    Still elevated BP, mild improve on manual re-check - concerns for medication non adherence, however she admits to compliance. - Home BP readings - None  No known complications Failed ACEi (Cough), Amlodipine (HA)   Plan:  1. Continue current BP regimen - Losartan 50mg  daily for now since we do not know home readings, needs to check regularly, bring readings back and BP re-check 6 weeks to determine if need inc dose Losartan 2. Encourage improved lifestyle - low sodium diet, regular exercise 3. Start monitor BP outside office, bring readings to next visit, if persistently >140/90 or new symptoms notify office sooner 4. Follow-up 6 weeks      Constipation    Similar to last visit, questions of med non adherence, no GI red flags, seems to have more problem with stool evacuation from rectum and less actual constipation but will have mixed symptoms. - Unclear which stool softeners she is using regularly, poor historian, seems miralax helped but then she cannot give reason why she stopped it, did not realize it could be used daily for maintenance if working  Plan: 1. Reassurance, should re-try regular dosing Miralax 17g 1 cap daily can inc dose to 2 PRN if needed, fine to take Colace as well if that is what was helping too. 2. Reviewed toilet positioning with small stool to raise legs to help evacuation 3. Improve hydration 4. Follow-up as scheduled with [facility] for colonoscopy 5. Return to see me in 6 weeks for BP check, and can review her colonoscopy results         No orders of the defined types were placed in this  encounter.   Follow up plan: Return in about 6 weeks (around 07/12/2017) for blood pressure, constipation.  Saralyn PilarAlexander Shyvonne Chastang, DO [facility] 06/01/2017, 8:32 AM

## 2017-05-31 NOTE — Patient Instructions (Addendum)
Thank you for coming to the clinic today.  1. Blood Pressure mildly elevated still Please check BP at home, 1-3 times weekly, write down numbers, if still >140/90 then we may need to increase pill Losartan  2 Follow-up with GI for Colonoscopy, complete bowel prep when ready and otherwise may take Miralax 1 to 2 times daily or use suppository  Please schedule a Follow-up Appointment to: Return in about 6 weeks (around 07/12/2017) for blood pressure, constipation.  If you have any other questions or concerns, please feel free to call the clinic or send a message through MyChart. You may also schedule an earlier appointment if necessary.  Additionally, you may be receiving a survey about your experience at our clinic within a few days to 1 week by e-mail or mail. We value your feedback.  Saralyn PilarAlexander Sadrac Zeoli, DO [facility]

## 2017-06-01 NOTE — Assessment & Plan Note (Signed)
Still elevated BP, mild improve on manual re-check - concerns for medication non adherence, however she admits to compliance. - Home BP readings - None  No known complications Failed ACEi (Cough), Amlodipine (HA)   Plan:  1. Continue current BP regimen - Losartan 50mg  daily for now since we do not know home readings, needs to check regularly, bring readings back and BP re-check 6 weeks to determine if need inc dose Losartan 2. Encourage improved lifestyle - low sodium diet, regular exercise 3. Start monitor BP outside office, bring readings to next visit, if persistently >140/90 or new symptoms notify office sooner 4. Follow-up 6 weeks

## 2017-06-01 NOTE — Assessment & Plan Note (Signed)
Similar to last visit, questions of med non adherence, no GI red flags, seems to have more problem with stool evacuation from rectum and less actual constipation but will have mixed symptoms. - Unclear which stool softeners she is using regularly, poor historian, seems miralax helped but then she cannot give reason why she stopped it, did not realize it could be used daily for maintenance if working  Plan: 1. Reassurance, should re-try regular dosing Miralax 17g 1 cap daily can inc dose to 2 PRN if needed, fine to take Colace as well if that is what was helping too. 2. Reviewed toilet positioning with small stool to raise legs to help evacuation 3. Improve hydration 4. Follow-up as scheduled with [facility] for colonoscopy 5. Return to see me in 6 weeks for BP check, and can review her colonoscopy results

## 2017-06-08 ENCOUNTER — Other Ambulatory Visit: Payer: Self-pay | Admitting: Family Medicine

## 2017-06-08 DIAGNOSIS — I10 Essential (primary) hypertension: Secondary | ICD-10-CM

## 2017-06-14 ENCOUNTER — Encounter: Payer: Self-pay | Admitting: *Deleted

## 2017-06-17 ENCOUNTER — Ambulatory Visit: Payer: Medicare Other | Admitting: Certified Registered"

## 2017-06-17 ENCOUNTER — Encounter
Admission: RE | Disposition: A | Discharge status: Home / Self Care | Payer: Self-pay | Source: Ambulatory Visit | Attending: Gastroenterology

## 2017-06-17 ENCOUNTER — Ambulatory Visit
Admission: RE | Admit: 2017-06-17 | Discharge: 2017-06-17 | Disposition: A | Discharge status: Home / Self Care | Payer: Medicare Other | Source: Ambulatory Visit | Attending: Gastroenterology | Admitting: Gastroenterology

## 2017-06-17 DIAGNOSIS — I10 Essential (primary) hypertension: Secondary | ICD-10-CM | POA: Insufficient documentation

## 2017-06-17 DIAGNOSIS — Z79899 Other long term (current) drug therapy: Secondary | ICD-10-CM | POA: Insufficient documentation

## 2017-06-17 DIAGNOSIS — E785 Hyperlipidemia, unspecified: Secondary | ICD-10-CM | POA: Diagnosis not present

## 2017-06-17 DIAGNOSIS — I252 Old myocardial infarction: Secondary | ICD-10-CM | POA: Insufficient documentation

## 2017-06-17 DIAGNOSIS — Z86718 Personal history of other venous thrombosis and embolism: Secondary | ICD-10-CM | POA: Diagnosis not present

## 2017-06-17 DIAGNOSIS — F329 Major depressive disorder, single episode, unspecified: Secondary | ICD-10-CM | POA: Diagnosis not present

## 2017-06-17 DIAGNOSIS — Z7982 Long term (current) use of aspirin: Secondary | ICD-10-CM | POA: Insufficient documentation

## 2017-06-17 DIAGNOSIS — N816 Rectocele: Secondary | ICD-10-CM | POA: Diagnosis not present

## 2017-06-17 DIAGNOSIS — J45909 Unspecified asthma, uncomplicated: Secondary | ICD-10-CM | POA: Insufficient documentation

## 2017-06-17 DIAGNOSIS — Z538 Procedure and treatment not carried out for other reasons: Secondary | ICD-10-CM | POA: Diagnosis not present

## 2017-06-17 DIAGNOSIS — M1990 Unspecified osteoarthritis, unspecified site: Secondary | ICD-10-CM | POA: Diagnosis not present

## 2017-06-17 DIAGNOSIS — F0390 Unspecified dementia without behavioral disturbance: Secondary | ICD-10-CM | POA: Diagnosis not present

## 2017-06-17 DIAGNOSIS — R51 Headache: Secondary | ICD-10-CM | POA: Insufficient documentation

## 2017-06-17 DIAGNOSIS — K5900 Constipation, unspecified: Secondary | ICD-10-CM | POA: Insufficient documentation

## 2017-06-17 HISTORY — DX: Unspecified injury of lower back, initial encounter: S39.92XA

## 2017-06-17 HISTORY — DX: Acute myocardial infarction, unspecified: I21.9

## 2017-06-17 HISTORY — DX: Unspecified asthma, uncomplicated: J45.909

## 2017-06-17 HISTORY — DX: Personal history of other infectious and parasitic diseases: Z86.19

## 2017-06-17 HISTORY — DX: Other amnesia: R41.3

## 2017-06-17 HISTORY — PX: COLONOSCOPY WITH PROPOFOL: SHX5780

## 2017-06-17 SURGERY — COLONOSCOPY WITH PROPOFOL
Anesthesia: General

## 2017-06-17 MED ORDER — PROPOFOL 10 MG/ML IV BOLUS
INTRAVENOUS | Status: DC | PRN
  Administered 2017-06-17: 50 mg via INTRAVENOUS
  Administered 2017-06-17: 20 mg via INTRAVENOUS

## 2017-06-17 MED ORDER — SODIUM CHLORIDE 0.9 % IV SOLN
INTRAVENOUS | Status: DC
  Administered 2017-06-17: 10:00:00 via INTRAVENOUS

## 2017-06-17 MED ORDER — METOPROLOL TARTRATE 5 MG/5ML IV SOLN
INTRAVENOUS | Status: AC
  Filled 2017-06-17: qty 5

## 2017-06-17 MED ORDER — SODIUM CHLORIDE 0.9 % IV SOLN: INTRAVENOUS | Status: DC

## 2017-06-17 MED ORDER — METOPROLOL TARTRATE 5 MG/5ML IV SOLN
INTRAVENOUS | Status: DC | PRN
  Administered 2017-06-17: 1 mg via INTRAVENOUS

## 2017-06-17 MED ORDER — LIDOCAINE HCL (CARDIAC) 20 MG/ML IV SOLN
INTRAVENOUS | Status: DC | PRN
  Administered 2017-06-17: 50 mg via INTRAVENOUS

## 2017-06-17 MED ORDER — PROPOFOL 500 MG/50ML IV EMUL
INTRAVENOUS | Status: AC
  Filled 2017-06-17: qty 50

## 2017-06-17 MED ORDER — PROPOFOL 500 MG/50ML IV EMUL
INTRAVENOUS | Status: DC | PRN
  Administered 2017-06-17: 125 ug/kg/min via INTRAVENOUS

## 2017-06-17 NOTE — Anesthesia Post-op Follow-up Note (Signed)
Anesthesia QCDR form completed.        

## 2017-06-17 NOTE — Op Note (Signed)
[facility] Gastroenterology Patient Name: Kirsten [name] Procedure Date: 06/17/2017 9:42 AM MRN: [id number] Account #: [id number] Date of Birth: 07/18/1946 Admit Type: Outpatient Age: 71 Room: [facility] ENDO ROOM 1 Gender: Female Note Status: Finalized Procedure:            Colonoscopy Indications:          Change in bowel habits, Constipation Providers:            Christena Deem, MD Referring MD:         Smitty Cords (Referring MD) Medicines:            Monitored Anesthesia Care Complications:        No immediate complications. Procedure:            Pre-Anesthesia Assessment:                       - ASA Grade Assessment: III - A patient with severe                        systemic disease.                       After obtaining informed consent, the colonoscope was                        passed under direct vision. Throughout the procedure,                        the patient's blood pressure, pulse, and oxygen                        saturations were monitored continuously. The                        Colonoscope was introduced through the anus with the                        intention of advancing to the cecum. The scope was                        advanced to the descending colon before the procedure                        was aborted. Medications were given. The colonoscopy                        was extremely difficult due to poor bowel prep with                        stool present, restricted mobility of the colon and a                        tortuous colon. Successful completion of the procedure                        was aided by changing the patient to a supine position,                        changing the patient to a prone position, using manual  pressure and withdrawing and reinserting the scope. The                        patient tolerated the procedure well. The quality of                        the bowel preparation was  poor. Findings:      The lumen showed a very sharp angulation at about 20-24 cm that was       difficult to approach. Once above this sharp angulation, the prep became       progressively worse to the point wher solid stool was coating the entire       lumen in the proximal descending. Procedure terminated at this point.      The digital rectal exam was normal. Impression:           - Preparation of the colon was poor.                       - No specimens collected. Recommendation:       - Discharge patient to home.                       - 2 day prep and reschedule procedure.                       - Miralax 1 capful (17 grams) in 8 ounces of water PO                        BID until colonoscopy can be rescheduled. Procedure Code(s):    --- Professional ---                       [phone], 53, Colonoscopy, flexible; diagnostic, including                        collection of specimen(s) by brushing or washing, when                        performed (separate procedure) Diagnosis Code(s):    --- Professional ---                       R19.4, Change in bowel habit                       K59.00, Constipation, unspecified CPT copyright 2016 American Medical Association. All rights reserved. The codes documented in this report are preliminary and upon coder review may  be revised to meet current compliance requirements. Christena DeemMartin U Georgeanne Frankland, MD 06/17/2017 10:35:22 AM This report has been signed electronically. Number of Addenda: 0 Note Initiated On: 06/17/2017 9:42 AM Total Procedure Duration: 0 hours 31 minutes 58 seconds       [facility]

## 2017-06-17 NOTE — Anesthesia Postprocedure Evaluation (Signed)
Anesthesia Post Note  Patient: Michaelle CopasMary F Vandeusen  Procedure(s) Performed: Procedure(s) (LRB): COLONOSCOPY WITH PROPOFOL (N/A)  Patient location during evaluation: Endoscopy Anesthesia Type: General Level of consciousness: awake and alert Pain management: pain level controlled Vital Signs Assessment: post-procedure vital signs reviewed and stable Respiratory status: spontaneous breathing and respiratory function stable Cardiovascular status: stable Anesthetic complications: no     Last Vitals:  Vitals:   06/17/17 1030 06/17/17 1031  BP: (!) 93/57 (!) 93/57  Pulse: 63 62  Resp: 13 12  Temp:    SpO2: 100% 100%    Last Pain:  Vitals:   06/17/17 0838  TempSrc: Tympanic                 KEPHART,WILLIAM K

## 2017-06-17 NOTE — H&P (Signed)
Outpatient short stay form Pre-procedure 06/17/2017 9:36 AM Christena Deem MD  Primary Physician: Dr Saralyn Pilar  Reason for visit:  Colonoscopy  History of present illness:  Patient is a 71 year old female presenting today for her initial colonoscopy. She has been having some changes of bowel habits with increasing constipation over the period past 3 months or so. She tolerated her prep well. She does take Goody powders or BC powders regularly but has not for at least a week. She does take a 81 mg aspirin daily due to was last taken yesterday. He takes no other blood thinning agents or aspirin products.    Current Facility-Administered Medications:  .  0.9 %  sodium chloride infusion, , Intravenous, Continuous, Christena Deem, MD .  0.9 %  sodium chloride infusion, , Intravenous, Continuous, Christena Deem, MD  Prescriptions Prior to Admission  Medication Sig Dispense Refill Last Dose  . albuterol (PROVENTIL HFA) 108 (90 BASE) MCG/ACT inhaler Inhale 2 puffs into the lungs every 6 (six) hours as needed for wheezing or shortness of breath.   06/16/2017 at Unknown time  . aspirin EC 81 MG tablet Take 1 tablet (81 mg total) by mouth daily.   06/16/2017 at Unknown time  . atorvastatin (LIPITOR) 20 MG tablet Take 20 mg by mouth daily.   06/16/2017 at Unknown time  . dextromethorphan-guaiFENesin (MUCINEX DM) 30-600 MG 12hr tablet Take 1 tablet by mouth 2 (two) times daily.   Past Month at Unknown time  . docusate sodium (COLACE) 100 MG capsule Take by mouth daily.    06/16/2017 at Unknown time  . fluticasone (FLONASE) 50 MCG/ACT nasal spray SHAKE LIQUID AND USE 2 SPRAYS IN EACH NOSTRIL DAILY 48 g 3 06/16/2017 at Unknown time  . ipratropium-albuterol (DUONEB) 0.5-2.5 (3) MG/3ML SOLN    Past Month at Unknown time  . losartan (COZAAR) 50 MG tablet TAKE 1 TABLET(50 MG) BY MOUTH DAILY 30 tablet 5 06/16/2017 at Unknown time  . polyethylene glycol powder (GLYCOLAX/MIRALAX) powder Take 17 g  by mouth 2 (two) times daily as needed for mild constipation or moderate constipation. 250 g 2 06/16/2017 at Unknown time  . amLODipine-benazepril (LOTREL) 10-20 MG capsule Take 1 capsule by mouth daily.   Completed Course at Unknown time  . gabapentin (NEURONTIN) 100 MG capsule TAKE 1 CAPSULE(100 MG) BY MOUTH THREE TIMES DAILY (Patient not taking: Reported on 05/31/2017) 90 capsule 3 Completed Course at Unknown time     Allergies  Allergen Reactions  . Amlodipine Other (See Comments)    Headache     Past Medical History:  Diagnosis Date  . Allergy   . Arthritis   . Asthma   . Back injury   . Depression   . Heart disease   . History of blood clots   . History of chicken pox   . Hyperlipidemia   . Hypertension   . Memory loss   . Migraine headache   . Myocardial infarction (HCC)   . Seizures (HCC)     Review of systems:      Physical Exam    Heart and lungs: Regular rate and rhythm without rub or gallop, lungs are bilaterally clear.    HEENT: Normocephalic atraumatic eyes are anicteric    Other:     Pertinant exam for procedure: Soft nontender nondistended bowel sounds positive normoactive.    Planned proceedures: Colonoscopy and indicated procedures. I have discussed the risks benefits and complications of procedures to include not limited to bleeding,  infection, perforation and the risk of sedation and the patient wishes to proceed.    Christena DeemMartin U Dulcinea Kinser, MD Gastroenterology 06/17/2017  9:36 AM

## 2017-06-17 NOTE — Transfer of Care (Signed)
Immediate Anesthesia Transfer of Care Note  Patient: Kirsten CopasMary F [name]  Procedure(s) Performed: Procedure(s): COLONOSCOPY WITH PROPOFOL (N/A)  Patient Location: PACU  Anesthesia Type:General  Level of Consciousness: sedated  Airway & Oxygen Therapy: Patient Spontanous Breathing and Patient connected to nasal cannula oxygen  Post-op Assessment: Report given to RN and Post -op Vital signs reviewed and stable  Post vital signs: Reviewed and stable  Last Vitals:  Vitals:   06/17/17 1030 06/17/17 1031  BP: (!) 93/57 (!) 93/57  Pulse: 63 62  Resp: 13 12  Temp:    SpO2: 100% 100%    Last Pain:  Vitals:   06/17/17 0838  TempSrc: Tympanic         Complications: No apparent anesthesia complications

## 2017-06-17 NOTE — Anesthesia Preprocedure Evaluation (Signed)
Anesthesia Evaluation  Patient identified by MRN, date of birth, ID band Patient awake    Reviewed: Allergy & Precautions, NPO status , Patient's Chart, lab work & pertinent test results  History of Anesthesia Complications Negative for: history of anesthetic complications  Airway Mallampati: II       Dental  (+) Upper Dentures, Lower Dentures   Pulmonary asthma ,           Cardiovascular hypertension, Pt. on medications + Past MI  + Valvular Problems/Murmurs (murmur, no tx)      Neuro/Psych Depression Mild dementia    GI/Hepatic negative GI ROS, Neg liver ROS,   Endo/Other  negative endocrine ROS  Renal/GU negative Renal ROS     Musculoskeletal   Abdominal   Peds  Hematology   Anesthesia Other Findings   Reproductive/Obstetrics                             Anesthesia Physical Anesthesia Plan  ASA: III  Anesthesia Plan: General   Post-op Pain Management:    Induction: Intravenous  PONV Risk Score and Plan:   Airway Management Planned:   Additional Equipment:   Intra-op Plan:   Post-operative Plan:   Informed Consent: I have reviewed the patients History and Physical, chart, labs and discussed the procedure including the risks, benefits and alternatives for the proposed anesthesia with the patient or authorized representative who has indicated his/her understanding and acceptance.     Plan Discussed with:   Anesthesia Plan Comments:         Anesthesia Quick Evaluation

## 2017-06-18 ENCOUNTER — Encounter: Payer: Self-pay | Admitting: Gastroenterology

## 2017-07-12 ENCOUNTER — Ambulatory Visit: Payer: Medicare Other | Admitting: Family Medicine

## 2017-07-17 ENCOUNTER — Encounter: Payer: Self-pay | Admitting: Emergency Medicine

## 2017-07-17 ENCOUNTER — Emergency Department
Admission: EM | Admit: 2017-07-17 | Discharge: 2017-07-17 | Disposition: A | Discharge status: Home / Self Care | Payer: Medicare Other | Attending: Emergency Medicine | Admitting: Emergency Medicine

## 2017-07-17 ENCOUNTER — Emergency Department: Payer: Medicare Other

## 2017-07-17 DIAGNOSIS — K5900 Constipation, unspecified: Secondary | ICD-10-CM | POA: Diagnosis not present

## 2017-07-17 DIAGNOSIS — G8929 Other chronic pain: Secondary | ICD-10-CM | POA: Diagnosis not present

## 2017-07-17 DIAGNOSIS — I10 Essential (primary) hypertension: Secondary | ICD-10-CM | POA: Diagnosis not present

## 2017-07-17 DIAGNOSIS — J45909 Unspecified asthma, uncomplicated: Secondary | ICD-10-CM | POA: Insufficient documentation

## 2017-07-17 DIAGNOSIS — R109 Unspecified abdominal pain: Secondary | ICD-10-CM | POA: Diagnosis not present

## 2017-07-17 DIAGNOSIS — Z791 Long term (current) use of non-steroidal anti-inflammatories (NSAID): Secondary | ICD-10-CM | POA: Diagnosis not present

## 2017-07-17 DIAGNOSIS — Z7982 Long term (current) use of aspirin: Secondary | ICD-10-CM | POA: Insufficient documentation

## 2017-07-17 DIAGNOSIS — Z79899 Other long term (current) drug therapy: Secondary | ICD-10-CM | POA: Diagnosis not present

## 2017-07-17 LAB — URINALYSIS, COMPLETE (UACMP) WITH MICROSCOPIC
Bacteria, UA: NONE SEEN
Bilirubin Urine: NEGATIVE
GLUCOSE, UA: NEGATIVE mg/dL
Ketones, ur: NEGATIVE mg/dL
Leukocytes, UA: NEGATIVE
Nitrite: NEGATIVE
PH: 6 (ref 5.0–8.0)
PROTEIN: NEGATIVE mg/dL
RBC / HPF: NONE SEEN RBC/hpf (ref 0–5)
SPECIFIC GRAVITY, URINE: 1.002 — AB (ref 1.005–1.030)
SQUAMOUS EPITHELIAL / LPF: NONE SEEN

## 2017-07-17 LAB — COMPREHENSIVE METABOLIC PANEL
ALT: 41 U/L (ref 14–54)
ANION GAP: 9 (ref 5–15)
AST: 46 U/L — AB (ref 15–41)
Albumin: 3.6 g/dL (ref 3.5–5.0)
Alkaline Phosphatase: 64 U/L (ref 38–126)
BILIRUBIN TOTAL: 0.5 mg/dL (ref 0.3–1.2)
BUN: 8 mg/dL (ref 6–20)
CO2: 23 mmol/L (ref 22–32)
Calcium: 9.1 mg/dL (ref 8.9–10.3)
Chloride: 108 mmol/L (ref 101–111)
Creatinine, Ser: 0.82 mg/dL (ref 0.44–1.00)
Glucose, Bld: 100 mg/dL — ABNORMAL HIGH (ref 65–99)
POTASSIUM: 3.3 mmol/L — AB (ref 3.5–5.1)
Sodium: 140 mmol/L (ref 135–145)
Total Protein: 6.6 g/dL (ref 6.5–8.1)

## 2017-07-17 LAB — CBC
HEMATOCRIT: 35.9 % (ref 35.0–47.0)
Hemoglobin: 12 g/dL (ref 12.0–16.0)
MCH: 26.6 pg (ref 26.0–34.0)
MCHC: 33.5 g/dL (ref 32.0–36.0)
MCV: 79.4 fL — ABNORMAL LOW (ref 80.0–100.0)
Platelets: 288 10*3/uL (ref 150–440)
RBC: 4.53 MIL/uL (ref 3.80–5.20)
RDW: 15.5 % — AB (ref 11.5–14.5)
WBC: 9.3 10*3/uL (ref 3.6–11.0)

## 2017-07-17 LAB — LIPASE, BLOOD: LIPASE: 29 U/L (ref 11–51)

## 2017-07-17 MED ORDER — DOCUSATE SODIUM 100 MG PO CAPS: 100.0000 mg | ORAL_CAPSULE | Freq: Two times a day (BID) | ORAL | 0 refills | Status: AC

## 2017-07-17 MED ORDER — SODIUM CHLORIDE 0.9 % IV BOLUS (SEPSIS)
1000.0000 mL | Freq: Once | INTRAVENOUS | Status: AC
  Administered 2017-07-17: 1000 mL via INTRAVENOUS

## 2017-07-17 MED ORDER — SENNA 8.6 MG PO TABS: 1.0000 | ORAL_TABLET | Freq: Every day | ORAL | 1 refills | Status: DC

## 2017-07-17 MED ORDER — ONDANSETRON HCL 4 MG/2ML IJ SOLN
4.0000 mg | Freq: Once | INTRAMUSCULAR | Status: AC
  Administered 2017-07-17: 4 mg via INTRAVENOUS
  Filled 2017-07-17: qty 2

## 2017-07-17 NOTE — ED Triage Notes (Signed)
Pt reports she has been dealing with constipation issues since last December, pt has had a colonoscopy and sts she was told she had a blockage and is to follow up with Dr. Kathryne HitchLondon on November 8th for surgery options. Pt to ED tonight due unrelieved constipation and pain in abdomin. Pt reports she has tried OTC meds without relief.

## 2017-07-17 NOTE — Discharge Instructions (Signed)
Constipation: Take colace twice a day everyday. Take senna once a day at bedtime. Take daily probiotics. Drink plenty of fluids and eat a diet rich in fiber. If you go more than 3 days without a bowel movement, take 1 cap full of Miralax in the morning and one in the evening up to 5 days.   Follow up with your GI doctor for repeat colonoscopy.  Return to the ER for abdominal pain, vomiting, or any new symptoms concerning to you.

## 2017-07-17 NOTE — ED Notes (Signed)
Pt resting in bed, resp even and unlabored, denies any needs 

## 2017-07-17 NOTE — ED Provider Notes (Signed)
[facility] Emergency Department Provider Note  ____________________________________________  Time seen: Approximately 7:40 AM  I have reviewed the triage vital signs and the nursing notes.   HISTORY  Chief Complaint Constipation   HPI Michaelle CopasMary F Beckum is a 71 y.o. female with a history of coronary artery disease, hypertension, hyperlipidemia who presents for evaluation of constipation. Patient reports that since Christmas she has been having difficulty moving her bowels. She has been using MiraLAX, Colace, and suppositories. Last bowel movement was yesterday. She reports that her bowels will not move unless she takes medication. She underwent a colonoscopy a month ago however that was inconclusive due to poor bowel prep. She reports occasional pressure in her rectum area. She denies abdominal pain, vomiting, abdominal distention. She is passing flatus. She reports that her symptoms have been ongoing and not improving which prompted her visit to the emergency room. She reports a mild to moderate pressure-like pain located in her rectum that is constant and worse when she sits down or bears down. She endorses nausea.   Past Medical History:  Diagnosis Date  . Allergy   . Arthritis   . Asthma   . Back injury   . Depression   . Heart disease   . History of blood clots   . History of chicken pox   . Hyperlipidemia   . Hypertension   . Memory loss   . Migraine headache   . Myocardial infarction (HCC)   . Seizures [facility](HCC)     Patient Active Problem List   Diagnosis Date Noted  . Allergic rhinitis due to allergen 03/01/2017  . Constipation 12/26/2016  . History of bowel resection 12/26/2016  . Depression 11/18/2015  . HA (headache) 11/18/2015  . Memory loss 11/18/2015  . Migraine headache 11/18/2015  . Seizures (HCC) 11/18/2015  . Chronic hip pain 11/18/2015  . Hypertension 09/20/2015  . Hyperlipidemia 09/20/2015  . Chronic pain 09/20/2015  . Heart  murmur 09/20/2015  . Asthma without status asthmaticus 09/20/2015  . Post concussion syndrome 11/08/2014  . Restless legs 10/05/2014  . Concussion with loss of consciousness 08/30/2014    Past Surgical History:  Procedure Laterality Date  . ABDOMINAL HYSTERECTOMY    . APPENDECTOMY    . BOWEL RESECTION    . COLONOSCOPY WITH PROPOFOL N/A 06/17/2017   Procedure: COLONOSCOPY WITH PROPOFOL;  Surgeon: Christena DeemSkulskie, Martin U, MD;  [facility] ENDOSCOPY;  Service: Endoscopy;  Laterality: N/A;    Prior to Admission medications   Medication Sig Start Date End Date Taking? Authorizing Provider  aspirin EC 81 MG tablet Take 1 tablet (81 mg total) by mouth daily. 09/20/15  Yes Krebs, Amy Lauren, NP  losartan (COZAAR) 50 MG tablet Take 50 mg by mouth daily.   Yes [provider]  lubiprostone (AMITIZA) 24 MCG capsule Take 24 mcg by mouth 2 (two) times daily with a meal.   Yes [provider]  meloxicam (MOBIC) 15 MG tablet Take 15 mg by mouth daily.   Yes [provider]  montelukast (SINGULAIR) 10 MG tablet Take 10 mg by mouth at bedtime.   Yes [provider]  polyethylene glycol powder (GLYCOLAX/MIRALAX) powder Take 17 g by mouth 2 (two) times daily as needed for mild constipation or moderate constipation. 12/26/16  Yes Karamalegos, Netta NeatAlexander J, DO  rosuvastatin (CRESTOR) 20 MG tablet Take 20 mg by mouth daily.   Yes [provider]  albuterol (PROVENTIL HFA) 108 (90 BASE) MCG/ACT inhaler Inhale 2 puffs into  the lungs every 6 (six) hours as needed for wheezing or shortness of breath.    [provider]  docusate sodium (COLACE) 100 MG capsule Take 1 capsule (100 mg total) by mouth 2 (two) times daily. 07/17/17 07/17/18  Nita Sickle, MD  fluticasone [facility]) 50 MCG/ACT nasal spray SHAKE LIQUID AND USE 2 SPRAYS IN [facility] NOSTRIL DAILY Patient not taking: Reported on 07/17/2017 12/21/15   Loura Pardon, NP  gabapentin (NEURONTIN) 100 MG capsule  TAKE 1 CAPSULE(100 MG) BY MOUTH THREE TIMES DAILY Patient not taking: Reported on 05/31/2017 03/27/16   Loura Pardon, NP  losartan (COZAAR) 50 MG tablet TAKE 1 TABLET(50 MG) BY MOUTH DAILY Patient not taking: Reported on 07/17/2017 06/08/17   Smitty Cords, DO  senna (SENOKOT) 8.6 MG TABS tablet Take 1 tablet (8.6 mg total) by mouth daily. 07/17/17   Nita Sickle, MD    Allergies Amlodipine  Family History  Problem Relation Age of Onset  . Cancer Father     Social History Social History  Substance Use Topics  . Smoking status: Never Smoker  . Smokeless tobacco: Never Used  . Alcohol use No    Review of Systems  Constitutional: Negative for fever. Eyes: Negative for visual changes. ENT: Negative for sore throat. Neck: No neck pain  Cardiovascular: Negative for chest pain. Respiratory: Negative for shortness of breath. Gastrointestinal: Negative for abdominal pain, vomiting or diarrhea. + constipation and rectal pressure Genitourinary: Negative for dysuria. Musculoskeletal: Negative for back pain. Skin: Negative for rash. Neurological: Negative for headaches, weakness or numbness. Psych: No SI or HI  ____________________________________________   PHYSICAL EXAM:  VITAL SIGNS: ED Triage Vitals  Enc Vitals Group     BP 07/17/17 0457 (!) 154/86     Pulse Rate 07/17/17 0457 74     Resp 07/17/17 0457 16     Temp 07/17/17 0457 98.3 F (36.8 C)     Temp Source 07/17/17 0457 Oral     SpO2 07/17/17 0457 99 %     Weight 07/17/17 0452 118 lb (53.5 kg)     Height --      Head Circumference --      Peak Flow --      Pain Score --      Pain Loc --      Pain Edu? --      Excl. in GC? --     Constitutional: Alert and oriented. Well appearing and in no apparent distress. HEENT:      Head: Normocephalic and atraumatic.         Eyes: Conjunctivae are normal. Sclera is non-icteric.       Mouth/Throat: Mucous membranes are moist.       Neck: Supple with no  signs of meningismus. Cardiovascular: Regular rate and rhythm. No murmurs, gallops, or rubs. 2+ symmetrical distal pulses are present in all extremities. No JVD. Respiratory: Normal respiratory effort. Lungs are clear to auscultation bilaterally. No wheezes, crackles, or rhonchi.  Gastrointestinal: Soft, non tender, and non distended with positive bowel sounds. No rebound or guarding. Genitourinary: No CVA tenderness.rectal exam showing no evidence of external hemorrhoids, and digit exam with no internal hemorrhoids or masses palpable Musculoskeletal: Nontender with normal range of motion in all extremities. No edema, cyanosis, or erythema of extremities. Neurologic: Normal speech and language. Face is symmetric. Moving all extremities. No gross focal neurologic deficits are appreciated. Skin: Skin is warm, dry and intact. No rash noted. Psychiatric: Mood and affect are normal.  Speech and behavior are normal.  ____________________________________________   LABS (all labs ordered are listed, but only abnormal results are displayed)  Labs Reviewed  COMPREHENSIVE METABOLIC PANEL - Abnormal; Notable for the following:       Result Value   Potassium 3.3 (*)    Glucose, Bld 100 (*)    AST 46 (*)    All other components within normal limits  CBC - Abnormal; Notable for the following:    MCV 79.4 (*)    RDW 15.5 (*)    All other components within normal limits  URINALYSIS, COMPLETE (UACMP) WITH MICROSCOPIC - Abnormal; Notable for the following:    Color, Urine COLORLESS (*)    APPearance CLEAR (*)    Specific Gravity, Urine 1.002 (*)    Hgb urine dipstick SMALL (*)    All other components within normal limits  LIPASE, BLOOD   ____________________________________________  EKG  none  ____________________________________________  RADIOLOGY  KUB: Normal bowel gas pattern with small to moderate colonic  stool burden. ____________________________________________   PROCEDURES  Procedure(s) performed: None Procedures Critical Care performed:  None ____________________________________________   INITIAL IMPRESSION / ASSESSMENT AND PLAN / ED COURSE   71 y.o. female with a history of coronary artery disease, hypertension, hyperlipidemia who presents for evaluation of constipation and rectal pressure. patient is well-appearing, in no distress, normal vital signs, abdomen is soft, nondistended, normal bowel sounds, nontender throughout, rectal exam showed no evidence of rectal prolapse, internal or external hemorrhoids or any masses palpable on digital exam. Patient is supposed to follow-up with her GI doctor for repeat colonoscopy since she had one a month ago that was inconclusive due to poor bowel prep. KUB showing mild/moderate stool burden. Recommended the patient continues to take daily senna, Colace, probiotics, and PRN MiraLAX and follow up with her GI doctor for repeat colonoscopy to rule out a mass. Labs are wnl. Do not believe patient needs any further imaging at this time with normal labs, vitals, and soft non tender abdominal exam. No clinical evidence of SBO    _________________________ 8:45 AM on 07/17/2017 -----------------------------------------  Patient received IVF and zofran. Remains well appearing with no abdominal pain or tenderness. Will dc home with f/u with GI. Discussed return precautions with patient.  Pertinent labs & imaging results that were available during my care of the patient were reviewed by me and considered in my medical decision making (see chart for details).    ____________________________________________   FINAL CLINICAL IMPRESSION(S) / ED DIAGNOSES  Final diagnoses:  Constipation, unspecified constipation type      NEW MEDICATIONS STARTED DURING THIS VISIT:  New Prescriptions   DOCUSATE SODIUM (COLACE) 100 MG CAPSULE    Take 1 capsule (100  mg total) by mouth 2 (two) times daily.   SENNA (SENOKOT) 8.6 MG TABS TABLET    Take 1 tablet (8.6 mg total) by mouth daily.     Note:  This document was prepared using Dragon voice recognition software and may include unintentional dictation errors.    Don Perking, [place], MD 07/17/17 [phone]

## 2017-07-17 NOTE — ED Notes (Signed)
Pt states that she been dealing with a blockage in her bowels since before christmas. Stomach has been hurting and she has to take a laxative to release her bowels. She feel nauseated and dizzy.

## 2017-09-24 ENCOUNTER — Telehealth: Payer: Self-pay | Admitting: Family Medicine

## 2017-09-24 NOTE — Telephone Encounter (Signed)
Kaitlyn with [employer] asked for office notes to be faxed to [phone] for pt's nebulizer.  Her call back number is [phone]

## 2017-09-25 NOTE — Telephone Encounter (Signed)
Notes were faxed

## 2017-09-30 ENCOUNTER — Encounter: Payer: Self-pay | Admitting: Intensive Care

## 2017-09-30 ENCOUNTER — Emergency Department
Admission: EM | Admit: 2017-09-30 | Discharge: 2017-09-30 | Disposition: A | Discharge status: Home / Self Care | Payer: Medicare Other | Attending: Student in an Organized Health Care Education/Training Program | Admitting: Student in an Organized Health Care Education/Training Program

## 2017-09-30 ENCOUNTER — Emergency Department: Payer: Medicare Other

## 2017-09-30 DIAGNOSIS — J069 Acute upper respiratory infection, unspecified: Secondary | ICD-10-CM | POA: Diagnosis not present

## 2017-09-30 DIAGNOSIS — Z79899 Other long term (current) drug therapy: Secondary | ICD-10-CM | POA: Insufficient documentation

## 2017-09-30 DIAGNOSIS — R05 Cough: Secondary | ICD-10-CM | POA: Diagnosis not present

## 2017-09-30 DIAGNOSIS — Z7982 Long term (current) use of aspirin: Secondary | ICD-10-CM | POA: Insufficient documentation

## 2017-09-30 DIAGNOSIS — I10 Essential (primary) hypertension: Secondary | ICD-10-CM | POA: Insufficient documentation

## 2017-09-30 DIAGNOSIS — J40 Bronchitis, not specified as acute or chronic: Secondary | ICD-10-CM | POA: Diagnosis not present

## 2017-09-30 MED ORDER — PSEUDOEPH-BROMPHEN-DM 30-2-10 MG/5ML PO SYRP: 5.0000 mL | ORAL_SOLUTION | Freq: Four times a day (QID) | ORAL | 0 refills | Status: DC | PRN

## 2017-09-30 MED ORDER — PREDNISONE 10 MG PO TABS: ORAL_TABLET | ORAL | 0 refills | Status: DC

## 2017-09-30 NOTE — ED Notes (Signed)
Pt with cough, clear secretions, clear breath sounds, some nasal congestion. Hx asthma, using inhalers/nebs as needed.

## 2017-09-30 NOTE — ED Triage Notes (Signed)
Patient presents with cough and fever. Reports she did not check fever at home and has not no Ibuprofen or Tylenol. Has been taking OTC cough medicine with no relief

## 2017-09-30 NOTE — ED Provider Notes (Signed)
[facility] Emergency Department Provider Note  ____________________________________________   First MD Initiated Contact with Patient 09/30/17 [phone]     (approximate)  I have reviewed the triage vital signs and the nursing notes.   HISTORY  Chief Complaint Cough and Fever   HPI Kirsten [name] is a 71 y.o. female is here complaining of cough and fever over the weekend the patient states that she has been coughing for the last 2 months. She has been using her inhaler once a day but has been using the nebulizer machine every 4 hours while trying to sleep. Patient is on Axid taken her temperature but has "felt hot". She has been taking over-the-counter medications for cough and congestion without any relief. She denies any nausea or vomiting.   Past Medical History:  Diagnosis Date  . Allergy   . Arthritis   . Asthma   . Back injury   . Depression   . Heart disease   . History of blood clots   . History of chicken pox   . Hyperlipidemia   . Hypertension   . Memory loss   . Migraine headache   . Myocardial infarction (HCC)   . Seizures [facility](HCC)     Patient Active Problem List   Diagnosis Date Noted  . Allergic rhinitis due to allergen 03/01/2017  . Constipation 12/26/2016  . History of bowel resection 12/26/2016  . Depression 11/18/2015  . HA (headache) 11/18/2015  . Memory loss 11/18/2015  . Migraine headache 11/18/2015  . Seizures (HCC) 11/18/2015  . Chronic hip pain 11/18/2015  . Hypertension 09/20/2015  . Hyperlipidemia 09/20/2015  . Chronic pain 09/20/2015  . Heart murmur 09/20/2015  . Asthma without status asthmaticus 09/20/2015  . Post concussion syndrome 11/08/2014  . Restless legs 10/05/2014  . Concussion with loss of consciousness 08/30/2014    Past Surgical History:  Procedure Laterality Date  . ABDOMINAL HYSTERECTOMY    . APPENDECTOMY    . BOWEL RESECTION    . COLONOSCOPY WITH PROPOFOL N/A 06/17/2017   Procedure:  COLONOSCOPY WITH PROPOFOL;  Surgeon: Christena DeemSkulskie, Martin U, MD;  [facility];  Service: Endoscopy;  Laterality: N/A;    Prior to Admission medications   Medication Sig Start Date End Date Taking? Authorizing Provider  albuterol (PROVENTIL HFA) 108 (90 BASE) MCG/ACT inhaler Inhale 2 puffs into the lungs every 6 (six) hours as needed for wheezing or shortness of breath.    [provider]  aspirin EC 81 MG tablet Take 1 tablet (81 mg total) by mouth daily. 09/20/15   Loura PardonKrebs, Amy Lauren, NP  brompheniramine-pseudoephedrine-DM 30-2-10 MG/5ML syrup Take 5 mLs by mouth 4 (four) times daily as needed. 09/30/17   Tommi RumpsSummers, Luise Yamamoto L, PA-C  docusate sodium (COLACE) 100 MG capsule Take 1 capsule (100 mg total) by mouth 2 (two) times daily. 07/17/17 07/17/18  Nita SickleVeronese, Blodgett, MD  losartan (COZAAR) 50 MG tablet Take 50 mg by mouth daily.    [provider]  lubiprostone (AMITIZA) 24 MCG capsule Take 24 mcg by mouth 2 (two) times daily with a meal.    [provider]  meloxicam (MOBIC) 15 MG tablet Take 15 mg by mouth daily.    [provider]  montelukast (SINGULAIR) 10 MG tablet Take 10 mg by mouth at bedtime.    [provider]  polyethylene glycol powder (GLYCOLAX/MIRALAX) powder Take 17 g by mouth 2 (two) times daily as needed for mild constipation or moderate constipation. 12/26/16   Karamalegos,  Netta Neat, DO  predniSONE (DELTASONE) 10 MG tablet Take 6 tablets  today, on day 2 take 5 tablets, day 3 take 4 tablets, day 4 take 3 tablets, day 5 take  2 tablets and 1 tablet the last day 09/30/17   Tommi Rumps, PA-C  rosuvastatin (CRESTOR) 20 MG tablet Take 20 mg by mouth daily.    [provider]  senna (SENOKOT) 8.6 MG TABS tablet Take 1 tablet (8.6 mg total) by mouth daily. 07/17/17   Nita Sickle, MD    Allergies Amlodipine  Family History  Problem Relation Age of Onset  . Cancer Father     Social History Social History    Tobacco Use  . Smoking status: Never Smoker  . Smokeless tobacco: Never Used  Substance Use Topics  . Alcohol use: No    Alcohol/week: 0.0 oz  . Drug use: No    Review of Systems Constitutional: subjective fever/chills Eyes: No visual changes. ENT: No sore throat.  Negative for ear pain. Negative for sinus pain. Cardiovascular: Denies chest pain. Respiratory: Denies shortness of breath. Positive for nonproductive cough. Gastrointestinal:   No nausea, no vomiting.   Musculoskeletal: Negative for back pain. Skin: Negative for rash. Neurological: Negative for headaches, focal weakness or numbness. ___________________________________________   PHYSICAL EXAM:  VITAL SIGNS: ED Triage Vitals  Enc Vitals Group     BP 09/30/17 0827 (!) 149/85     Pulse Rate 09/30/17 0827 80     Resp 09/30/17 0827 16     Temp 09/30/17 0827 98.2 F (36.8 C)     Temp Source 09/30/17 0827 Oral     SpO2 09/30/17 0827 99 %     Weight 09/30/17 0825 118 lb (53.5 kg)     Height 09/30/17 0825 5' (1.524 m)     Head Circumference --      Peak Flow --      Pain Score --      Pain Loc --      Pain Edu? --      Excl. in GC? --    Constitutional: Alert and oriented. Well appearing and in no acute distress. Eyes: Conjunctivae are normal.  Head: Atraumatic. Nose: moderate congestion/rhinnorhea. EACs are clear. TMs are dull bilaterally. Mouth/Throat: Mucous membranes are moist.  Oropharynx non-erythematous.moderate posterior drainage seen. Neck: No stridor.   Hematological/Lymphatic/Immunilogical: No cervical lymphadenopathy. Cardiovascular: Normal rate, regular rhythm. Grossly normal heart sounds.  Good peripheral circulation. Respiratory: Normal respiratory effort.  No retractions. Lungs CTAB. No wheezing noted and patient is able to speak in complete sentences without any difficulty. Musculoskeletal: moves upper and lower extremities without any difficulty and normal gait was noted. Neurologic:   Normal speech and language. No gross focal neurologic deficits are appreciated.  Skin:  Skin is warm, dry and intact. No rash noted. Psychiatric: Mood and affect are normal. Speech and behavior are normal.  ____________________________________________   LABS (all labs ordered are listed, but only abnormal results are displayed)  Labs Reviewed - No data to display  RADIOLOGY  Dg Chest 2 View  Result Date: 09/30/2017 CLINICAL DATA:  Cough for 2 months.  Intermittent fever. EXAM: CHEST  2 VIEW COMPARISON:  09/13/2014 FINDINGS: The heart size and mediastinal contours are within normal limits. Aortic atherosclerosis. Both lungs are clear. Mild upper thoracic degenerative disc disease has increased since previous study. Old left lateral sixth rib fracture deformity again seen. IMPRESSION: No active cardiopulmonary disease. Electronically Signed   By: Alver Sorrow.D.  On: 09/30/2017 09:24    ____________________________________________   PROCEDURES  Procedure(s) performed: None  Procedures  Critical Care performed: No  ____________________________________________   INITIAL IMPRESSION / ASSESSMENT AND PLAN / ED COURSE Patient was made aware that her chest x-ray was negative. She will continue with her inhaler however she is only been using it once a day. She is encouraged to increase to 2 puffs every 6 hours. Use nebulizer machine at night as needed. She was given a prescription for Bromfed DM and prednisone 60 mg tapering dose.  ____________________________________________   FINAL CLINICAL IMPRESSION(S) / ED DIAGNOSES  Final diagnoses:  Bronchitis  Upper respiratory tract infection, unspecified type     ED Discharge Orders        Ordered    predniSONE (DELTASONE) 10 MG tablet     09/30/17 0944    brompheniramine-pseudoephedrine-DM 30-2-10 MG/5ML syrup  4 times daily PRN     09/30/17 0944       Note:  This document was prepared using Dragon voice recognition  software and may include unintentional dictation errors.    Tommi RumpsSummers, Shandel Busic L, PA-C 09/30/17 [id number]    Willy Eddyobinson, Patrick, MD 09/30/17 [phone]

## 2017-09-30 NOTE — Discharge Instructions (Signed)
Follow-up with your primary care provider if any continued problems. Continue using her inhaler 2 puffs every 6 hours. Begin taking prednisone as directed and Bromfed DM as needed for cough and congestion. Take Tylenol or ibuprofen as needed for fever or muscle aches. Continue your nebulizer treatments only as needed

## 2017-10-02 ENCOUNTER — Other Ambulatory Visit: Payer: Self-pay

## 2017-10-02 DIAGNOSIS — J45909 Unspecified asthma, uncomplicated: Secondary | ICD-10-CM | POA: Diagnosis not present

## 2017-10-02 NOTE — Patient Outreach (Signed)
Outreach patient after ED visit [facility].  There was no answer but I left a voicemail message asking for a return call.  Unsuccessful letter, magnet and know before you go mailed on 10/02/17.

## 2017-10-03 ENCOUNTER — Other Ambulatory Visit: Payer: Self-pay

## 2017-10-03 ENCOUNTER — Other Ambulatory Visit: Payer: Self-pay | Admitting: *Deleted

## 2017-10-03 ENCOUNTER — Encounter: Payer: Self-pay | Admitting: *Deleted

## 2017-10-03 NOTE — Patient Outreach (Signed)
[facility](THN) Care Management  10/03/2017  Kirsten CopasMary F [name] 1946/02/20 [id number]   Patient referred to this social worker by telephonic RNCM to provide patient with community resources for transportation and food. Patient not available, voicemail message left for a return call.   Plan: This Child psychotherapistsocial worker will contact patient within 1 week.   Adriana ReamsChrystal Melena Hayes, LCSW [facility] Care Management [phone]
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
## 2017-10-04 NOTE — Telephone Encounter (Signed)
This encounter was created in error - please disregard.

## 2017-10-07 ENCOUNTER — Other Ambulatory Visit: Payer: Self-pay | Admitting: Pharmacist

## 2017-10-07 ENCOUNTER — Other Ambulatory Visit: Payer: Self-pay | Admitting: *Deleted

## 2017-10-07 NOTE — Patient Outreach (Signed)
[facility](THN) Care Management  [facility]   10/07/2017  Kirsten CopasMary F [name] 1946/07/17 [id number]  71 year old female [facility] by [facility].  [facility] services requested for medication cost assistance with a cough syrup.  PMHx includes, but not limited to, seizures, CAD, VTEs, HTN, HLD, asthma, allergies, asthma, depression, and memory loss.  Noted patient recently diagnosed with bronchitis and was prescribed bromfed DM (bromphineramine, pseudoephedrine, and dextromethorphan) for symptom relief. This includes an antihistamine, decongestant, and cough suppressant.    Unsuccessful telephone call to Kirsten [name] today.  I left a HIPPA compliant voicemail on the home phone.    Plan: I will follow-up with Kirsten [name] regarding her cough syrup cost tomorrow.   Kirsten [name], PharmD, [facility] Clinical Pharmacist [facility] [phone]

## 2017-10-07 NOTE — Patient Outreach (Signed)
[facility](THN) Care Management  10/07/2017  Kirsten CopasMary F [name] 1946-01-06 [id number]   Patient referred to this social worker by telephonic RNCM to provide patient with community resources for transportation and food. Second attempt to reach patient. Patient not available, voicemail message left for a return call.   Plan: This Child psychotherapistsocial worker will contact patient within 1 week.

## 2017-10-08 ENCOUNTER — Ambulatory Visit: Payer: Self-pay | Admitting: Pharmacist

## 2017-10-08 ENCOUNTER — Other Ambulatory Visit: Payer: Self-pay | Admitting: Pharmacist

## 2017-10-08 NOTE — Patient Outreach (Signed)
[facility](THN) Care Management  10/08/2017  Kirsten CopasMary F [name] [birth year] [id number]   Unsuccessful telephone call to Kirsten [name] today.  I left a HIPPA compliant voicemail on the home phone.    Plan: I will follow-up with Kirsten [name] later this week regarding medication assistance.   Haynes Hoehnolleen Meyli Boice, PharmD, [facility] Clinical Pharmacist [facility] [phone]

## 2017-10-10 ENCOUNTER — Other Ambulatory Visit: Payer: Self-pay | Admitting: Pharmacist

## 2017-10-10 ENCOUNTER — Ambulatory Visit: Payer: Self-pay | Admitting: Pharmacist

## 2017-10-10 NOTE — Patient Outreach (Signed)
[facility](THN) Care Management  10/10/2017  Kirsten CopasMary F [name] Apr 04, 1946 [id number]  Third unsuccessful telephone attempt to reach Ms. Kirsten ReefMary [name] regarding medication assistance.  I left a HIPAA compliant voicemail on her home phone.   Plan: I will mail [name] a letter describing [name] services and follow-up in 10 business days.  If no response from [name], I will close her pharmacy case at that time.   Haynes Hoehnolleen Lavina Resor, PharmD, [facility] Clinical Pharmacist [facility] [phone]

## 2017-10-11 ENCOUNTER — Other Ambulatory Visit: Payer: Self-pay | Admitting: *Deleted

## 2017-10-11 NOTE — Patient Outreach (Signed)
     [facility](THN) Care Management  10/11/2017  Kirsten CopasMary F [name] 25-Dec-1945 [id number]    Patient referred to this social worker by telephonic RNCM to provide patient with community resources for transportation and food. Third attempt to reach patient, patient not available, voicemail message left requesting  a return call.   This Child psychotherapistsocial worker will send a contact letter to patient. If patient does not return the call within 10 business days, case will be closed to social work.     Adriana ReamsChrystal Farron Watrous, LCSW [facility] Care Management [phone]

## 2017-10-11 NOTE — Patient Outreach (Signed)
[facility](THN) Care Management  10/11/2017  Kirsten CopasMary F [name] 02-12-1946 [id number]   Return phone  call from patient who states that she has now had her car repaired and is able to get to her own medical appointments. Patient also states that she does not need food resources at this time as she has begun to utilize the food pantry in [name]. Per patient, she went there today and was given a few bags of food.  Patient verbalized having no additional community resource needs at this time. Patient to be closed to social work at this time.   Adriana ReamsChrystal Kyllie Pettijohn, LCSW [facility] Care Management [phone]

## 2017-10-14 ENCOUNTER — Telehealth: Payer: Self-pay | Admitting: Pharmacist

## 2017-10-14 NOTE — Patient Outreach (Signed)
[facility](THN) Care Management  10/14/2017  Kirsten CopasMary F [name] December 10, 1945 [id number]   Received message that [name] called [facility] main phone line to return my call.  I tried to reach out to [name] again this morning but was unable to contact her.  I left a HIPAA compliant voicemail on her home phone.   Plan: I will follow-up again with [name] later this week unless I hear back from her beforehand.   Haynes Hoehnolleen Klaira Pesci, PharmD, [facility] Clinical Pharmacist [facility] [phone]

## 2017-10-15 ENCOUNTER — Other Ambulatory Visit: Payer: Self-pay | Admitting: Pharmacist

## 2017-10-15 ENCOUNTER — Ambulatory Visit: Payer: Self-pay | Admitting: Pharmacist

## 2017-10-15 NOTE — Patient Outreach (Signed)
[facility](THN) Care Management  10/15/2017  Kirsten [name] April 20, 1946 [id number]   [age] year old [facility].  [facility] services requested for medication cost assistance with a cough syrup.  PMHx includes, but not limited to, seizures, CAD, VTEs, HTN, HLD, asthma, allergies, asthma, depression, and memory loss.  Noted patient recently diagnosed with bronchitis and was prescribed bromfed DM (bromphineramine, pseudoephedrine, and dextromethorphan) for symptom relief. This includes an antihistamine, decongestant, and cough suppressant.    Subjective:  Successful telephone call with Kirsten [name] this morning.  HIPAA identifiers verified. Kirsten [name] reports she has had a cold for several weeks which is not improving.  She reports she has not made a follow-up appointment with her PCP because he doesn't know her history.  She reports she has been using the cough syrup, bromfed DM, about 2x a day and it helps her symptoms.  She also reports she has been using an albuterol nebulizer which has not helped her breathing.  She reports she feels shaky and nervous after using it.    Objective:   Medications Reviewed Today    Reviewed by Eather ColasSummers, Rhonda L, PA-C (Physician Assistant Certified) on 09/30/17 at 0940  Med List Status: <None>  Medication Order Taking? Sig Documenting Provider Last Dose Status Informant  albuterol (PROVENTIL HFA) 108 (90 BASE) MCG/ACT inhaler [id number]  Inhale 2 puffs into the lungs every 6 (six) hours as needed for wheezing or shortness of breath. [provider]  Active Self  aspirin EC 81 MG tablet [id number]  Take 1 tablet (81 mg total) by mouth daily. Loura PardonKrebs, Amy Lauren, NP  Active Self  docusate sodium (COLACE) 100 MG capsule [id number]  Take 1 capsule (100 mg total) by mouth 2 (two) times daily. Don PerkingVeronese, [place], MD  Active   losartan (COZAAR) 50 MG tablet [id number]  Take 50 mg by mouth daily.  [provider]  Active   lubiprostone (AMITIZA) 24 MCG capsule [id number]  Take 24 mcg by mouth 2 (two) times daily with a meal. [provider]  Active Self  meloxicam (MOBIC) 15 MG tablet [id number]  Take 15 mg by mouth daily. [provider]  Active Self  montelukast (SINGULAIR) 10 MG tablet [id number]  Take 10 mg by mouth at bedtime. [provider]  Active Self  polyethylene glycol powder (GLYCOLAX/MIRALAX) powder [id number]  Take 17 g by mouth 2 (two) times daily as needed for mild constipation or moderate constipation. Smitty CordsKaramalegos, Alexander J, DO  Active Self  rosuvastatin (CRESTOR) 20 MG tablet [id number]  Take 20 mg by mouth daily. [provider]  Active Self  senna (SENOKOT) 8.6 MG TABS tablet [id number]  Take 1 tablet (8.6 mg total) by mouth daily. Don PerkingVeronese, [place], MD  Active          Assessment: Patient has on-going cold symptoms that have lasted almost 2 months.  I encouraged Kirsten [name] to make an appointment with her primary care provider, Dr. Althea CharonKaramalegos, for recommendations.  Kirsten [name] voiced understanding.   Medication assistance: Patient reports paying out of pocket $36 for her cough syrup, Bromfed DM.  I reviewed the product with Kirsten [name] and counseled her on each individual component.  We discussed that each individual medication is OTC and she may have cost savings by buying medications separately. Her insurance will not cover OTC medications.  Kirsten [name] voiced understanding.    Kirsten [name] denies a medication review at this time.  She  does not have any other medication questions or concerns.    Plan: [facility] will sign-off patient case at this time.  I will send an MD closure letter and alert [facility] CMA.   Haynes Hoehnolleen Mayar Whittier, PharmD, [facility] Clinical Pharmacist [facility] [phone]

## 2017-10-24 ENCOUNTER — Ambulatory Visit: Payer: Self-pay | Admitting: Pharmacist

## 2017-10-24 DIAGNOSIS — J45909 Unspecified asthma, uncomplicated: Secondary | ICD-10-CM | POA: Diagnosis not present

## 2017-11-07 ENCOUNTER — Encounter: Payer: Self-pay | Admitting: *Deleted

## 2017-11-18 DIAGNOSIS — J45909 Unspecified asthma, uncomplicated: Secondary | ICD-10-CM | POA: Diagnosis not present

## 2017-11-20 ENCOUNTER — Ambulatory Visit: Payer: Medicare Other | Admitting: Family Medicine

## 2017-12-10 ENCOUNTER — Encounter: Payer: Self-pay | Admitting: Family Medicine

## 2017-12-10 ENCOUNTER — Ambulatory Visit (INDEPENDENT_AMBULATORY_CARE_PROVIDER_SITE_OTHER): Payer: Medicare Other | Admitting: Family Medicine

## 2017-12-10 ENCOUNTER — Ambulatory Visit
Admission: RE | Admit: 2017-12-10 | Discharge: 2017-12-10 | Disposition: A | Discharge status: Home / Self Care | Payer: Medicare Other | Source: Ambulatory Visit | Attending: Family Medicine | Admitting: Family Medicine

## 2017-12-10 VITALS — BP 142/78 | HR 57 | Temp 98.5°F | Resp 16 | Ht 60.0 in | Wt 119.8 lb

## 2017-12-10 DIAGNOSIS — R05 Cough: Secondary | ICD-10-CM | POA: Insufficient documentation

## 2017-12-10 DIAGNOSIS — J209 Acute bronchitis, unspecified: Secondary | ICD-10-CM | POA: Diagnosis not present

## 2017-12-10 DIAGNOSIS — R053 Chronic cough: Secondary | ICD-10-CM

## 2017-12-10 DIAGNOSIS — J3089 Other allergic rhinitis: Secondary | ICD-10-CM | POA: Diagnosis not present

## 2017-12-10 DIAGNOSIS — I10 Essential (primary) hypertension: Secondary | ICD-10-CM | POA: Diagnosis not present

## 2017-12-10 DIAGNOSIS — J4520 Mild intermittent asthma, uncomplicated: Secondary | ICD-10-CM | POA: Diagnosis not present

## 2017-12-10 MED ORDER — LORATADINE 10 MG PO TABS: 10.0000 mg | ORAL_TABLET | Freq: Every day | ORAL | 11 refills | Status: DC

## 2017-12-10 MED ORDER — MONTELUKAST SODIUM 10 MG PO TABS: 10.0000 mg | ORAL_TABLET | Freq: Every day | ORAL | 5 refills | Status: DC

## 2017-12-10 MED ORDER — LOSARTAN POTASSIUM 50 MG PO TABS: 50.0000 mg | ORAL_TABLET | Freq: Every day | ORAL | 3 refills | Status: DC

## 2017-12-10 MED ORDER — AZITHROMYCIN 250 MG PO TABS: ORAL_TABLET | ORAL | 0 refills | Status: DC

## 2017-12-10 MED ORDER — PREDNISONE 50 MG PO TABS: 50.0000 mg | ORAL_TABLET | Freq: Every day | ORAL | 0 refills | Status: DC

## 2017-12-10 MED ORDER — FLUTICASONE PROPIONATE 50 MCG/ACT NA SUSP: 2.0000 | Freq: Every day | NASAL | 3 refills | Status: DC

## 2017-12-10 NOTE — Assessment & Plan Note (Signed)
Similar to last time, still elevated BP, mild improve on manual re-check, despite frequent coughing - Still concerns for medication non adherence, however she admits to compliance. - Home BP readings - None  No known complications Failed ACEi (Cough), Amlodipine (HA)    Plan:  1. Continue current BP regimen - Losartan 50mg  daily still do not have home readings - anticipate will need to increase to Losartan 100mg  in future if still elevated, now with respiratory problem will defer for now 2. Encourage improved lifestyle - low sodium diet, regular exercise 3. Start monitor BP outside office, bring readings to next visit, if persistently >140/90 or new symptoms notify office sooner 4. Follow-up 3 months HTN / med adjust

## 2017-12-10 NOTE — Progress Notes (Signed)
Subjective:    Patient ID: Kirsten [name], female    DOB: [birth year], 72 y.o.   MRN: [id number]  Kirsten [name] is a 72 y.o. female presenting on 12/10/2017 for Hypertension   HPI   CHRONIC HTN: Reports that she has not been keeping track of BP regularly. Previously was elevated, last time seen by me 05/2017 Current Meds - Losartan 50mg  - needs refill today - Prior meds ACEi cough and allergy eye swelling on amlodipine Reports good compliance, took meds today. Tolerating well, w/o complaints. Denies CP, dyspnea, HA, edema, dizziness / lightheadedness  FOLLOW-UP Bronchitis Last seen in hospital ED 09/30/17 for bronchitis and she says was dx with influenza, given prednisone, cough syrup rx, flonase. She did well initially but then seemed to have persistent lingering cough. She now describes worsening cough, has spells sometimes productive, some chest congestion or more tightness with her breathing at times. She thinks allergies and living situation is affecting her breathing with dust and other debris affecting her. - She has history of asthma, has been using albuterol and nebulizer at home as needed with relief - She has stopped loratadine or anti histamine, and no longer taking flonase, needs refill - She was taking Singulair, unsure if still taking, may have needed med assistance with this - Denies fevers/chills, sweats, chest pain or tightness, nausea vomiting, muscle aches, body aches, hemoptysis  Depression screen [facility] 2/9 12/10/2017 10/03/2017 04/09/2017  Decreased Interest 0 0 0  Down, Depressed, Hopeless 0 0 0  PHQ - 2 Score 0 0 0    Social History   Tobacco Use  . Smoking status: Never Smoker  . Smokeless tobacco: Never Used  Substance Use Topics  . Alcohol use: No    Alcohol/week: 0.0 oz  . Drug use: No    Review of Systems Per HPI unless specifically indicated above     Objective:    BP (!) 142/78 (BP Location: Left Arm, Cuff Size: Normal)   Pulse (!) 57   Temp  98.5 F (36.9 C) (Oral)   Resp 16   Ht 5' (1.524 m)   Wt 119 lb 12.8 oz (54.3 kg)   BMI 23.40 kg/m   Wt Readings from Last 3 Encounters:  12/10/17 119 lb 12.8 oz (54.3 kg)  09/30/17 118 lb (53.5 kg)  07/17/17 118 lb (53.5 kg)    Physical Exam  Constitutional: She is oriented to person, place, and time. She appears well-developed and well-nourished. No distress.  Well-appearing, comfortable, cooperative  HENT:  Head: Normocephalic and atraumatic.  Mouth/Throat: Oropharynx is clear and moist.  Eyes: Conjunctivae are normal. Right eye exhibits no discharge. Left eye exhibits no discharge.  Cardiovascular: Normal rate, regular rhythm, normal heart sounds and intact distal pulses.  No murmur heard. Pulmonary/Chest: Effort normal. No respiratory distress. She has no wheezes. She has no rales.  Mild reduced air movement diffusely, frequent coughing spells non productive with tight breathing at times. No focal areas of crackles or wheezing. Some transmitted upper airway sounds clear with cough.  Musculoskeletal: She exhibits no edema.  Lymphadenopathy:    She has no cervical adenopathy.  Neurological: She is alert and oriented to person, place, and time.  Skin: Skin is warm and dry. No rash noted. She is not diaphoretic. No erythema.  Psychiatric: Her behavior is normal.  Well groomed, good eye contact, normal speech and thoughts  Nursing note and vitals reviewed.    I have personally reviewed the radiology report from 12/10/17 Chest  X-ray.  CLINICAL DATA:  Chronic cough  EXAM: CHEST  2 VIEW  COMPARISON:  09/30/2017  FINDINGS: Aortic calcifications. Heart and mediastinal contours are within normal limits. No focal opacities or effusions. No acute bony abnormality.  IMPRESSION: No active cardiopulmonary disease.   Electronically Signed   By: Charlett NoseKevin  Dover M.D.   On: 12/10/2017 11:52  Results for orders placed or performed during the hospital encounter of 07/17/17    Lipase, blood  Result Value Ref Range   Lipase 29 11 - 51 U/L  Comprehensive metabolic panel  Result Value Ref Range   Sodium 140 135 - 145 mmol/L   Potassium 3.3 (L) 3.5 - 5.1 mmol/L   Chloride 108 101 - 111 mmol/L   CO2 23 22 - 32 mmol/L   Glucose, Bld 100 (H) 65 - 99 mg/dL   BUN 8 6 - 20 mg/dL   Creatinine, Ser 1.610.82 0.44 - 1.00 mg/dL   Calcium 9.1 8.9 - 09.610.3 mg/dL   Total Protein 6.6 6.5 - 8.1 g/dL   Albumin 3.6 3.5 - 5.0 g/dL   AST 46 (H) 15 - 41 U/L   ALT 41 14 - 54 U/L   Alkaline Phosphatase 64 38 - 126 U/L   Total Bilirubin 0.5 0.3 - 1.2 mg/dL   GFR calc non Af Amer >60 >60 mL/min   GFR calc Af Amer >60 >60 mL/min   Anion gap 9 5 - 15  CBC  Result Value Ref Range   WBC 9.3 3.6 - 11.0 K/uL   RBC 4.53 3.80 - 5.20 MIL/uL   Hemoglobin 12.0 12.0 - 16.0 g/dL   HCT 04.535.9 40.935.0 - 81.147.0 %   MCV 79.4 (L) 80.0 - 100.0 fL   MCH 26.6 26.0 - 34.0 pg   MCHC 33.5 32.0 - 36.0 g/dL   RDW 91.415.5 (H) 78.211.5 - 95.614.5 %   Platelets 288 150 - 440 K/uL  Urinalysis, Complete w Microscopic  Result Value Ref Range   Color, Urine COLORLESS (A) YELLOW   APPearance CLEAR (A) CLEAR   Specific Gravity, Urine 1.002 (L) 1.005 - 1.030   pH 6.0 5.0 - 8.0   Glucose, UA NEGATIVE NEGATIVE mg/dL   Hgb urine dipstick SMALL (A) NEGATIVE   Bilirubin Urine NEGATIVE NEGATIVE   Ketones, ur NEGATIVE NEGATIVE mg/dL   Protein, ur NEGATIVE NEGATIVE mg/dL   Nitrite NEGATIVE NEGATIVE   Leukocytes, UA NEGATIVE NEGATIVE   RBC / HPF NONE SEEN 0 - 5 RBC/hpf   WBC, UA 0-5 0 - 5 WBC/hpf   Bacteria, UA NONE SEEN NONE SEEN   Squamous Epithelial / LPF NONE SEEN NONE SEEN      Assessment & Plan:   Problem List Items Addressed This Visit    Allergic rhinitis due to allergen   Relevant Medications   loratadine (CLARITIN) 10 MG tablet   fluticasone (FLONASE) 50 MCG/ACT nasal spray   Asthma without status asthmaticus   Relevant Medications   montelukast (SINGULAIR) 10 MG tablet   predniSONE (DELTASONE) 50 MG tablet    Hypertension    Similar to last time, still elevated BP, mild improve on manual re-check, despite frequent coughing - Still concerns for medication non adherence, however she admits to compliance. - Home BP readings - None  No known complications Failed ACEi (Cough), Amlodipine (HA)    Plan:  1. Continue current BP regimen - Losartan 50mg  daily still do not have home readings - anticipate will need to increase to Losartan 100mg  in future if  still elevated, now with respiratory problem will defer for now 2. Encourage improved lifestyle - low sodium diet, regular exercise 3. Start monitor BP outside office, bring readings to next visit, if persistently >140/90 or new symptoms notify office sooner 4. Follow-up 3 months HTN / med adjust      Relevant Medications   losartan (COZAAR) 50 MG tablet    Other Visit Diagnoses    Bronchitis with bronchospasm    -  Primary   Relevant Medications   azithromycin (ZITHROMAX Z-PAK) 250 MG tablet   predniSONE (DELTASONE) 50 MG tablet   Chronic cough       Relevant Orders   DG Chest 2 View (Completed)      Consistent with worsening persistent bronchitis >1-2 months now, with recurrence or incomplete resolution, likely longer flare with her symptoms of bronchospasm due to asthma, without acute asthma exacerbation today - Likely prior URI bronchitis. No evidence of influenza. Also likely allergic component triggering symptoms various factors including living situation  Plan: 1. Check STAT CXR today - reviewed results, negative for acute pneumonia or infection or other cardio/pulm, patient to be notified of results - Will treat empirically for asthma / bronchitis flare - Azithromycin Z-pak dosing 500mg  then 250mg  daily x 4 days - Start Prednisone 50mg  daily x 5 days burst treatment given bronchospasm - Use Albuterol inhaler 2 puffs q 4-6 hour x 3-5 days regularly - Resume Loratadine (Claritin) 10mg  daily and Flonase 2 sprays in each nostril daily for  next 4-6 weeks, then may stop and use seasonally or as needed - Refilled Singulair Return criteria reviewed, follow-up within 1 week if not improved  May need referral to Pulmonology vs Asthma in future if not improving still or recurrent flares.   Meds ordered this encounter  Medications  . montelukast (SINGULAIR) 10 MG tablet    Sig: Take 1 tablet (10 mg total) by mouth at bedtime.    Dispense:  30 tablet    Refill:  5  . loratadine (CLARITIN) 10 MG tablet    Sig: Take 1 tablet (10 mg total) by mouth daily. Use for 4-6 weeks then stop, and use as needed or seasonally    Dispense:  30 tablet    Refill:  11  . fluticasone (FLONASE) 50 MCG/ACT nasal spray    Sig: Place 2 sprays into both nostrils daily. Use for 4-6 weeks then stop and use seasonally or as needed.    Dispense:  16 g    Refill:  3  . losartan (COZAAR) 50 MG tablet    Sig: Take 1 tablet (50 mg total) by mouth daily.    Dispense:  90 tablet    Refill:  3  . azithromycin (ZITHROMAX Z-PAK) 250 MG tablet    Sig: Take 2 tabs (500mg  total) on Day 1. Take 1 tab (250mg ) daily for next 4 days.    Dispense:  6 tablet    Refill:  0  . predniSONE (DELTASONE) 50 MG tablet    Sig: Take 1 tablet (50 mg total) by mouth daily with breakfast.    Dispense:  5 tablet    Refill:  0    Follow up plan: Return in about 3 months (around 03/09/2018) for HTN.  Saralyn Pilar, DO [facility] 12/10/2017, 1:58 PM

## 2017-12-10 NOTE — Patient Instructions (Addendum)
Thank you for coming to the office today.  1. It sounds like you had an Upper Respiratory Virus that has settled into a Bronchitis, lower respiratory tract infection. I don't have concerns for pneumonia today, and think that this should gradually improve. Once you are feeling better, the cough may take a few weeks to fully resolve. I do hear wheezing and coarse breath sounds, this may be due to the virus, also could be from dust or environmental triggers  You could have chronic cough for weeks to months after Flu as well  Chest X-ray today - stay tuned for results  Likely will send new rx - Prednisone 50mg  daily for next 5 days - this will open up lungs allow you to breath better and treat that wheezing or bronchospasm  Also will send an antibiotic pending result  - Use Albuterol inhaler 2 puffs every 4-6 hours around the clock for next 2-3 days, max up to 5 days then use as needed   - Start Loratadine (Claritin) 10mg  daily and Flonase 2 sprays in each nostril daily for next 4-6 weeks, then you may stop and use seasonally or as needed  - Use nasal saline (Simply Saline or Ocean Spray) to flush nasal congestion multiple times a day, may help cough - Drink plenty of fluids to improve congestion  Follow-up 2-4 weeks if breathing not improved, will refer to Pulmonology, otherwise 3 months for HTN  If your symptoms seem to worsen instead of improve over next several days, including significant fever / chills, worsening shortness of breath, worsening wheezing, or nausea / vomiting and can't take medicines - return sooner or go to hospital Emergency Department for more immediate treatment.   Please schedule a Follow-up Appointment to: Return in about 3 months (around 03/09/2018) for HTN.    If you have any other questions or concerns, please feel free to call the office or send a message through MyChart. You may also schedule an earlier appointment if necessary.  Additionally, you may be  receiving a survey about your experience at our office within a few days to 1 week by e-mail or mail. We value your feedback.  Saralyn PilarAlexander Markeesha Char, DO [facility]

## 2017-12-12 ENCOUNTER — Encounter: Payer: Self-pay | Admitting: Family Medicine

## 2017-12-18 DIAGNOSIS — J45909 Unspecified asthma, uncomplicated: Secondary | ICD-10-CM | POA: Diagnosis not present

## 2018-01-07 ENCOUNTER — Ambulatory Visit (INDEPENDENT_AMBULATORY_CARE_PROVIDER_SITE_OTHER): Payer: Medicare Other | Admitting: Family Medicine

## 2018-01-07 ENCOUNTER — Encounter: Payer: Self-pay | Admitting: Family Medicine

## 2018-01-07 VITALS — BP 140/85 | HR 65 | Temp 98.2°F | Resp 16 | Ht 60.0 in | Wt 116.0 lb

## 2018-01-07 DIAGNOSIS — Z0000 Encounter for general adult medical examination without abnormal findings: Secondary | ICD-10-CM

## 2018-01-07 NOTE — Progress Notes (Signed)
Patient here too soon for her next apt. She was last seen 12/10/17 for acute visit for Bronchitis. She was asked to monitor BP outside office especially once she was feeling better from respiratory illness, and follow-up for HTN in 3 months, in 03/2018.  Her respiratory symptoms have resolved. She was asked to come in sooner if these did not improve.  She scheduled for an "Annual Wellness Physical" but was already seen by [name] LPN for AMW in 1/61096/2018. She is not due for this.  She has not had any labs recently, last done 07/2017. She is not due for repeat blood work.  She has forms today with her that we have already filled out but she needed help filling out her own personal portion of form and our office information, this assistance was provided in the waiting room for patient by our staff.  She was not evaluated today by me, we have re-checked her BP with some improvement.   Advised that she needs to monitor BP outside office, keep log so we can review this next time. She should re-schedule today apt for 2 months now in May 2019 for HTN.  Kirsten PilarAlexander Karamalegos, DO [facility] 01/07/2018, 2:24 PM

## 2018-01-13 DIAGNOSIS — J45909 Unspecified asthma, uncomplicated: Secondary | ICD-10-CM | POA: Diagnosis not present

## 2018-01-16 IMAGING — CR DG ABDOMEN 1V
1 series · 1 of 1 positions shown · non-contrast
Comparison: CT 10/19/2016

CLINICAL DATA: Abdominal pain.

EXAM:
ABDOMEN - 1 VIEW

[abdomen kub]
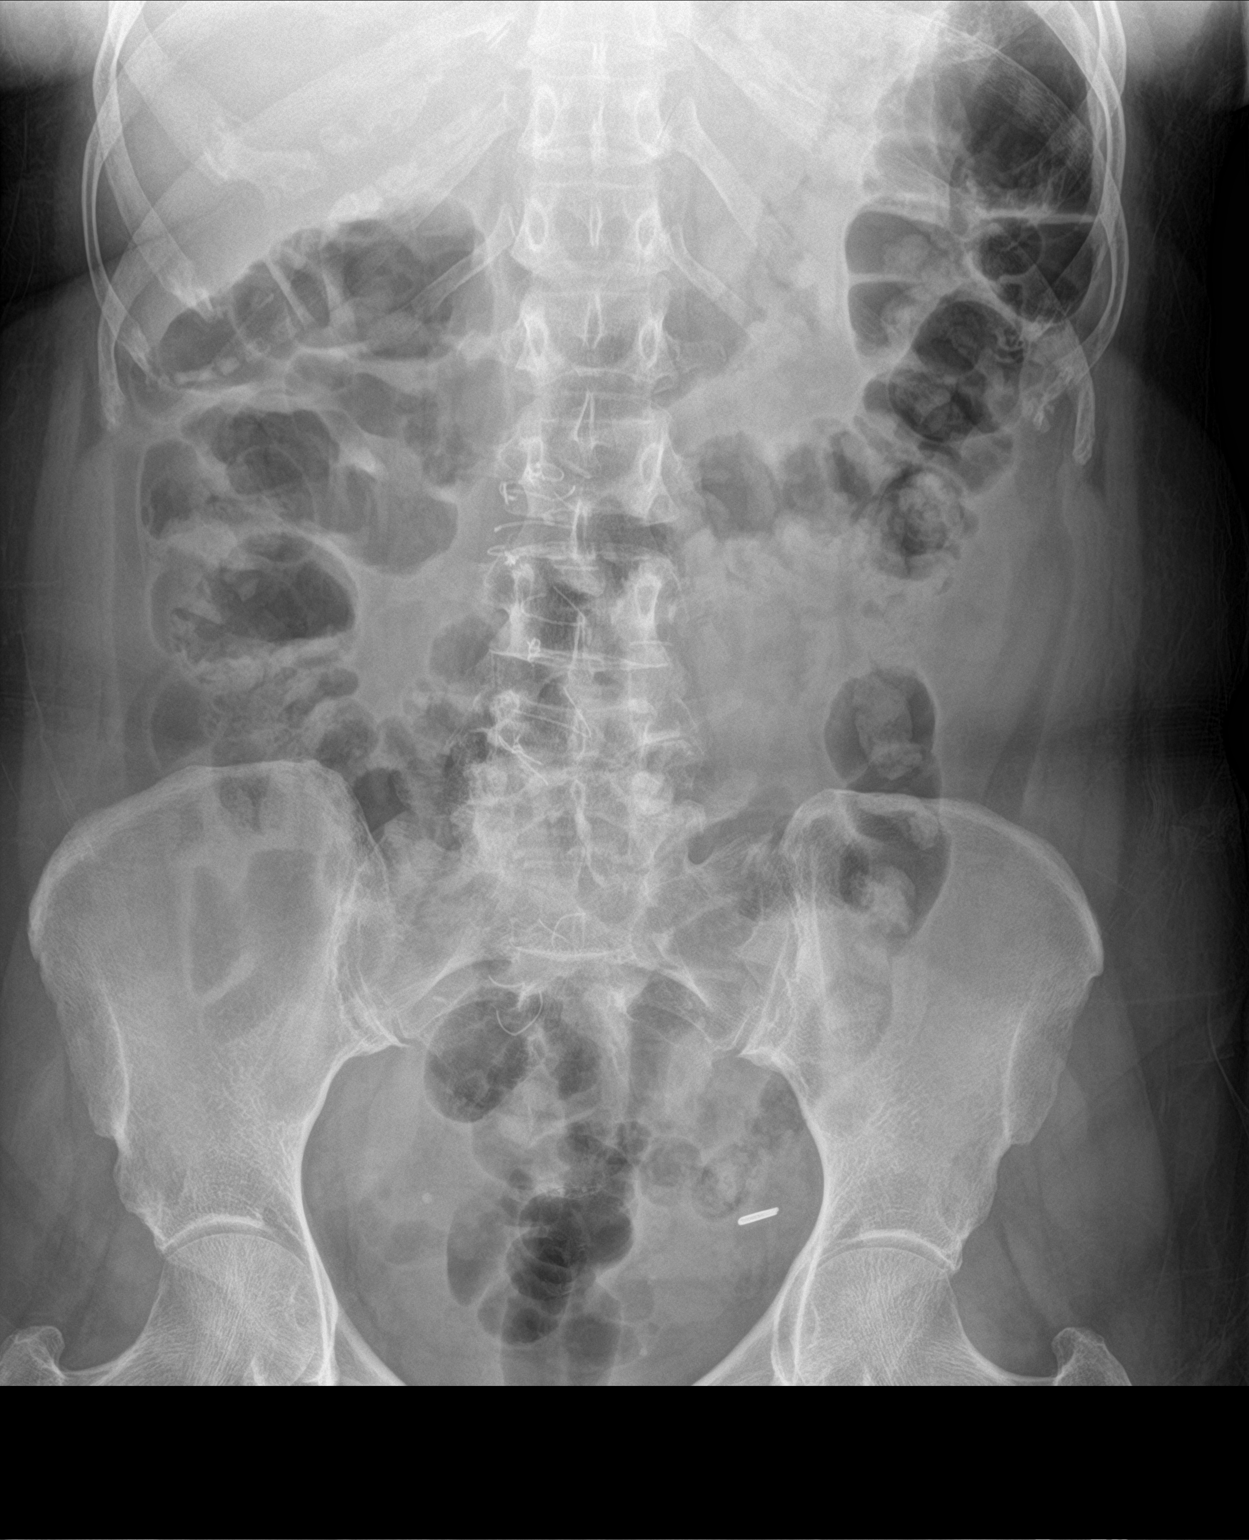

[1 of 1 positions shown; findings below may reference images not displayed]

FINDINGS: Normal bowel gas pattern. Small to moderate colonic stool burden. No
bowel dilatation to suggest obstruction. No evidence of free air.
Surgical clip in the left pelvis with midline abdominal sutures.
Calcification in the right pelvis is a phlebolith. No radiopaque
calculi. Osseous structures are intact.
IMPRESSION: Normal bowel gas pattern with small to moderate colonic stool
burden.

## 2018-02-12 DIAGNOSIS — J45909 Unspecified asthma, uncomplicated: Secondary | ICD-10-CM | POA: Diagnosis not present

## 2018-03-10 ENCOUNTER — Ambulatory Visit: Payer: Medicare Other | Admitting: Family Medicine

## 2018-03-14 DIAGNOSIS — J45909 Unspecified asthma, uncomplicated: Secondary | ICD-10-CM | POA: Diagnosis not present

## 2018-04-14 DIAGNOSIS — J45909 Unspecified asthma, uncomplicated: Secondary | ICD-10-CM | POA: Diagnosis not present

## 2018-04-15 ENCOUNTER — Ambulatory Visit (INDEPENDENT_AMBULATORY_CARE_PROVIDER_SITE_OTHER): Payer: Medicare Other

## 2018-04-15 VITALS — BP 172/76 | HR 62 | Temp 98.7°F | Resp 16 | Ht 60.0 in | Wt 119.4 lb

## 2018-04-15 DIAGNOSIS — Z0000 Encounter for general adult medical examination without abnormal findings: Secondary | ICD-10-CM

## 2018-04-15 NOTE — Progress Notes (Signed)
Subjective:   Kirsten [name] is a 72 y.o. female who presents for Medicare Annual (Subsequent) preventive examination.  Review of Systems:   Cardiac Risk Factors include: advanced age (>5255men, 68>65 women);dyslipidemia;hypertension     Objective:     Vitals: BP (!) 172/76 (BP Location: Left Arm, Patient Position: Sitting)   Pulse 62   Temp 98.7 F (37.1 C) (Oral)   Resp 16   Ht 5' (1.524 m)   Wt 119 lb 6.4 oz (54.2 kg)   BMI 23.32 kg/m   Body mass index is 23.32 kg/m.  Advanced Directives 04/15/2018 10/03/2017 09/30/2017 06/17/2017 04/09/2017  Does Patient Have a Medical Advance Directive? No No No No No;Yes  Would patient like information on creating a medical advance directive? No - Patient declined No - Patient declined No - Patient declined No - Patient declined -    Tobacco Social History   Tobacco Use  Smoking Status Never Smoker  Smokeless Tobacco Never Used     Counseling given: Not Answered   Clinical Intake:  Pre-visit preparation completed: Yes  Pain : No/denies pain     Nutritional Status: BMI of 19-24  Normal Nutritional Risks: None Diabetes: No  How often do you need to have someone help you when you read instructions, pamphlets, or other written materials from your doctor or pharmacy?: 1 - Never What is the last grade level you completed in school?: 11th grade  Interpreter Needed?: No  Information entered by :: Tiffany Hill,LPN  Past Medical History:  Diagnosis Date  . Allergy   . Arthritis   . Asthma   . Back injury   . Depression   . Heart disease   . History of blood clots   . History of chicken pox   . Hyperlipidemia   . Hypertension   . Memory loss   . Migraine headache   . Myocardial infarction (HCC)   . Seizures (HCC)    Past Surgical History:  Procedure Laterality Date  . ABDOMINAL HYSTERECTOMY    . APPENDECTOMY    . BOWEL RESECTION    . COLONOSCOPY WITH PROPOFOL N/A 06/17/2017   Procedure: COLONOSCOPY WITH PROPOFOL;   Surgeon: Christena DeemSkulskie, Martin U, MD;  [facility];  Service: Endoscopy;  Laterality: N/A;   Family History  Problem Relation Age of Onset  . Cancer Father        unknown type   Social History   Socioeconomic History  . Marital status: Widowed    Spouse name: Not on file  . Number of children: Not on file  . Years of education: Not on file  . Highest education level: Not on file  Occupational History  . Not on file  Social Needs  . Financial resource strain: Not hard at all  . Food insecurity:    Worry: Never true    Inability: Never true  . Transportation needs:    Medical: No    Non-medical: No  Tobacco Use  . Smoking status: Never Smoker  . Smokeless tobacco: Never Used  Substance and Sexual Activity  . Alcohol use: No    Alcohol/week: 0.0 oz  . Drug use: No  . Sexual activity: Not on file  Lifestyle  . Physical activity:    Days per week: 0 days    Minutes per session: 0 min  . Stress: Not at all  Relationships  . Social connections:    Talks on phone: More than three times a week    Gets  together: Twice a week    Attends religious service: More than 4 times per year    Active member of club or organization: No    Attends meetings of clubs or organizations: Never    Relationship status: Widowed  Other Topics Concern  . Not on file  Social History Narrative  . Not on file    Outpatient Encounter Medications as of 04/15/2018  Medication Sig  . albuterol (PROVENTIL HFA) 108 (90 BASE) MCG/ACT inhaler Inhale 2 puffs into the lungs every 6 (six) hours as needed for wheezing or shortness of breath.  Marland Kitchen aspirin EC 81 MG tablet Take 1 tablet (81 mg total) by mouth daily.  Marland Kitchen docusate sodium (COLACE) 100 MG capsule Take 1 capsule (100 mg total) by mouth 2 (two) times daily.  . fluticasone (FLONASE) 50 MCG/ACT nasal spray Place 2 sprays into both nostrils daily. Use for 4-6 weeks then stop and use seasonally or as needed.  . loratadine (CLARITIN) 10 MG tablet  Take 1 tablet (10 mg total) by mouth daily. Use for 4-6 weeks then stop, and use as needed or seasonally  . losartan (COZAAR) 50 MG tablet Take 1 tablet (50 mg total) by mouth daily.  Marland Kitchen lubiprostone (AMITIZA) 24 MCG capsule Take 24 mcg by mouth 2 (two) times daily with a meal.  . meloxicam (MOBIC) 15 MG tablet Take 15 mg by mouth daily.  . montelukast (SINGULAIR) 10 MG tablet Take 1 tablet (10 mg total) by mouth at bedtime.  . polyethylene glycol powder (GLYCOLAX/MIRALAX) powder Take 17 g by mouth 2 (two) times daily as needed for mild constipation or moderate constipation.  . senna (SENOKOT) 8.6 MG TABS tablet Take 1 tablet (8.6 mg total) by mouth daily.  . brompheniramine-pseudoephedrine-DM 30-2-10 MG/5ML syrup Take 5 mLs by mouth 4 (four) times daily as needed. (Patient not taking: Reported on 04/15/2018)  . predniSONE (DELTASONE) 50 MG tablet Take 1 tablet (50 mg total) by mouth daily with breakfast. (Patient not taking: Reported on 04/15/2018)  . rosuvastatin (CRESTOR) 20 MG tablet Take 20 mg by mouth daily.   No facility-administered encounter medications on file as of 04/15/2018.     Activities of Daily Living In your present state of health, do you have any difficulty performing the following activities: 04/15/2018  Hearing? N  Vision? N  Difficulty concentrating or making decisions? N  Walking or climbing stairs? N  Dressing or bathing? N  Doing errands, shopping? N  Preparing Food and eating ? N  Using the Toilet? N  In the past six months, have you accidently leaked urine? N  Do you have problems with loss of bowel control? N  Managing your Medications? N  Managing your Finances? N  Housekeeping or managing your Housekeeping? N  Some recent data might be hidden    Patient Care Team: Smitty Cords, DO as PCP - General (Family Medicine)    Assessment:   This is a routine wellness examination for [facility].  Exercise Activities and Dietary recommendations Current  Exercise Habits: The patient does not participate in regular exercise at present, Exercise limited by: None identified  Goals    . DIET - INCREASE WATER INTAKE     Recommend drinking at least 6-8 glasses of water a day        Fall Risk Fall Risk  04/15/2018 01/07/2018 12/10/2017 04/09/2017 12/26/2016  Falls in the past year? No No No No No   Is the patient's home free of loose throw rugs  in walkways, pet beds, electrical cords, etc?   yes      Grab bars in the bathroom? yes      Handrails on the stairs?   no stairs       Adequate lighting?   yes  Timed Get Up and Go performed: Completed in 8 seconds with no use of assistive devices, steady gait. No intervention needed at this time.   Depression Screen PHQ 2/9 Scores 04/15/2018 01/07/2018 12/10/2017 10/03/2017  PHQ - 2 Score 0 0 0 0     Cognitive Function     6CIT Screen 04/15/2018 04/09/2017  What Year? 0 points 0 points  What month? 0 points 0 points  What time? 0 points 0 points  Count back from 20 0 points 0 points  Months in reverse 0 points 4 points  Repeat phrase 0 points 6 points  Total Score 0 10    Immunization History  Administered Date(s) Administered  . Influenza, High Dose Seasonal PF 09/20/2015  . Influenza-Unspecified 08/23/2017, 12/10/2017  . Pneumococcal Conjugate-13 08/16/2014  . Pneumococcal Polysaccharide-23 02/10/2014  . Tdap 02/10/2014    Qualifies for Shingles Vaccine? Yes, discussed shingrix vaccine   Screening Tests Health Maintenance  Topic Date Due  . MAMMOGRAM  04/16/2019 (Originally 09/19/2012)  . INFLUENZA VACCINE  06/05/2018  . TETANUS/TDAP  09/05/2024  . COLONOSCOPY  06/18/2027  . DEXA SCAN  Completed  . Hepatitis C Screening  Completed  . PNA vac Low Risk Adult  Completed    Cancer Screenings: Lung: Low Dose CT Chest recommended if Age 25-80 years, 30 pack-year currently smoking OR have quit w/in 15years. Patient does not qualify. Breast:  Up to date on Mammogram? No   Up to date of  Bone Density/Dexa? Yes Colorectal: completed 06/17/2017  Additional Screenings:  Hepatitis C Screening: completed 09/20/2015     Plan:    I have personally reviewed and addressed the Medicare Annual Wellness questionnaire and have noted the following in the patient's chart:  A. Medical and social history B. Use of alcohol, tobacco or illicit drugs  C. Current medications and supplements D. Functional ability and status E.  Nutritional status F.  Physical activity G. Advance directives H. List of other physicians I.  Hospitalizations, surgeries, and ER visits in previous 12 months J.  Vitals K. Screenings such as hearing and vision if needed, cognitive and depression L. Referrals and appointments   In addition, I have reviewed and discussed with patient certain preventive protocols, quality metrics, and best practice recommendations. A written personalized care plan for preventive services as well as general preventive health recommendations were provided to patient.   Signed,  Marin Roberts, LPN Nurse Health Advisor   Nurse Notes:none

## 2018-04-15 NOTE — Patient Instructions (Addendum)
[name] , Thank you for taking time to come for your Medicare Wellness Visit. I appreciate your ongoing commitment to your health goals. Please review the following plan we discussed and let me know if I can assist you in the future.   Screening recommendations/referrals: Colonoscopy:completed 06/17/2017 Mammogram: due now- declined Bone Density: completed 07/06/2014 Recommended yearly ophthalmology/optometry visit for glaucoma screening and checkup Recommended yearly dental visit for hygiene and checkup  Vaccinations: Influenza vaccine: due 07/2018 Pneumococcal vaccine: up to date Tdap vaccine: up to date Shingles vaccine: shingrix eligible, check with your insurance company for coverage   Advanced directives: Advance directive discussed with you today. Even though you declined this today please call our office should you change your mind and we can give you the proper paperwork for you to fill out.  Conditions/risks identified: Recommend drinking at least 6-8 glasses of water a day   Next appointment: Follow up in one year for your annual wellness exam.   Preventive Care 65 Years and Older, Female Preventive care refers to lifestyle choices and visits with your health care provider that can promote health and wellness. What does preventive care include?  A yearly physical exam. This is also called an annual well check.  Dental exams once or twice a year.  Routine eye exams. Ask your health care provider how often you should have your eyes checked.  Personal lifestyle choices, including:  Daily care of your teeth and gums.  Regular physical activity.  Eating a healthy diet.  Avoiding tobacco and drug use.  Limiting alcohol use.  Practicing safe sex.  Taking low-dose aspirin every day.  Taking vitamin and mineral supplements as recommended by your health care provider. What happens during an annual well check? The services and screenings done by your health care  provider during your annual well check will depend on your age, overall health, lifestyle risk factors, and family history of disease. Counseling  Your health care provider may ask you questions about your:  Alcohol use.  Tobacco use.  Drug use.  Emotional well-being.  Home and relationship well-being.  Sexual activity.  Eating habits.  History of falls.  Memory and ability to understand (cognition).  Work and work Astronomerenvironment.  Reproductive health. Screening  You may have the following tests or measurements:  Height, weight, and BMI.  Blood pressure.  Lipid and cholesterol levels. These may be checked every 5 years, or more frequently if you are over 799 years old.  Skin check.  Lung cancer screening. You may have this screening every year starting at age 70 if you have a 30-pack-year history of smoking and currently smoke or have quit within the past 15 years.  Fecal occult blood test (FOBT) of the stool. You may have this test every year starting at age 70.  Flexible sigmoidoscopy or colonoscopy. You may have a sigmoidoscopy every 5 years or a colonoscopy every 10 years starting at age 70.  Hepatitis C blood test.  Hepatitis B blood test.  Sexually transmitted disease (STD) testing.  Diabetes screening. This is done by checking your blood sugar (glucose) after you have not eaten for a while (fasting). You may have this done every 1-3 years.  Bone density scan. This is done to screen for osteoporosis. You may have this done starting at age 70.  Mammogram. This may be done every 1-2 years. Talk to your health care provider about how often you should have regular mammograms. Talk with your health care provider about your  test results, treatment options, and if necessary, the need for more tests. Vaccines  Your health care provider may recommend certain vaccines, such as:  Influenza vaccine. This is recommended every year.  Tetanus, diphtheria, and acellular  pertussis (Tdap, Td) vaccine. You may need a Td booster every 10 years.  Zoster vaccine. You may need this after age 27.  Pneumococcal 13-valent conjugate (PCV13) vaccine. One dose is recommended after age 37.  Pneumococcal polysaccharide (PPSV23) vaccine. One dose is recommended after age 23. Talk to your health care provider about which screenings and vaccines you need and how often you need them. This information is not intended to replace advice given to you by your health care provider. Make sure you discuss any questions you have with your health care provider. Document Released: 11/18/2015 Document Revised: 07/11/2016 Document Reviewed: 08/23/2015 Elsevier Interactive Patient Education  2017 [place] Prevention in the Home Falls can cause injuries. They can happen to people of all ages. There are many things you can do to make your home safe and to help prevent falls. What can I do on the outside of my home?  Regularly fix the edges of walkways and driveways and fix any cracks.  Remove anything that might make you trip as you walk through a door, such as a raised step or threshold.  Trim any bushes or trees on the path to your home.  Use bright outdoor lighting.  Clear any walking paths of anything that might make someone trip, such as rocks or tools.  Regularly check to see if handrails are loose or broken. Make sure that both sides of any steps have handrails.  Any raised decks and porches should have guardrails on the edges.  Have any leaves, snow, or ice cleared regularly.  Use sand or salt on walking paths during winter.  Clean up any spills in your garage right away. This includes oil or grease spills. What can I do in the bathroom?  Use night lights.  Install grab bars by the toilet and in the tub and shower. Do not use towel bars as grab bars.  Use non-skid mats or decals in the tub or shower.  If you need to sit down in the shower, use a plastic,  non-slip stool.  Keep the floor dry. Clean up any water that spills on the floor as soon as it happens.  Remove soap buildup in the tub or shower regularly.  Attach bath mats securely with double-sided non-slip rug tape.  Do not have throw rugs and other things on the floor that can make you trip. What can I do in the bedroom?  Use night lights.  Make sure that you have a light by your bed that is easy to reach.  Do not use any sheets or blankets that are too big for your bed. They should not hang down onto the floor.  Have a firm chair that has side arms. You can use this for support while you get dressed.  Do not have throw rugs and other things on the floor that can make you trip. What can I do in the kitchen?  Clean up any spills right away.  Avoid walking on wet floors.  Keep items that you use a lot in easy-to-reach places.  If you need to reach something above you, use a strong step stool that has a grab bar.  Keep electrical cords out of the way.  Do not use floor polish or wax that  makes floors slippery. If you must use wax, use non-skid floor wax.  Do not have throw rugs and other things on the floor that can make you trip. What can I do with my stairs?  Do not leave any items on the stairs.  Make sure that there are handrails on both sides of the stairs and use them. Fix handrails that are broken or loose. Make sure that handrails are as long as the stairways.  Check any carpeting to make sure that it is firmly attached to the stairs. Fix any carpet that is loose or worn.  Avoid having throw rugs at the top or bottom of the stairs. If you do have throw rugs, attach them to the floor with carpet tape.  Make sure that you have a light switch at the top of the stairs and the bottom of the stairs. If you do not have them, ask someone to add them for you. What else can I do to help prevent falls?  Wear shoes that:  Do not have high heels.  Have rubber  bottoms.  Are comfortable and fit you well.  Are closed at the toe. Do not wear sandals.  If you use a stepladder:  Make sure that it is fully opened. Do not climb a closed stepladder.  Make sure that both sides of the stepladder are locked into place.  Ask someone to hold it for you, if possible.  Clearly mark and make sure that you can see:  Any grab bars or handrails.  First and last steps.  Where the edge of each step is.  Use tools that help you move around (mobility aids) if they are needed. These include:  Canes.  Walkers.  Scooters.  Crutches.  Turn on the lights when you go into a dark area. Replace any light bulbs as soon as they burn out.  Set up your furniture so you have a clear path. Avoid moving your furniture around.  If any of your floors are uneven, fix them.  If there are any pets around you, be aware of where they are.  Review your medicines with your doctor. Some medicines can make you feel dizzy. This can increase your chance of falling. Ask your doctor what other things that you can do to help prevent falls. This information is not intended to replace advice given to you by your health care provider. Make sure you discuss any questions you have with your health care provider. Document Released: 08/18/2009 Document Revised: 03/29/2016 Document Reviewed: 11/26/2014 Elsevier Interactive Patient Education  2017 [employer].

## 2018-05-09 ENCOUNTER — Ambulatory Visit (INDEPENDENT_AMBULATORY_CARE_PROVIDER_SITE_OTHER): Payer: Medicare Other | Admitting: Family Medicine

## 2018-05-09 ENCOUNTER — Encounter: Payer: Self-pay | Admitting: Family Medicine

## 2018-05-09 VITALS — BP 158/98 | HR 61 | Temp 100.9°F | Resp 16 | Ht 60.0 in | Wt 121.0 lb

## 2018-05-09 DIAGNOSIS — N952 Postmenopausal atrophic vaginitis: Secondary | ICD-10-CM | POA: Diagnosis not present

## 2018-05-09 DIAGNOSIS — G2581 Restless legs syndrome: Secondary | ICD-10-CM

## 2018-05-09 DIAGNOSIS — J3089 Other allergic rhinitis: Secondary | ICD-10-CM | POA: Diagnosis not present

## 2018-05-09 DIAGNOSIS — I10 Essential (primary) hypertension: Secondary | ICD-10-CM | POA: Diagnosis not present

## 2018-05-09 DIAGNOSIS — J019 Acute sinusitis, unspecified: Secondary | ICD-10-CM | POA: Diagnosis not present

## 2018-05-09 DIAGNOSIS — J4520 Mild intermittent asthma, uncomplicated: Secondary | ICD-10-CM

## 2018-05-09 DIAGNOSIS — G894 Chronic pain syndrome: Secondary | ICD-10-CM | POA: Diagnosis not present

## 2018-05-09 MED ORDER — LOSARTAN POTASSIUM 100 MG PO TABS: 100.0000 mg | ORAL_TABLET | Freq: Every day | ORAL | 3 refills | Status: DC

## 2018-05-09 MED ORDER — MONTELUKAST SODIUM 10 MG PO TABS: 10.0000 mg | ORAL_TABLET | Freq: Every day | ORAL | 3 refills | Status: DC

## 2018-05-09 MED ORDER — FLUTICASONE PROPIONATE 50 MCG/ACT NA SUSP: 2.0000 | Freq: Every day | NASAL | 3 refills | Status: DC

## 2018-05-09 MED ORDER — FEXOFENADINE HCL 180 MG PO TABS: 180.0000 mg | ORAL_TABLET | Freq: Every day | ORAL | 3 refills | Status: DC

## 2018-05-09 MED ORDER — AMOXICILLIN 500 MG PO CAPS: 500.0000 mg | ORAL_CAPSULE | Freq: Two times a day (BID) | ORAL | 0 refills | Status: DC

## 2018-05-09 MED ORDER — GABAPENTIN 100 MG PO CAPS: ORAL_CAPSULE | ORAL | 1 refills | Status: DC

## 2018-05-09 MED ORDER — ESTROGENS, CONJUGATED 0.625 MG/GM VA CREA: 1.0000 | TOPICAL_CREAM | Freq: Every day | VAGINAL | 3 refills | Status: DC

## 2018-05-09 NOTE — Patient Instructions (Addendum)
Thank you for coming to the office today.  Elevated blood pressure - increase Losartan from 50mg  one pill up to 2 pills at once daily (100mg ) - when you run out of old pill 50mg  - fill the new rx to get Losartan 100mg  once daily for BP  For vaginal dryness - start Premarin topical estrogen cream  For sinuses - start Amoxicillin, Flonase nasal spray, Singulair nightly, and switch from Claritin to [employer]legra  Start Gabapentin 100mg  capsules, take at night for about 3 nights only, and then increase to 2 pills at night, and eventually up to max dose of 3 pills at night   DUE for FASTING BLOOD WORK (no food or drink after midnight before the lab appointment, only water or coffee without cream/sugar on the morning of)  SCHEDULE "Lab Only" visit in the morning at the clinic for lab draw in 3 MONTHS   - Make sure Lab Only appointment is at about 1 week before your next appointment, so that results will be available  For Lab Results, once available within 2-3 days of blood draw, you can can log in to MyChart online to view your results and a brief explanation. Also, we can discuss results at next follow-up visit.   Please schedule a Follow-up Appointment to: Return in about 3 months (around 08/09/2018) for Annual Physical.  If you have any other questions or concerns, please feel free to call the office or send a message through MyChart. You may also schedule an earlier appointment if necessary.  Additionally, you may be receiving a survey about your experience at our office within a few days to 1 week by e-mail or mail. We value your feedback.  Saralyn PilarAlexander Karamalegos, DO [facility], [place]

## 2018-05-09 NOTE — Progress Notes (Signed)
Subjective:    Patient ID: Kirsten [name], female    DOB: 04-14-1946, 72 y.o.   MRN: [id number]  Kirsten [name] is a 72 y.o. female presenting on 05/09/2018 for Hypertension   HPI   CHRONIC HTN: Reports that she has not been keeping track of BP regularly. Concerned with elevated BP readings. Current Meds - Losartan 50mg  - Prior meds ACEi cough and allergy eye swelling on amlodipine Reports good compliance, took meds today. Tolerating well, w/o complaints. Denies CP, dyspnea, HA, edema, dizziness / lightheadedness  SINUSITIS Reports symptoms of persistent sinus pressure and congestion and drainage and ear discomfort over past 1-2 months, uncertain duration, she had had occasional low grade temp or fever without chills. Measured elevated temp today see vitals. - She thinks need different allergy pill - loratadine is not as effective - No longer taking SIngulair, needs refill - She has history of asthma, has been using albuterol and nebulizer at home as needed with relief - Denies fevers/chills, sweats, chest pain or tightness, nausea vomiting, muscle aches, body aches, hemoptysis  Vaginal Atrophy, Post menopausal Previously treated with hormone therapy in past, requesting to restart this, complains of vaginal dryness and atrophy. Has not tried topical premarin  History of Lower Extremity Pain / Restless Leg Syndrome Reports this keeps her awake at night, feeling of some aches in back and legs worse when laying down at night, has sensation of restless leg needs to move.   Depression screen [facility] 2/9 05/09/2018 04/15/2018 01/07/2018  Decreased Interest 0 0 0  Down, Depressed, Hopeless 0 0 0  PHQ - 2 Score 0 0 0    Social History   Tobacco Use  . Smoking status: Never Smoker  . Smokeless tobacco: Never Used  Substance Use Topics  . Alcohol use: No    Alcohol/week: 0.0 oz  . Drug use: No    Review of Systems Per HPI unless specifically indicated above     Objective:    BP  (!) 158/98 Comment: manual  Pulse 61   Temp (!) 100.9 F (38.3 C) (Oral)   Resp 16   Ht 5' (1.524 m)   Wt 121 lb (54.9 kg)   BMI 23.63 kg/m   Wt Readings from Last 3 Encounters:  05/09/18 121 lb (54.9 kg)  04/15/18 119 lb 6.4 oz (54.2 kg)  01/07/18 116 lb (52.6 kg)    Physical Exam  Constitutional: She is oriented to person, place, and time. She appears well-developed and well-nourished. No distress.  Well-appearing, comfortable, cooperative  HENT:  Head: Normocephalic and atraumatic.  Mouth/Throat: Oropharynx is clear and moist.  Frontal sinuses mild -tender. Nares patent without purulence or edema. Bilateral TMs clear without erythema, effusion or bulging. Oropharynx clear without erythema, exudates, edema or asymmetry.  Eyes: Conjunctivae are normal. Right eye exhibits no discharge. Left eye exhibits no discharge.  Neck: Normal range of motion. Neck supple. No thyromegaly present.  Cardiovascular: Normal rate, regular rhythm, normal heart sounds and intact distal pulses.  No murmur heard. Pulmonary/Chest: Effort normal and breath sounds normal. No respiratory distress. She has no wheezes. She has no rales.  Musculoskeletal: Normal range of motion. She exhibits no edema.  Lymphadenopathy:    She has no cervical adenopathy.  Neurological: She is alert and oriented to person, place, and time.  Skin: Skin is warm and dry. No rash noted. She is not diaphoretic. No erythema.  Psychiatric: She has a normal mood and affect. Her behavior is normal.  Well  groomed, good eye contact, normal speech and thoughts  Nursing note and vitals reviewed.  Results for orders placed or performed during the hospital encounter of 07/17/17  Lipase, blood  Result Value Ref Range   Lipase 29 11 - 51 U/L  Comprehensive metabolic panel  Result Value Ref Range   Sodium 140 135 - 145 mmol/L   Potassium 3.3 (L) 3.5 - 5.1 mmol/L   Chloride 108 101 - 111 mmol/L   CO2 23 22 - 32 mmol/L   Glucose, Bld 100  (H) 65 - 99 mg/dL   BUN 8 6 - 20 mg/dL   Creatinine, Ser 1.610.82 0.44 - 1.00 mg/dL   Calcium 9.1 8.9 - 09.610.3 mg/dL   Total Protein 6.6 6.5 - 8.1 g/dL   Albumin 3.6 3.5 - 5.0 g/dL   AST 46 (H) 15 - 41 U/L   ALT 41 14 - 54 U/L   Alkaline Phosphatase 64 38 - 126 U/L   Total Bilirubin 0.5 0.3 - 1.2 mg/dL   GFR calc non Af Amer >60 >60 mL/min   GFR calc Af Amer >60 >60 mL/min   Anion gap 9 5 - 15  CBC  Result Value Ref Range   WBC 9.3 3.6 - 11.0 K/uL   RBC 4.53 3.80 - 5.20 MIL/uL   Hemoglobin 12.0 12.0 - 16.0 g/dL   HCT 04.535.9 40.935.0 - 81.147.0 %   MCV 79.4 (L) 80.0 - 100.0 fL   MCH 26.6 26.0 - 34.0 pg   MCHC 33.5 32.0 - 36.0 g/dL   RDW 91.415.5 (H) 78.211.5 - 95.614.5 %   Platelets 288 150 - 440 K/uL  Urinalysis, Complete w Microscopic  Result Value Ref Range   Color, Urine COLORLESS (A) YELLOW   APPearance CLEAR (A) CLEAR   Specific Gravity, Urine 1.002 (L) 1.005 - 1.030   pH 6.0 5.0 - 8.0   Glucose, UA NEGATIVE NEGATIVE mg/dL   Hgb urine dipstick SMALL (A) NEGATIVE   Bilirubin Urine NEGATIVE NEGATIVE   Ketones, ur NEGATIVE NEGATIVE mg/dL   Protein, ur NEGATIVE NEGATIVE mg/dL   Nitrite NEGATIVE NEGATIVE   Leukocytes, UA NEGATIVE NEGATIVE   RBC / HPF NONE SEEN 0 - 5 RBC/hpf   WBC, UA 0-5 0 - 5 WBC/hpf   Bacteria, UA NONE SEEN NONE SEEN   Squamous Epithelial / LPF NONE SEEN NONE SEEN      Assessment & Plan:   Problem List Items Addressed This Visit    Allergic rhinitis due to allergen   Relevant Medications   montelukast (SINGULAIR) 10 MG tablet   fexofenadine (ALLEGRA) 180 MG tablet   fluticasone (FLONASE) 50 MCG/ACT nasal spray   Asthma without status asthmaticus Refill Singulair given persistent allergy asthma, still needed    Relevant Medications   montelukast (SINGULAIR) 10 MG tablet   Chronic pain   Relevant Medications   gabapentin (NEURONTIN) 100 MG capsule   Hypertension - Primary Mildly elevated initial BP, repeat manual check improved. Question med adherence - Home BP  readings none  No known complications  Plan:  1. Increase ARB to Losartan 100mg  daily from 50 2. Encourage improved lifestyle - low sodium diet, regular exercise 3. Start monitor BP outside office, bring readings to next visit, if persistently >140/90 or new symptoms notify office sooner 4. Follow-up 3 months    Relevant Medications   losartan (COZAAR) 100 MG tablet   Restless legs Most consistent with RLS and some chronic joint pain Trial on Gabapentin for night-time symptoms, titrate  up as advised   Relevant Medications   gabapentin (NEURONTIN) 100 MG capsule    Other Visit Diagnoses    Subacute rhinosinusitis      Consistent with frontal subacute rhinosinusitis, likely initially viral URI vs allergic rhinitis component with worsening concern for bacterial infection duration 1-2 months .  Plan: 1. Start Amoxicillin for sinusitis 2. Switch anti histamine less drowsy allegra rx and restart Flonase 2 sprays in each nostril daily for next 4-6 weeks, then may stop and use seasonally or as needed 3. Supportive care with nasal saline OTC, hydration 4. Return criteria reviewed     Relevant Medications   fexofenadine (ALLEGRA) 180 MG tablet   fluticasone (FLONASE) 50 MCG/ACT nasal spray   amoxicillin (AMOXIL) 500 MG capsule   Post-menopausal atrophic vaginitis       Relevant Medications   conjugated estrogens (PREMARIN) vaginal cream      Meds ordered this encounter  Medications  . conjugated estrogens (PREMARIN) vaginal cream    Sig: Place 1 Applicatorful vaginally daily.    Dispense:  90 g    Refill:  3  . montelukast (SINGULAIR) 10 MG tablet    Sig: Take 1 tablet (10 mg total) by mouth at bedtime.    Dispense:  90 tablet    Refill:  3  . fexofenadine (ALLEGRA) 180 MG tablet    Sig: Take 1 tablet (180 mg total) by mouth daily.    Dispense:  90 tablet    Refill:  3  . fluticasone (FLONASE) 50 MCG/ACT nasal spray    Sig: Place 2 sprays into both nostrils daily. Use for  4-6 weeks then stop and use seasonally or as needed.    Dispense:  48 g    Refill:  3  . amoxicillin (AMOXIL) 500 MG capsule    Sig: Take 1 capsule (500 mg total) by mouth 2 (two) times daily. For 10 days    Dispense:  20 capsule    Refill:  0  . gabapentin (NEURONTIN) 100 MG capsule    Sig: Start 1 capsule at bedtime, increase by 1 cap every 3 days as tolerated up to max dose 3 pills at night.    Dispense:  90 capsule    Refill:  1  . losartan (COZAAR) 100 MG tablet    Sig: Take 1 tablet (100 mg total) by mouth daily.    Dispense:  90 tablet    Refill:  3    Follow up plan: Return in about 3 months (around 08/09/2018) for Annual Physical.  Future labs ordered for 08/05/18  - order labs annual physical   Saralyn Pilar, DO [facility] 05/10/2018, 11:41 AM

## 2018-05-10 ENCOUNTER — Other Ambulatory Visit: Payer: Self-pay | Admitting: Family Medicine

## 2018-05-10 DIAGNOSIS — E782 Mixed hyperlipidemia: Secondary | ICD-10-CM

## 2018-05-10 DIAGNOSIS — I10 Essential (primary) hypertension: Secondary | ICD-10-CM

## 2018-05-10 DIAGNOSIS — Z0000 Encounter for general adult medical examination without abnormal findings: Secondary | ICD-10-CM

## 2018-05-10 DIAGNOSIS — R799 Abnormal finding of blood chemistry, unspecified: Secondary | ICD-10-CM

## 2018-05-10 DIAGNOSIS — R7309 Other abnormal glucose: Secondary | ICD-10-CM

## 2018-05-10 DIAGNOSIS — R413 Other amnesia: Secondary | ICD-10-CM

## 2018-05-12 ENCOUNTER — Other Ambulatory Visit: Payer: Self-pay | Admitting: Family Medicine

## 2018-05-12 DIAGNOSIS — Z1231 Encounter for screening mammogram for malignant neoplasm of breast: Secondary | ICD-10-CM

## 2018-05-19 DIAGNOSIS — J45909 Unspecified asthma, uncomplicated: Secondary | ICD-10-CM | POA: Diagnosis not present

## 2018-06-11 IMAGING — DX DG CHEST 2V
2 series · 2 of 2 positions shown · non-contrast
Comparison: 09/30/2017

CLINICAL DATA: Chronic cough

EXAM:
CHEST  2 VIEW

[chest pa]
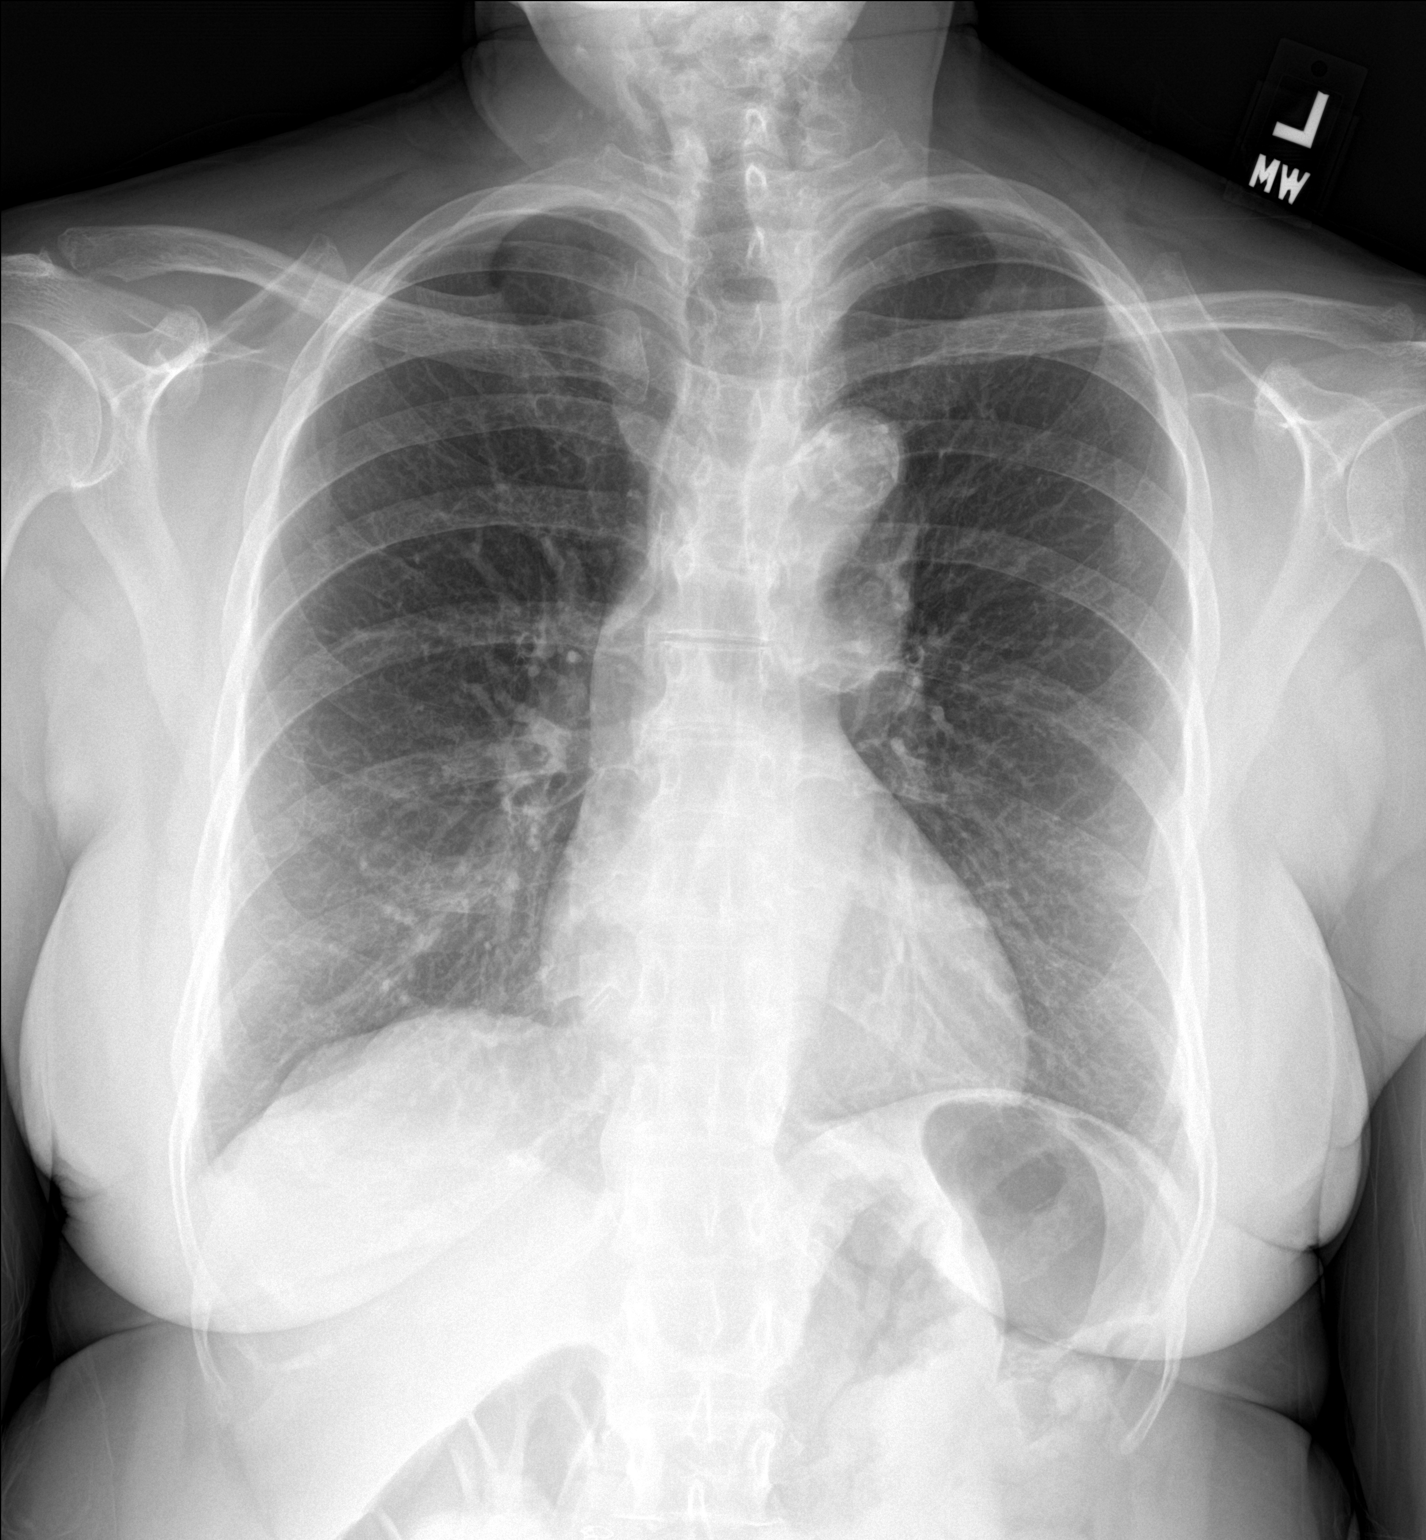

[chest lat]
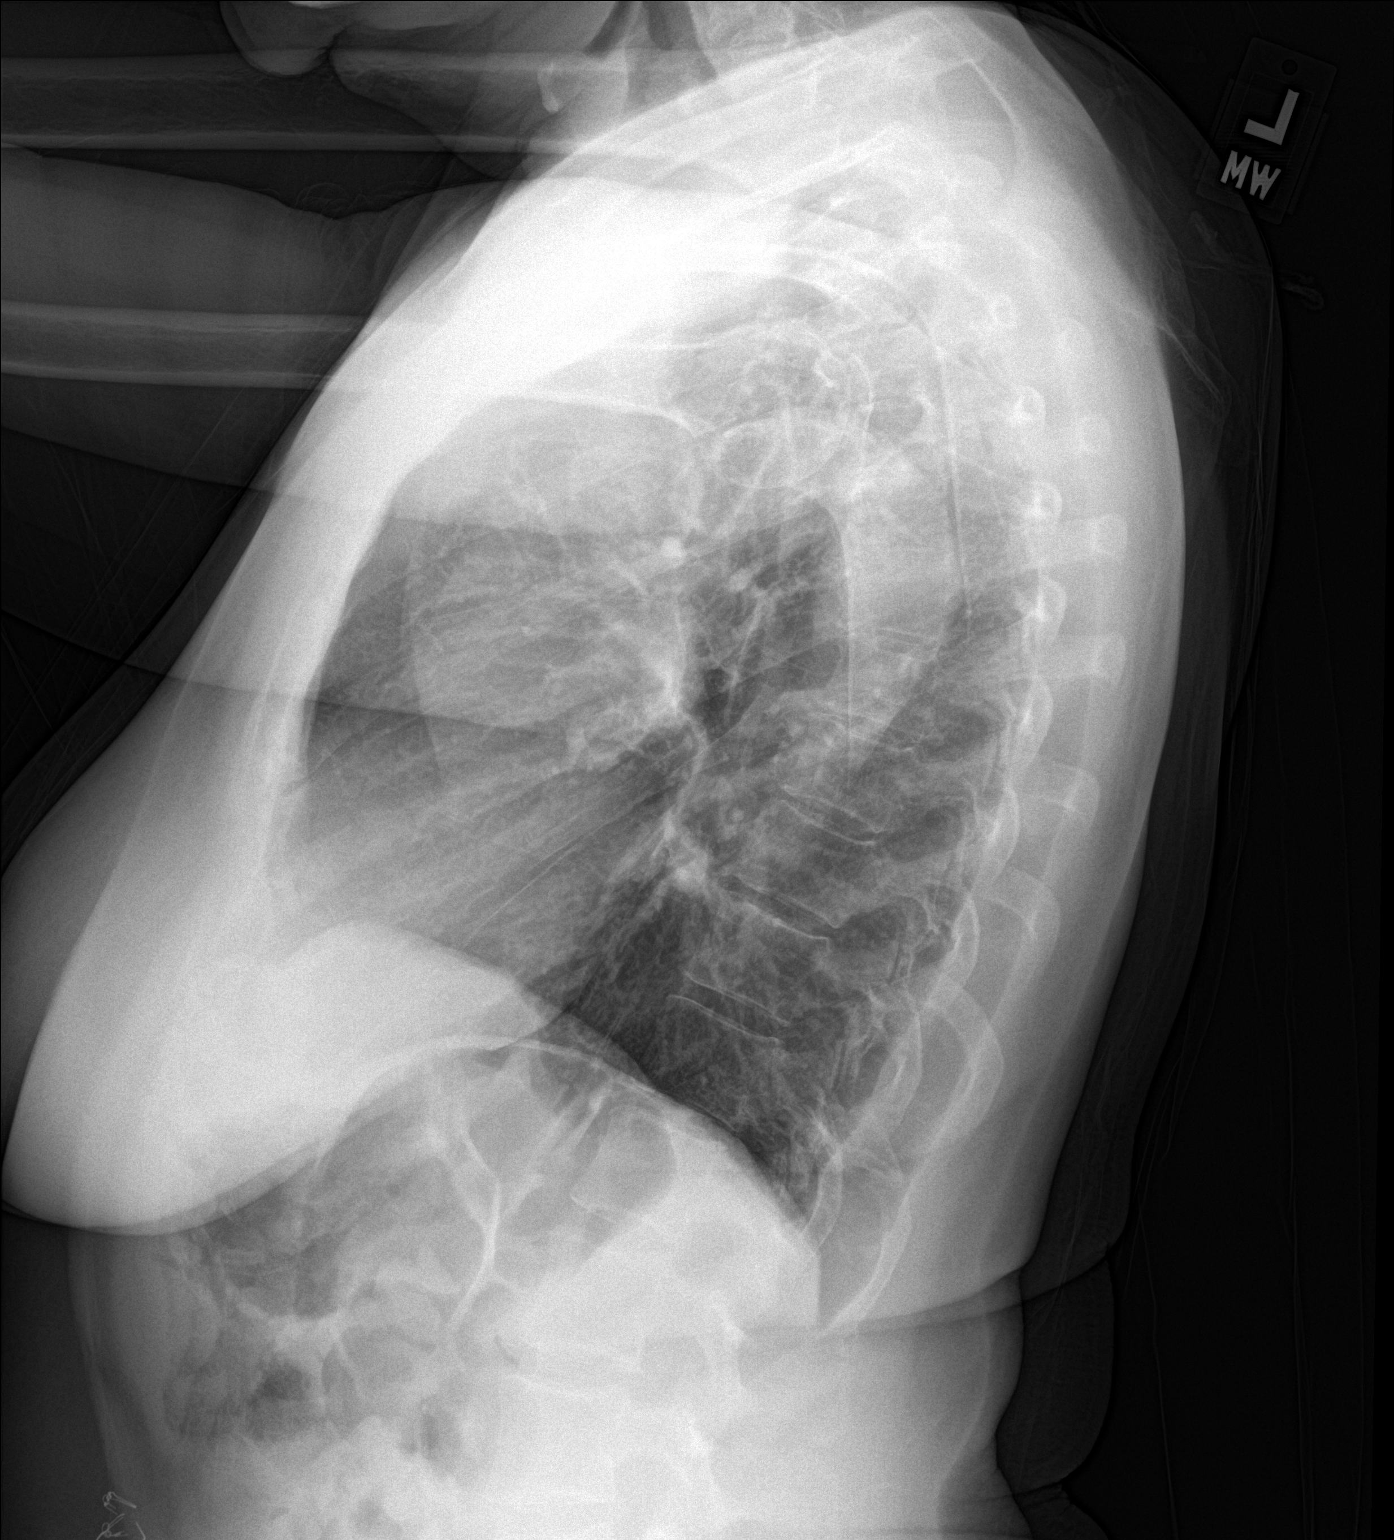

[2 of 2 positions shown; findings below may reference images not displayed]

FINDINGS: Aortic calcifications. Heart and mediastinal contours are within
normal limits. No focal opacities or effusions. No acute bony
abnormality.
IMPRESSION: No active cardiopulmonary disease.

## 2018-06-13 DIAGNOSIS — J45909 Unspecified asthma, uncomplicated: Secondary | ICD-10-CM | POA: Diagnosis not present

## 2018-07-14 ENCOUNTER — Encounter: Payer: Self-pay | Admitting: Family Medicine

## 2018-07-14 ENCOUNTER — Ambulatory Visit (INDEPENDENT_AMBULATORY_CARE_PROVIDER_SITE_OTHER): Payer: Medicare Other | Admitting: Family Medicine

## 2018-07-14 VITALS — BP 178/108 | HR 55 | Temp 97.9°F | Resp 16 | Ht 60.0 in | Wt 116.4 lb

## 2018-07-14 DIAGNOSIS — L239 Allergic contact dermatitis, unspecified cause: Secondary | ICD-10-CM

## 2018-07-14 DIAGNOSIS — I10 Essential (primary) hypertension: Secondary | ICD-10-CM

## 2018-07-14 MED ORDER — TRIAMCINOLONE ACETONIDE 0.1 % EX CREA: 1.0000 "application " | TOPICAL_CREAM | Freq: Three times a day (TID) | CUTANEOUS | 0 refills | Status: DC | PRN

## 2018-07-14 NOTE — Assessment & Plan Note (Signed)
Still elevated and uncontrolled - off med now due to allergic response - Still concerns for medication non adherence, however she admits to compliance. - Home BP readings - None  No known complications Failed ACEi (Cough), Amlodipine (HA), ?allergy to Losartan 100mg  tab    Plan:  1. RESUME PRIOR treatment Losartan 50mg  x 2 tabs daily for 100mg  - may need to switch meds if still allergic response 2. Encourage improved lifestyle - low sodium diet, regular exercise 3. Start monitor BP outside office, bring readings to next visit, if persistently >140/90 or new symptoms notify office sooner - Referral to Home Health for change in medical status and medication management, benefit from RN monitoring and education, BP measurements 4. Follow up 1 month as scheduled

## 2018-07-14 NOTE — Patient Instructions (Addendum)
Thank you for coming to the office today.  Please schedule and return for a NURSE ONLY VISIT for VACCINE - Approximately around end of September or October 2019 - Need High Dose Flu Vaccine  RESTART lower dose Losartan 50mg  pills take TWO at once daily - call office if you need a refill.  We will order a Home Health Nurse to come out for BP monitoring and medication management  Please schedule a Follow-up Appointment to: Return if symptoms worsen or fail to improve, for keep apt in October.  If you have any other questions or concerns, please feel free to call the office or send a message through MyChart. You may also schedule an earlier appointment if necessary.  Additionally, you may be receiving a survey about your experience at our office within a few days to 1 week by e-mail or mail. We value your feedback.  Saralyn Pilar, DO [facility], [place]

## 2018-07-14 NOTE — Progress Notes (Signed)
Subjective:    Patient ID: Kirsten [name], female    DOB: 12-Aug-1946, 72 y.o.   MRN: [id number]  Kirsten [name] is a 72 y.o. female presenting on 07/14/2018 for Rash (as per patient may be allergic to B/P meds since dosage was changed -Losartan)   HPI   Allergic Reaction Reports multiple areas on arms and trunk with itching redness. Thinks this started when she filled Losartan 100mg , was a different pill, the lower dose 50mg  did not bother her in the past. She was taking two of the 50mg  for a while from last apt, until recently filled higher dose pill. No prior issues, no other known allergy or triggers for her symptoms. Not taken any medicines or used topical cream. Denies any throat swelling or other allergic response, cough, dyspnea, nausea vomiting  CHRONIC HTN: Reportsthat she has not been keeping track of BP regularly. Concerned with elevated BP readings. Current Meds -Losartan 100mg  daily (HOLDING now for past 1 day, and she will resume 50mg  x 2 pills) - Prior meds ACEi cough and allergy eye swelling on amlodipine - asking about home health nurse for monitoring BP Reports good compliance, took meds today. Tolerating well, w/o complaints. Denies CP, dyspnea, HA, edema, dizziness / lightheadedness  PMH - history of post concussive syndrome, has some chronic confusion that affects her function. It requires significant effort for her to leave the home and follow-up with appointments.  Health Maintenance: Due Flu Shot, will return for high dose vaccine.  Depression screen [facility] 2/9 07/14/2018 05/09/2018 04/15/2018  Decreased Interest 0 0 0  Down, Depressed, Hopeless 0 0 0  PHQ - 2 Score 0 0 0    Social History   Tobacco Use  . Smoking status: Never Smoker  . Smokeless tobacco: Never Used  Substance Use Topics  . Alcohol use: No    Alcohol/week: 0.0 standard drinks  . Drug use: No    Review of Systems Per HPI unless specifically indicated above     Objective:    BP (!)  178/108   Pulse (!) 55   Temp 97.9 F (36.6 C) (Oral)   Resp 16   Ht 5' (1.524 m)   Wt 116 lb 6.4 oz (52.8 kg)   BMI 22.73 kg/m   Wt Readings from Last 3 Encounters:  07/14/18 116 lb 6.4 oz (52.8 kg)  05/09/18 121 lb (54.9 kg)  04/15/18 119 lb 6.4 oz (54.2 kg)    Physical Exam  Constitutional: She is oriented to person, place, and time. She appears well-developed and well-nourished. No distress.  Well-appearing, comfortable, cooperative  HENT:  Head: Normocephalic and atraumatic.  Mouth/Throat: Oropharynx is clear and moist.  Eyes: Conjunctivae are normal. Right eye exhibits no discharge. Left eye exhibits no discharge.  Cardiovascular: Normal rate, regular rhythm, normal heart sounds and intact distal pulses.  No murmur heard. Pulmonary/Chest: Effort normal and breath sounds normal. No respiratory distress. She has no wheezes. She has no rales.  Musculoskeletal: Normal range of motion. She exhibits no edema.  Neurological: She is alert and oriented to person, place, and time.  Skin: Skin is warm and dry. Rash (few scattered localized erythematous distinct papular lesions, without complication) noted. She is not diaphoretic. No erythema.  Psychiatric: She has a normal mood and affect. Her behavior is normal.  Well groomed, good eye contact, normal speech and thoughts  Nursing note and vitals reviewed.  Results for orders placed or performed during the hospital encounter of 07/17/17  Lipase,  blood  Result Value Ref Range   Lipase 29 11 - 51 U/L  Comprehensive metabolic panel  Result Value Ref Range   Sodium 140 135 - 145 mmol/L   Potassium 3.3 (L) 3.5 - 5.1 mmol/L   Chloride 108 101 - 111 mmol/L   CO2 23 22 - 32 mmol/L   Glucose, Bld 100 (H) 65 - 99 mg/dL   BUN 8 6 - 20 mg/dL   Creatinine, Ser 2.01 0.44 - 1.00 mg/dL   Calcium 9.1 8.9 - 00.7 mg/dL   Total Protein 6.6 6.5 - 8.1 g/dL   Albumin 3.6 3.5 - 5.0 g/dL   AST 46 (H) 15 - 41 U/L   ALT 41 14 - 54 U/L   Alkaline  Phosphatase 64 38 - 126 U/L   Total Bilirubin 0.5 0.3 - 1.2 mg/dL   GFR calc non Af Amer >60 >60 mL/min   GFR calc Af Amer >60 >60 mL/min   Anion gap 9 5 - 15  CBC  Result Value Ref Range   WBC 9.3 3.6 - 11.0 K/uL   RBC 4.53 3.80 - 5.20 MIL/uL   Hemoglobin 12.0 12.0 - 16.0 g/dL   HCT 12.1 97.5 - 88.3 %   MCV 79.4 (L) 80.0 - 100.0 fL   MCH 26.6 26.0 - 34.0 pg   MCHC 33.5 32.0 - 36.0 g/dL   RDW 25.4 (H) 98.2 - 64.1 %   Platelets 288 150 - 440 K/uL  Urinalysis, Complete w Microscopic  Result Value Ref Range   Color, Urine COLORLESS (A) YELLOW   APPearance CLEAR (A) CLEAR   Specific Gravity, Urine 1.002 (L) 1.005 - 1.030   pH 6.0 5.0 - 8.0   Glucose, UA NEGATIVE NEGATIVE mg/dL   Hgb urine dipstick SMALL (A) NEGATIVE   Bilirubin Urine NEGATIVE NEGATIVE   Ketones, ur NEGATIVE NEGATIVE mg/dL   Protein, ur NEGATIVE NEGATIVE mg/dL   Nitrite NEGATIVE NEGATIVE   Leukocytes, UA NEGATIVE NEGATIVE   RBC / HPF NONE SEEN 0 - 5 RBC/hpf   WBC, UA 0-5 0 - 5 WBC/hpf   Bacteria, UA NONE SEEN NONE SEEN   Squamous Epithelial / LPF NONE SEEN NONE SEEN      Assessment & Plan:   Problem List Items Addressed This Visit    Hypertension - Primary    Still elevated and uncontrolled - off med now due to allergic response - Still concerns for medication non adherence, however she admits to compliance. - Home BP readings - None  No known complications Failed ACEi (Cough), Amlodipine (HA), ?allergy to Losartan 100mg  tab    Plan:  1. RESUME PRIOR treatment Losartan 50mg  x 2 tabs daily for 100mg  - may need to switch meds if still allergic response 2. Encourage improved lifestyle - low sodium diet, regular exercise 3. Start monitor BP outside office, bring readings to next visit, if persistently >140/90 or new symptoms notify office sooner - Referral to Home Health for change in medical status and medication management, benefit from RN monitoring and education, BP measurements 4. Follow up 1 month as  scheduled      Relevant Medications   losartan (COZAAR) 50 MG tablet   Other Relevant Orders   Ambulatory referral to Home Health    Other Visit Diagnoses    Allergic dermatitis       Relevant Medications   triamcinolone cream (KENALOG) 0.1 %    Uncertain exact allergic trigger for her current rash, seems less likely consistent with medication  induced Losartan, but will change med back to 50mg  anyway. Call when ready for refill.  - Reassuring based on exam, no sign of systemic or other more severe allergy or angioedema - Rx Triamcinolone cream printed given, she has limited financial to get treatment now, she will defer - Follow-up as planned within 4 weeks   Orders Placed This Encounter  Procedures  . Ambulatory referral to Home Health    Referral Priority:   Routine    Referral Type:   Home Health Care    Referral Reason:   Specialty Services Required    Referred to Provider:   [facility]    Requested Specialty:   Home Health Services    Number of Visits Requested:   1    Meds ordered this encounter  Medications  . triamcinolone cream (KENALOG) 0.1 %    Sig: Apply 1 application topically 3 (three) times daily as needed.    Dispense:  30 g    Refill:  0    Follow up plan: Return if symptoms worsen or fail to improve, for keep apt in October.  Saralyn Pilar, DO [facility] 07/14/2018, 5:23 PM

## 2018-07-15 DIAGNOSIS — J45909 Unspecified asthma, uncomplicated: Secondary | ICD-10-CM | POA: Diagnosis not present

## 2018-07-18 ENCOUNTER — Telehealth: Payer: Self-pay | Admitting: Family Medicine

## 2018-07-18 DIAGNOSIS — G894 Chronic pain syndrome: Secondary | ICD-10-CM | POA: Diagnosis not present

## 2018-07-18 DIAGNOSIS — I10 Essential (primary) hypertension: Secondary | ICD-10-CM | POA: Diagnosis not present

## 2018-07-18 DIAGNOSIS — E782 Mixed hyperlipidemia: Secondary | ICD-10-CM | POA: Diagnosis not present

## 2018-07-18 DIAGNOSIS — G43909 Migraine, unspecified, not intractable, without status migrainosus: Secondary | ICD-10-CM | POA: Diagnosis not present

## 2018-07-18 DIAGNOSIS — Z9181 History of falling: Secondary | ICD-10-CM | POA: Diagnosis not present

## 2018-07-18 DIAGNOSIS — L239 Allergic contact dermatitis, unspecified cause: Secondary | ICD-10-CM | POA: Diagnosis not present

## 2018-07-18 DIAGNOSIS — K5900 Constipation, unspecified: Secondary | ICD-10-CM | POA: Diagnosis not present

## 2018-07-18 DIAGNOSIS — J4520 Mild intermittent asthma, uncomplicated: Secondary | ICD-10-CM | POA: Diagnosis not present

## 2018-07-18 NOTE — Telephone Encounter (Signed)
[facility] for PT and OT evals and for a medical social worker to go out [phone]

## 2018-07-18 NOTE — Telephone Encounter (Signed)
Please contact [facility] back to provide verbal authorization for the requested services PT, OT, SW.  I saw patient on 07/14/18 and [facility].  Saralyn PilarAlexander Luticia Tadros, DO [facility] 07/18/2018, 3:55 PM

## 2018-07-20 DIAGNOSIS — I10 Essential (primary) hypertension: Secondary | ICD-10-CM | POA: Diagnosis not present

## 2018-07-20 DIAGNOSIS — J4520 Mild intermittent asthma, uncomplicated: Secondary | ICD-10-CM | POA: Diagnosis not present

## 2018-07-20 DIAGNOSIS — Z9181 History of falling: Secondary | ICD-10-CM | POA: Diagnosis not present

## 2018-07-20 DIAGNOSIS — E782 Mixed hyperlipidemia: Secondary | ICD-10-CM | POA: Diagnosis not present

## 2018-07-20 DIAGNOSIS — L239 Allergic contact dermatitis, unspecified cause: Secondary | ICD-10-CM | POA: Diagnosis not present

## 2018-07-20 DIAGNOSIS — G43909 Migraine, unspecified, not intractable, without status migrainosus: Secondary | ICD-10-CM | POA: Diagnosis not present

## 2018-07-20 DIAGNOSIS — K5900 Constipation, unspecified: Secondary | ICD-10-CM | POA: Diagnosis not present

## 2018-07-20 DIAGNOSIS — G894 Chronic pain syndrome: Secondary | ICD-10-CM | POA: Diagnosis not present

## 2018-07-22 DIAGNOSIS — K5900 Constipation, unspecified: Secondary | ICD-10-CM | POA: Diagnosis not present

## 2018-07-22 DIAGNOSIS — G894 Chronic pain syndrome: Secondary | ICD-10-CM | POA: Diagnosis not present

## 2018-07-22 DIAGNOSIS — G43909 Migraine, unspecified, not intractable, without status migrainosus: Secondary | ICD-10-CM | POA: Diagnosis not present

## 2018-07-22 DIAGNOSIS — Z9181 History of falling: Secondary | ICD-10-CM | POA: Diagnosis not present

## 2018-07-22 DIAGNOSIS — J4520 Mild intermittent asthma, uncomplicated: Secondary | ICD-10-CM | POA: Diagnosis not present

## 2018-07-22 DIAGNOSIS — L239 Allergic contact dermatitis, unspecified cause: Secondary | ICD-10-CM | POA: Diagnosis not present

## 2018-07-22 DIAGNOSIS — I10 Essential (primary) hypertension: Secondary | ICD-10-CM | POA: Diagnosis not present

## 2018-07-22 DIAGNOSIS — E782 Mixed hyperlipidemia: Secondary | ICD-10-CM | POA: Diagnosis not present

## 2018-07-23 DIAGNOSIS — G894 Chronic pain syndrome: Secondary | ICD-10-CM | POA: Diagnosis not present

## 2018-07-23 DIAGNOSIS — Z9181 History of falling: Secondary | ICD-10-CM | POA: Diagnosis not present

## 2018-07-23 DIAGNOSIS — L239 Allergic contact dermatitis, unspecified cause: Secondary | ICD-10-CM | POA: Diagnosis not present

## 2018-07-23 DIAGNOSIS — J4520 Mild intermittent asthma, uncomplicated: Secondary | ICD-10-CM | POA: Diagnosis not present

## 2018-07-23 DIAGNOSIS — K5900 Constipation, unspecified: Secondary | ICD-10-CM | POA: Diagnosis not present

## 2018-07-23 DIAGNOSIS — I10 Essential (primary) hypertension: Secondary | ICD-10-CM | POA: Diagnosis not present

## 2018-07-23 DIAGNOSIS — G43909 Migraine, unspecified, not intractable, without status migrainosus: Secondary | ICD-10-CM | POA: Diagnosis not present

## 2018-07-23 DIAGNOSIS — E782 Mixed hyperlipidemia: Secondary | ICD-10-CM | POA: Diagnosis not present

## 2018-07-25 ENCOUNTER — Encounter: Payer: Self-pay | Admitting: Family Medicine

## 2018-07-25 ENCOUNTER — Telehealth: Payer: Self-pay | Admitting: Family Medicine

## 2018-07-25 DIAGNOSIS — E782 Mixed hyperlipidemia: Secondary | ICD-10-CM | POA: Diagnosis not present

## 2018-07-25 DIAGNOSIS — G894 Chronic pain syndrome: Secondary | ICD-10-CM | POA: Diagnosis not present

## 2018-07-25 DIAGNOSIS — J4520 Mild intermittent asthma, uncomplicated: Secondary | ICD-10-CM | POA: Diagnosis not present

## 2018-07-25 DIAGNOSIS — I10 Essential (primary) hypertension: Secondary | ICD-10-CM | POA: Diagnosis not present

## 2018-07-25 DIAGNOSIS — K5900 Constipation, unspecified: Secondary | ICD-10-CM | POA: Diagnosis not present

## 2018-07-25 DIAGNOSIS — G43909 Migraine, unspecified, not intractable, without status migrainosus: Secondary | ICD-10-CM | POA: Diagnosis not present

## 2018-07-25 DIAGNOSIS — Z9181 History of falling: Secondary | ICD-10-CM | POA: Diagnosis not present

## 2018-07-25 DIAGNOSIS — L239 Allergic contact dermatitis, unspecified cause: Secondary | ICD-10-CM | POA: Diagnosis not present

## 2018-07-25 NOTE — Telephone Encounter (Signed)
Could not reach Kirsten [name] left voicemail.

## 2018-07-25 NOTE — Telephone Encounter (Signed)
Crystal returned phone call, patient preferred to choose different Home Health care and [facility]. [facility] needs to assess patient for home aid for IADL service. [employer] they want provider to fill out PCS request form so that they can assess patient.

## 2018-07-25 NOTE — Telephone Encounter (Addendum)
[facility] said pt has requested an in home aid.  [facility] for [facility] for aid.  Her call back number is [phone]

## 2018-07-25 NOTE — Progress Notes (Signed)
FORM COMPLETION - Personal Care Services (PCS)  Indication: [employer] [facility]  Date of last office visit: 07/14/18  Medical Diagnosis List - ICD10 1. Post concussion syndrome - F07.81 (onset 07/2014) 2. Memory Loss - R41.3 (07/2014) 3. Migraine Headaches - G43.909 (11/2015) 4. Osteoarthritis Hips, Bilateral - M16.0 (11/2015) All impact ADL [x]  Yes  Clinical judgement, ADL limitations are: Chronic and stable Medically stable - Yes 24 hour caregiver availability required - No  Optional - No increased level of supervision required.  Forms completed, signed, dated and prepared to be faxed back.  Kirsten PilarAlexander Karamalegos, DO [facility] 07/25/2018, 6:23 PM

## 2018-07-25 NOTE — Telephone Encounter (Signed)
Called Crystal back, did not reach, left voicemail.  I am not sure what needs to be done next for this patient. I saw her last on 07/14/18 and we ordered [facility] at that time. I would assume if [facility] needed any additional orders or support they would notify us instead.  I would recommend next that patient be called to see what she is needing or requesting, and make sure she should check with [facility] first, instead of a new company.  If she needs a new order for Home Aide then usually the company needs to fax us paperwork or an order and we can complete that.  Saralyn PilarAlexander Zyon Rosser, DO [facility] 07/25/2018, 10:54 AM

## 2018-07-28 DIAGNOSIS — G43909 Migraine, unspecified, not intractable, without status migrainosus: Secondary | ICD-10-CM | POA: Diagnosis not present

## 2018-07-28 DIAGNOSIS — J4520 Mild intermittent asthma, uncomplicated: Secondary | ICD-10-CM | POA: Diagnosis not present

## 2018-07-28 DIAGNOSIS — K5900 Constipation, unspecified: Secondary | ICD-10-CM | POA: Diagnosis not present

## 2018-07-28 DIAGNOSIS — L239 Allergic contact dermatitis, unspecified cause: Secondary | ICD-10-CM | POA: Diagnosis not present

## 2018-07-28 DIAGNOSIS — G894 Chronic pain syndrome: Secondary | ICD-10-CM | POA: Diagnosis not present

## 2018-07-28 DIAGNOSIS — I10 Essential (primary) hypertension: Secondary | ICD-10-CM | POA: Diagnosis not present

## 2018-07-28 DIAGNOSIS — Z9181 History of falling: Secondary | ICD-10-CM | POA: Diagnosis not present

## 2018-07-28 DIAGNOSIS — E782 Mixed hyperlipidemia: Secondary | ICD-10-CM | POA: Diagnosis not present

## 2018-07-29 ENCOUNTER — Telehealth: Payer: Self-pay | Admitting: Family Medicine

## 2018-07-29 NOTE — Telephone Encounter (Signed)
Handwritten order on rx pad for these, completed on 07/29/18. To be faxed.  [facility]  She was already ordered [facility] / [facility]  Saralyn PilarAlexander Karamalegos, DO [facility] 07/29/2018, 4:57 PM

## 2018-07-29 NOTE — Telephone Encounter (Signed)
[facility] orders for social worker consult for financial issues and telehealth to monitor BP.  Her call back number is [phone].  Fax number is [phone]

## 2018-07-29 NOTE — Telephone Encounter (Signed)
Rx order faxed.

## 2018-08-05 ENCOUNTER — Other Ambulatory Visit: Payer: Medicare Other

## 2018-08-07 DIAGNOSIS — E782 Mixed hyperlipidemia: Secondary | ICD-10-CM | POA: Diagnosis not present

## 2018-08-07 DIAGNOSIS — I10 Essential (primary) hypertension: Secondary | ICD-10-CM | POA: Diagnosis not present

## 2018-08-07 DIAGNOSIS — G894 Chronic pain syndrome: Secondary | ICD-10-CM | POA: Diagnosis not present

## 2018-08-07 DIAGNOSIS — J4520 Mild intermittent asthma, uncomplicated: Secondary | ICD-10-CM | POA: Diagnosis not present

## 2018-08-07 DIAGNOSIS — Z9181 History of falling: Secondary | ICD-10-CM | POA: Diagnosis not present

## 2018-08-07 DIAGNOSIS — L239 Allergic contact dermatitis, unspecified cause: Secondary | ICD-10-CM | POA: Diagnosis not present

## 2018-08-07 DIAGNOSIS — K5900 Constipation, unspecified: Secondary | ICD-10-CM | POA: Diagnosis not present

## 2018-08-07 DIAGNOSIS — G43909 Migraine, unspecified, not intractable, without status migrainosus: Secondary | ICD-10-CM | POA: Diagnosis not present

## 2018-08-08 DIAGNOSIS — J45909 Unspecified asthma, uncomplicated: Secondary | ICD-10-CM | POA: Diagnosis not present

## 2018-08-11 ENCOUNTER — Encounter: Payer: Medicare Other | Admitting: Family Medicine

## 2018-08-11 ENCOUNTER — Telehealth: Payer: Self-pay | Admitting: Family Medicine

## 2018-08-11 NOTE — Telephone Encounter (Signed)
Patient was No Show for apt today.  Acknowledged this update. Sounds like her BP is normal range. She has refused medications in past. She is only on Losartan 100mg  daily (takes x 2 of the 50mg  tabs) she had a reported reaction to the 100mg  tablet in past.  We will follow-up with her BP in future.  Saralyn Pilar, DO [facility] 08/11/2018, 6:17 PM

## 2018-08-11 NOTE — Telephone Encounter (Signed)
[facility] called  [phone], said that pt bp was 116/70  PT was not taken her BP medication pt stated that she was taken Lemon Oil for her BP

## 2018-09-02 DIAGNOSIS — J45909 Unspecified asthma, uncomplicated: Secondary | ICD-10-CM | POA: Diagnosis not present

## 2018-09-05 ENCOUNTER — Other Ambulatory Visit: Payer: Self-pay

## 2018-09-05 DIAGNOSIS — I10 Essential (primary) hypertension: Secondary | ICD-10-CM

## 2018-09-05 DIAGNOSIS — R799 Abnormal finding of blood chemistry, unspecified: Secondary | ICD-10-CM

## 2018-09-05 DIAGNOSIS — E782 Mixed hyperlipidemia: Secondary | ICD-10-CM

## 2018-09-05 DIAGNOSIS — R7309 Other abnormal glucose: Secondary | ICD-10-CM

## 2018-09-05 DIAGNOSIS — Z0000 Encounter for general adult medical examination without abnormal findings: Secondary | ICD-10-CM

## 2018-09-05 DIAGNOSIS — R413 Other amnesia: Secondary | ICD-10-CM

## 2018-09-08 ENCOUNTER — Other Ambulatory Visit: Payer: Medicare Other

## 2018-09-08 DIAGNOSIS — R7309 Other abnormal glucose: Secondary | ICD-10-CM | POA: Diagnosis not present

## 2018-09-08 DIAGNOSIS — E782 Mixed hyperlipidemia: Secondary | ICD-10-CM | POA: Diagnosis not present

## 2018-09-08 DIAGNOSIS — R799 Abnormal finding of blood chemistry, unspecified: Secondary | ICD-10-CM | POA: Diagnosis not present

## 2018-09-08 DIAGNOSIS — I10 Essential (primary) hypertension: Secondary | ICD-10-CM | POA: Diagnosis not present

## 2018-09-08 DIAGNOSIS — R413 Other amnesia: Secondary | ICD-10-CM | POA: Diagnosis not present

## 2018-09-09 LAB — CBC WITH DIFFERENTIAL/PLATELET
BASOS PCT: 0.4 %
Basophils Absolute: 33 cells/uL (ref 0–200)
EOS PCT: 0.5 %
Eosinophils Absolute: 42 cells/uL (ref 15–500)
HCT: 39.6 % (ref 35.0–45.0)
Hemoglobin: 12.6 g/dL (ref 11.7–15.5)
LYMPHS ABS: 2407 {cells}/uL (ref 850–3900)
MCH: 26.2 pg — ABNORMAL LOW (ref 27.0–33.0)
MCHC: 31.8 g/dL — ABNORMAL LOW (ref 32.0–36.0)
MCV: 82.3 fL (ref 80.0–100.0)
MPV: 10.9 fL (ref 7.5–12.5)
Monocytes Relative: 7.1 %
NEUTROS PCT: 63 %
Neutro Abs: 5229 cells/uL (ref 1500–7800)
PLATELETS: 364 10*3/uL (ref 140–400)
RBC: 4.81 10*6/uL (ref 3.80–5.10)
RDW: 13.6 % (ref 11.0–15.0)
TOTAL LYMPHOCYTE: 29 %
WBC mixed population: 589 cells/uL (ref 200–950)
WBC: 8.3 10*3/uL (ref 3.8–10.8)

## 2018-09-09 LAB — LIPID PANEL
CHOL/HDL RATIO: 4.9 (calc) (ref <5.0)
Cholesterol: 258 mg/dL — ABNORMAL HIGH (ref <200)
HDL: 53 mg/dL (ref >50)
LDL CHOLESTEROL (CALC): 186 mg/dL — AB
Non-HDL Cholesterol (Calc): 205 mg/dL (calc) — ABNORMAL HIGH (ref <130)
TRIGLYCERIDES: 81 mg/dL (ref <150)

## 2018-09-09 LAB — HEMOGLOBIN A1C
Hgb A1c MFr Bld: 5.5 % of total Hgb (ref <5.7)
Mean Plasma Glucose: 111 (calc)
eAG (mmol/L): 6.2 (calc)

## 2018-09-09 LAB — COMPLETE METABOLIC PANEL WITH GFR
AG Ratio: 1.5 (calc) (ref 1.0–2.5)
ALBUMIN MSPROF: 4.1 g/dL (ref 3.6–5.1)
ALKALINE PHOSPHATASE (APISO): 66 U/L (ref 33–130)
ALT: 9 U/L (ref 6–29)
AST: 19 U/L (ref 10–35)
BILIRUBIN TOTAL: 0.9 mg/dL (ref 0.2–1.2)
BUN/Creatinine Ratio: 6 (calc) (ref 6–22)
BUN: 6 mg/dL — AB (ref 7–25)
CHLORIDE: 105 mmol/L (ref 98–110)
CO2: 27 mmol/L (ref 20–32)
Calcium: 9.5 mg/dL (ref 8.6–10.4)
Creat: 0.97 mg/dL — ABNORMAL HIGH (ref 0.60–0.93)
GFR, Est African American: 69 mL/min/{1.73_m2} (ref >=60)
GFR, Est Non African American: 59 mL/min/{1.73_m2} — ABNORMAL LOW (ref >=60)
GLUCOSE: 79 mg/dL (ref 65–99)
Globulin: 2.8 g/dL (calc) (ref 1.9–3.7)
Potassium: 3.9 mmol/L (ref 3.5–5.3)
SODIUM: 141 mmol/L (ref 135–146)
Total Protein: 6.9 g/dL (ref 6.1–8.1)

## 2018-09-09 LAB — TSH: TSH: 1.11 m[IU]/L (ref 0.40–4.50)

## 2018-09-15 ENCOUNTER — Encounter: Payer: Self-pay | Admitting: Family Medicine

## 2018-09-15 ENCOUNTER — Ambulatory Visit (INDEPENDENT_AMBULATORY_CARE_PROVIDER_SITE_OTHER): Payer: Medicare Other | Admitting: Family Medicine

## 2018-09-15 VITALS — BP 138/82 | HR 66 | Temp 98.3°F | Resp 16 | Ht 60.0 in | Wt 116.0 lb

## 2018-09-15 DIAGNOSIS — Z0000 Encounter for general adult medical examination without abnormal findings: Secondary | ICD-10-CM | POA: Diagnosis not present

## 2018-09-15 DIAGNOSIS — E782 Mixed hyperlipidemia: Secondary | ICD-10-CM

## 2018-09-15 DIAGNOSIS — F0781 Postconcussional syndrome: Secondary | ICD-10-CM | POA: Diagnosis not present

## 2018-09-15 DIAGNOSIS — N952 Postmenopausal atrophic vaginitis: Secondary | ICD-10-CM | POA: Diagnosis not present

## 2018-09-15 DIAGNOSIS — F339 Major depressive disorder, recurrent, unspecified: Secondary | ICD-10-CM | POA: Insufficient documentation

## 2018-09-15 DIAGNOSIS — Z23 Encounter for immunization: Secondary | ICD-10-CM | POA: Diagnosis not present

## 2018-09-15 DIAGNOSIS — R630 Anorexia: Secondary | ICD-10-CM | POA: Diagnosis not present

## 2018-09-15 DIAGNOSIS — F3342 Major depressive disorder, recurrent, in full remission: Secondary | ICD-10-CM | POA: Insufficient documentation

## 2018-09-15 DIAGNOSIS — I10 Essential (primary) hypertension: Secondary | ICD-10-CM | POA: Diagnosis not present

## 2018-09-15 MED ORDER — MIRTAZAPINE 15 MG PO TABS: 15.0000 mg | ORAL_TABLET | Freq: Every day | ORAL | 1 refills | Status: DC

## 2018-09-15 MED ORDER — ROSUVASTATIN CALCIUM 20 MG PO TABS: 20.0000 mg | ORAL_TABLET | Freq: Every day | ORAL | 3 refills | Status: DC

## 2018-09-15 MED ORDER — ESTROGENS, CONJUGATED 0.625 MG/GM VA CREA: 1.0000 | TOPICAL_CREAM | Freq: Every day | VAGINAL | 1 refills | Status: DC

## 2018-09-15 MED ORDER — HYDROCHLOROTHIAZIDE 25 MG PO TABS: 25.0000 mg | ORAL_TABLET | Freq: Every day | ORAL | 3 refills | Status: DC

## 2018-09-15 NOTE — Progress Notes (Signed)
Subjective:    Patient ID: Kirsten [name], female    DOB: 1945-11-30, 72 y.o.   MRN: [id number]  Kirsten [name] is a 72 y.o. female presenting on 09/15/2018 for Annual Exam   HPI   Here for Annual Physical and Lab Review.  Recurrent Depression, with mixed anxiety / Poor Appetite Reports some chronic history of mood problem, had been better in past, now recently has some worsening mood. Describes she had to move multiple times recently. Admits some reduced appetite due to poor mood, and some insomnia. Not tried medicine for it recently  CHRONIC HTN: Interval update - she had reaction on Losartan, still even at lower dose, request to change to other medication. In past had been on thiazide. Tolerated well. No recent home BP readings OFF Losartan - Prior meds ACEi cough and allergy eye swelling on amlodipine Reports good compliance, took meds today. Tolerating well, w/o complaints.  HYPERLIPIDEMIA: - Reports no concerns. Last lipid panel 09/2018, elevated LDL - She stopped taking Rosuvastatin previously, not due to myalgia, but just stopped  History of Lower Extremity Pain / Restless Leg Syndrome Reports this keeps her awake at night, feeling of some aches in back and legs worse when laying down at night, has sensation of restless leg needs to move.  Vaginal Atrophy Admits history of vaginal dryness and atrophy. Never took the topical premarin cream that was previously rx. In past she had history of hysterectomy and was on oral HRT.  Health Maintenance: Due for Flu Shot, will receive today    Depression screen [facility] 2/9 09/15/2018 07/14/2018 05/09/2018  Decreased Interest 2 0 0  Down, Depressed, Hopeless 2 0 0  PHQ - 2 Score 4 0 0  Altered sleeping 3 - -  Tired, decreased energy 2 - -  Change in appetite 2 - -  Feeling bad or failure about yourself  3 - -  Trouble concentrating 2 - -  Moving slowly or fidgety/restless 2 - -  Suicidal thoughts 2 - -  PHQ-9 Score 20 - -    Difficult doing work/chores Somewhat difficult - -   [place]-Suicide Severity Rating Scale 1) Have you wished you were dead or wished you could go to sleep and not wake up? - Yes  2) Have you had any actual thoughts of killing yourself? - No  Skip questions 3,4, 5  6) Have you ever done anything, started to do anything, or prepared to do anything to end your life? - No   GAD 7 : Generalized Anxiety Score 09/15/2018  Nervous, Anxious, on Edge 2  Control/stop worrying 3  Worry too much - different things 3  Trouble relaxing 2  Restless 3  Easily annoyed or irritable 2  Afraid - awful might happen 2  Total GAD 7 Score 17  Anxiety Difficulty Somewhat difficult    Past Medical History:  Diagnosis Date  . Allergy   . Arthritis   . Asthma   . Back injury   . Heart disease   . History of blood clots   . History of chicken pox   . Hyperlipidemia   . Hypertension   . Memory loss   . Migraine headache   . Myocardial infarction [facility])    Past Surgical History:  Procedure Laterality Date  . ABDOMINAL HYSTERECTOMY    . APPENDECTOMY    . BOWEL RESECTION    . COLONOSCOPY WITH PROPOFOL N/A 06/17/2017   Procedure: COLONOSCOPY WITH PROPOFOL;  Surgeon: Christena Deem,  MD;  Location: [facility] ENDOSCOPY;  Service: Endoscopy;  Laterality: N/A;   Social History   Socioeconomic History  . Marital status: Widowed    Spouse name: Not on file  . Number of children: Not on file  . Years of education: Not on file  . Highest education level: Not on file  Occupational History  . Not on file  Social Needs  . Financial resource strain: Not hard at all  . Food insecurity:    Worry: Never true    Inability: Never true  . Transportation needs:    Medical: No    Non-medical: No  Tobacco Use  . Smoking status: Never Smoker  . Smokeless tobacco: Never Used  Substance and Sexual Activity  . Alcohol use: No    Alcohol/week: 0.0 standard drinks  . Drug use: No  . Sexual activity: Not on  file  Lifestyle  . Physical activity:    Days per week: 0 days    Minutes per session: 0 min  . Stress: Not at all  Relationships  . Social connections:    Talks on phone: More than three times a week    Gets together: Twice a week    Attends religious service: More than 4 times per year    Active member of club or organization: No    Attends meetings of clubs or organizations: Never    Relationship status: Widowed  . Intimate partner violence:    Fear of current or ex partner: No    Emotionally abused: No    Physically abused: No    Forced sexual activity: No  Other Topics Concern  . Not on file  Social History Narrative  . Not on file   Family History  Problem Relation Age of Onset  . Cancer Father        unknown type   Current Outpatient Medications on File Prior to Visit  Medication Sig  . albuterol (PROVENTIL HFA) 108 (90 BASE) MCG/ACT inhaler Inhale 2 puffs into the lungs every 6 (six) hours as needed for wheezing or shortness of breath.  Marland Kitchen aspirin EC 81 MG tablet Take 1 tablet (81 mg total) by mouth daily.  . fexofenadine (ALLEGRA) 180 MG tablet Take 1 tablet (180 mg total) by mouth daily.  . fluticasone (FLONASE) 50 MCG/ACT nasal spray Place 2 sprays into both nostrils daily. Use for 4-6 weeks then stop and use seasonally or as needed.  . gabapentin (NEURONTIN) 100 MG capsule Start 1 capsule at bedtime, increase by 1 cap every 3 days as tolerated up to max dose 3 pills at night.  . lubiprostone (AMITIZA) 24 MCG capsule Take 24 mcg by mouth 2 (two) times daily with a meal.  . meloxicam (MOBIC) 15 MG tablet Take 15 mg by mouth daily.  . montelukast (SINGULAIR) 10 MG tablet Take 1 tablet (10 mg total) by mouth at bedtime.  . polyethylene glycol powder (GLYCOLAX/MIRALAX) powder Take 17 g by mouth 2 (two) times daily as needed for mild constipation or moderate constipation.  . senna (SENOKOT) 8.6 MG TABS tablet Take 1 tablet (8.6 mg total) by mouth daily.  Marland Kitchen triamcinolone  cream (KENALOG) 0.1 % Apply 1 application topically 3 (three) times daily as needed.   No current facility-administered medications on file prior to visit.     Review of Systems  Constitutional: Positive for appetite change. Negative for activity change, chills, diaphoresis, fatigue and fever.  HENT: Negative for congestion and hearing loss.   Eyes: Negative  for visual disturbance.  Respiratory: Negative for apnea, cough, choking, chest tightness, shortness of breath and wheezing.   Cardiovascular: Negative for chest pain, palpitations and leg swelling.  Gastrointestinal: Negative for abdominal pain, anal bleeding, blood in stool, constipation, diarrhea, nausea and vomiting.  Endocrine: Negative for cold intolerance.  Genitourinary: Negative for difficulty urinating, dysuria, frequency and hematuria.       Vaginal dry / atrophy  Musculoskeletal: Negative for arthralgias, back pain and neck pain.  Skin: Negative for rash.  Allergic/Immunologic: Negative for environmental allergies.  Neurological: Negative for dizziness, weakness, light-headedness, numbness and headaches.  Hematological: Negative for adenopathy.  Psychiatric/Behavioral: Positive for dysphoric mood. Negative for behavioral problems and sleep disturbance. The patient is not nervous/anxious.    Per HPI unless specifically indicated above     Objective:    BP 138/82 (BP Location: Left Arm, Cuff Size: Normal)   Pulse 66   Temp 98.3 F (36.8 C) (Oral)   Resp 16   Ht 5' (1.524 m)   Wt 116 lb (52.6 kg)   BMI 22.65 kg/m   Wt Readings from Last 3 Encounters:  09/15/18 116 lb (52.6 kg)  07/14/18 116 lb 6.4 oz (52.8 kg)  05/09/18 121 lb (54.9 kg)    Physical Exam  Constitutional: She is oriented to person, place, and time. She appears well-developed and well-nourished. No distress.  Well-appearing, comfortable, cooperative  HENT:  Head: Normocephalic and atraumatic.  Mouth/Throat: Oropharynx is clear and moist.    Frontal / maxillary sinuses non-tender. Nares patent without purulence or edema. Bilateral TMs clear without erythema, effusion or bulging. Oropharynx clear without erythema, exudates, edema or asymmetry.  Eyes: Pupils are equal, round, and reactive to light. Conjunctivae and EOM are normal. Right eye exhibits no discharge. Left eye exhibits no discharge.  Neck: Normal range of motion. Neck supple. No thyromegaly present.  Cardiovascular: Normal rate, regular rhythm, normal heart sounds and intact distal pulses.  No murmur heard. Pulmonary/Chest: Effort normal and breath sounds normal. No respiratory distress. She has no wheezes. She has no rales.  Abdominal: Soft. Bowel sounds are normal. She exhibits no distension and no mass. There is no tenderness.  Musculoskeletal: Normal range of motion. She exhibits no edema or tenderness.  Upper / Lower Extremities: - Normal muscle tone, strength bilateral upper extremities 5/5, lower extremities 5/5  Lymphadenopathy:    She has no cervical adenopathy.  Neurological: She is alert and oriented to person, place, and time.  Distal sensation intact to light touch all extremities  Skin: Skin is warm and dry. No rash noted. She is not diaphoretic. No erythema.  Psychiatric: She has a normal mood and affect. Her behavior is normal.  Well groomed, good eye contact, normal speech and thoughts  Nursing note and vitals reviewed.  Results for orders placed or performed in visit on 09/05/18  TSH  Result Value Ref Range   TSH 1.11 0.40 - 4.50 mIU/L  Lipid panel  Result Value Ref Range   Cholesterol 258 (H) <200 mg/dL   HDL 53 >16 mg/dL   Triglycerides 81 <109 mg/dL   LDL Cholesterol (Calc) 186 (H) mg/dL (calc)   Total CHOL/HDL Ratio 4.9 <5.0 (calc)   Non-HDL Cholesterol (Calc) 205 (H) <130 mg/dL (calc)  COMPLETE METABOLIC PANEL WITH GFR  Result Value Ref Range   Glucose, Bld 79 65 - 99 mg/dL   BUN 6 (L) 7 - 25 mg/dL   Creat 6.04 (H) 5.40 - 0.93 mg/dL    GFR, Est Non  African American 59 (L) > OR = 60 mL/min/1.52m2   GFR, Est African American 69 > OR = 60 mL/min/1.96m2   BUN/Creatinine Ratio 6 6 - 22 (calc)   Sodium 141 135 - 146 mmol/L   Potassium 3.9 3.5 - 5.3 mmol/L   Chloride 105 98 - 110 mmol/L   CO2 27 20 - 32 mmol/L   Calcium 9.5 8.6 - 10.4 mg/dL   Total Protein 6.9 6.1 - 8.1 g/dL   Albumin 4.1 3.6 - 5.1 g/dL   Globulin 2.8 1.9 - 3.7 g/dL (calc)   AG Ratio 1.5 1.0 - 2.5 (calc)   Total Bilirubin 0.9 0.2 - 1.2 mg/dL   Alkaline phosphatase (APISO) 66 33 - 130 U/L   AST 19 10 - 35 U/L   ALT 9 6 - 29 U/L  CBC with Differential/Platelet  Result Value Ref Range   WBC 8.3 3.8 - 10.8 Thousand/uL   RBC 4.81 3.80 - 5.10 Million/uL   Hemoglobin 12.6 11.7 - 15.5 g/dL   HCT 16.1 09.6 - 04.5 %   MCV 82.3 80.0 - 100.0 fL   MCH 26.2 (L) 27.0 - 33.0 pg   MCHC 31.8 (L) 32.0 - 36.0 g/dL   RDW 40.9 81.1 - 91.4 %   Platelets 364 140 - 400 Thousand/uL   MPV 10.9 7.5 - 12.5 fL   Neutro Abs 5,229 1,500 - 7,800 cells/uL   Lymphs Abs 2,407 850 - 3,900 cells/uL   WBC mixed population 589 200 - 950 cells/uL   Eosinophils Absolute 42 15 - 500 cells/uL   Basophils Absolute 33 0 - 200 cells/uL   Neutrophils Relative % 63 %   Total Lymphocyte 29.0 %   Monocytes Relative 7.1 %   Eosinophils Relative 0.5 %   Basophils Relative 0.4 %  Hemoglobin A1c  Result Value Ref Range   Hgb A1c MFr Bld 5.5 <5.7 % of total Hgb   Mean Plasma Glucose 111 (calc)   eAG (mmol/L) 6.2 (calc)      Assessment & Plan:   Problem List Items Addressed This Visit    Hyperlipidemia Elevated LDL Off statin  Plan Restart Rosuvastatin 20mg     Relevant Medications   rosuvastatin (CRESTOR) 20 MG tablet   hydrochlorothiazide (HYDRODIURIL) 25 MG tablet   Hypertension Improved BP on re-check, still above goal Off Losartan - side effect Failed Amlodipine lisinopril in past  Plan Restart HCTZ 25mg  daily Monitor BP outside office    Relevant Medications    rosuvastatin (CRESTOR) 20 MG tablet   hydrochlorothiazide (HYDRODIURIL) 25 MG tablet   Post concussion syndrome   Relevant Medications   mirtazapine (REMERON) 15 MG tablet   Recurrent depression (HCC) Recent worsening, see PHQ/GAD, life stressors, moving Poor appetite secondary  Trial on Mirtazapine for appetite, mood and sleep May adjust dose in future    Relevant Medications   mirtazapine (REMERON) 15 MG tablet    Other Visit Diagnoses    Annual physical exam    -  Primary  Updated Health Maintenance information Reviewed recent lab results with patient Encouraged improvement to lifestyle with diet and exercise    Post-menopausal atrophic vaginitis     S/p hysterectomy - advised against HRT at this time Trial on Premarin topical vaginal If not improving consider follow-up with GYN in future    Relevant Medications   conjugated estrogens (PREMARIN) vaginal cream   Decrease in appetite     Trial on Mirtaapine   Relevant Medications   mirtazapine (REMERON) 15 MG  tablet   Needs flu shot       Relevant Orders   Flu vaccine HIGH DOSE PF (Completed)      Meds ordered this encounter  Medications  . rosuvastatin (CRESTOR) 20 MG tablet    Sig: Take 1 tablet (20 mg total) by mouth daily.    Dispense:  90 tablet    Refill:  3  . conjugated estrogens (PREMARIN) vaginal cream    Sig: Place 1 Applicatorful vaginally daily.    Dispense:  90 g    Refill:  1  . hydrochlorothiazide (HYDRODIURIL) 25 MG tablet    Sig: Take 1 tablet (25 mg total) by mouth daily.    Dispense:  90 tablet    Refill:  3  . mirtazapine (REMERON) 15 MG tablet    Sig: Take 1 tablet (15 mg total) by mouth at bedtime.    Dispense:  90 tablet    Refill:  1    Follow up plan: Return in about 3 months (around 12/16/2018) for 3-4 months for HTN, Mood/Anxiety med adjust.  Saralyn Pilar, DO [facility] 09/15/2018, 5:09 PM

## 2018-09-15 NOTE — Patient Instructions (Addendum)
Thank you for coming to the office today.  Start Mirtazapine 15mg  nightly - for depression and nerves, it can help sleep and improve appetite - if NOT improved by 3-4 weeks, call or return and we can INCREASE up to 30mg  per dose.  Start back on Rosuvastatin cholesterol med  STOP Losartan  START Hydrochlorothiazide for BP  Use topical Premarin cream  1. Chemistry - Slightly elevated Creatinine to 0.97, otherwise normal electrolytes and liver function.  2. Hemoglobin A1c (Diabetes screening) - 5.5, normal not in range of Pre-Diabetes (>5.7 to 6.4)   3. TSH Thyroid Function Tests - Normal.  4. Cholesterol - Significantly elevated LDL cholesterol.  5. CBC Blood Counts - Normal, no anemia, other abnormality  ------  If thoughts of harming yourself or others, or any significant concern about your safety, please call for help immediately:  - [facility] 24 Hour Help Line [phone] or [phone] - Suicide prevention hotline [phone]  - 911  ----------------  For Mammogram screening for breast cancer   Call the Imaging Center below anytime to schedule your own appointment now that order has been placed.  [facility] [address] [place], [place] [address] Phone: [phone]   Please schedule a Follow-up Appointment to: Return in about 3 months (around 12/16/2018) for 3-4 months for HTN, Mood/Anxiety med adjust.  If you have any other questions or concerns, please feel free to call the office or send a message through MyChart. You may also schedule an earlier appointment if necessary.  Additionally, you may be receiving a survey about your experience at our office within a few days to 1 week by e-mail or mail. We value your feedback.  Saralyn Pilar, DO [facility], [place]

## 2018-09-29 DIAGNOSIS — J45909 Unspecified asthma, uncomplicated: Secondary | ICD-10-CM | POA: Diagnosis not present

## 2019-01-06 ENCOUNTER — Ambulatory Visit: Payer: Medicare Other | Admitting: Family Medicine

## 2019-02-13 DIAGNOSIS — J45909 Unspecified asthma, uncomplicated: Secondary | ICD-10-CM | POA: Diagnosis not present

## 2019-03-10 DIAGNOSIS — J45909 Unspecified asthma, uncomplicated: Secondary | ICD-10-CM | POA: Diagnosis not present

## 2019-04-01 DIAGNOSIS — J45909 Unspecified asthma, uncomplicated: Secondary | ICD-10-CM | POA: Diagnosis not present

## 2019-04-21 ENCOUNTER — Ambulatory Visit: Payer: Medicare Other

## 2019-04-27 DIAGNOSIS — J45909 Unspecified asthma, uncomplicated: Secondary | ICD-10-CM | POA: Diagnosis not present

## 2019-05-22 ENCOUNTER — Other Ambulatory Visit: Payer: Self-pay

## 2019-05-22 ENCOUNTER — Encounter: Payer: Self-pay | Admitting: Family Medicine

## 2019-05-22 ENCOUNTER — Ambulatory Visit (INDEPENDENT_AMBULATORY_CARE_PROVIDER_SITE_OTHER): Payer: Medicare Other | Admitting: Family Medicine

## 2019-05-22 VITALS — BP 145/78 | HR 65 | Ht 60.0 in | Wt 122.2 lb

## 2019-05-22 DIAGNOSIS — J3089 Other allergic rhinitis: Secondary | ICD-10-CM | POA: Diagnosis not present

## 2019-05-22 DIAGNOSIS — I10 Essential (primary) hypertension: Secondary | ICD-10-CM

## 2019-05-22 DIAGNOSIS — M545 Low back pain: Secondary | ICD-10-CM

## 2019-05-22 DIAGNOSIS — E782 Mixed hyperlipidemia: Secondary | ICD-10-CM

## 2019-05-22 DIAGNOSIS — G894 Chronic pain syndrome: Secondary | ICD-10-CM | POA: Diagnosis not present

## 2019-05-22 DIAGNOSIS — J4520 Mild intermittent asthma, uncomplicated: Secondary | ICD-10-CM

## 2019-05-22 DIAGNOSIS — G8929 Other chronic pain: Secondary | ICD-10-CM

## 2019-05-22 DIAGNOSIS — G2581 Restless legs syndrome: Secondary | ICD-10-CM

## 2019-05-22 MED ORDER — RESPERATE 1.0 KIT: PACK | 0 refills | Status: AC

## 2019-05-22 MED ORDER — FLUTICASONE PROPIONATE 50 MCG/ACT NA SUSP: 2.0000 | Freq: Every day | NASAL | 3 refills | Status: DC

## 2019-05-22 MED ORDER — ROSUVASTATIN CALCIUM 20 MG PO TABS: 20.0000 mg | ORAL_TABLET | Freq: Every day | ORAL | 3 refills | Status: DC

## 2019-05-22 MED ORDER — ALBUTEROL SULFATE HFA 108 (90 BASE) MCG/ACT IN AERS: 2.0000 | INHALATION_SPRAY | Freq: Four times a day (QID) | RESPIRATORY_TRACT | 2 refills | Status: DC | PRN

## 2019-05-22 MED ORDER — GABAPENTIN 300 MG PO CAPS: 300.0000 mg | ORAL_CAPSULE | Freq: Every day | ORAL | 1 refills | Status: DC

## 2019-05-22 MED ORDER — HYDROCHLOROTHIAZIDE 25 MG PO TABS: 25.0000 mg | ORAL_TABLET | Freq: Every day | ORAL | 3 refills | Status: DC

## 2019-05-22 NOTE — Patient Instructions (Addendum)
Thank you for coming to the office today.  We have ordered medicine refills to [facility].  We will fax the order for the Resperate Machine to [facility], stay tuned for more information, will take some time to to get it authorized if we can if any problems, will let you know.  Start taking Gabapentin 300mg  nightly for back pain. If not effective, can take an additional pill a day if needed.  Please schedule a Follow-up Appointment to: Return in about 3 months (around 08/22/2019) for HTN, Back Pain.  If you have any other questions or concerns, please feel free to call the office or send a message through [place]. You may also schedule an earlier appointment if necessary.  Additionally, you may be receiving a survey about your experience at our office within a few days to 1 week by e-mail or mail. We value your feedback.  Nobie Putnam, DO [place]

## 2019-05-22 NOTE — Progress Notes (Signed)
Subjective:    Patient ID: Kirsten [name], female    DOB: Mar 19, 1946, 73 y.o.   MRN: [id number]  Kirsten [name] is a 73 y.o. female presenting on 05/22/2019 for Hypertension   HPI   CHRONIC HTN: Background history difficult to control HTN. Failed multiple medications due to side effects and intolerance, including Losartan, Amlodipine, Lisinopril - she had allergy to medications as well. Home BP readings mild elevated. Today asking about if we can get authorization on Resperate Machine for lowering BP and regulating breathing. Taking HCTZ 75m daily - needs refill [facility] Reports good compliance, took meds today. Tolerating well, w/o complaints. Denies CP, dyspnea, HA, edema, dizziness / lightheadedness  HYPERLIPIDEMIA: - Reports no concerns - Currently taking Rosuvastatin 241m tolerating well without side effects or myalgias - needs re order  Low Back Pain, Chronic Previously trial on Gabapentin 100 TID but she did not adhere to the 3 times a day dosing, it was ineffective at low dose, did not recall increasing dose. Asking about pain medication to help her back, episodic flare up pain.   [facility] 05/22/2019 09/15/2018 07/14/2018  Decreased Interest 2 2 0  Down, Depressed, Hopeless 1 2 0  PHQ - 2 Score 3 4 0  Altered sleeping 2 3 -  Tired, decreased energy 2 2 -  Change in appetite 2 2 -  Feeling bad or failure about yourself  0 3 -  Trouble concentrating 0 2 -  Moving slowly or fidgety/restless 0 2 -  Suicidal thoughts 0 2 -  PHQ-9 Score 9 20 -  Difficult doing work/chores Not difficult at all Somewhat difficult -   GAD 7 : Generalized Anxiety Score 09/15/2018  Nervous, Anxious, on Edge 2  Control/stop worrying 3  Worry too much - different things 3  Trouble relaxing 2  Restless 3  Easily annoyed or irritable 2  Afraid - awful might happen 2  Total GAD 7 Score 17  Anxiety Difficulty Somewhat difficult     Social  History   Tobacco Use  . Smoking status: Never Smoker  . Smokeless tobacco: Never Used  Substance Use Topics  . Alcohol use: No    Alcohol/week: 0.0 standard drinks  . Drug use: No    Review of Systems Per HPI unless specifically indicated above     Objective:    BP (!) 145/78 (BP Location: Left Arm, Patient Position: Sitting, Cuff Size: Normal)   Pulse 65   Ht 5' (1.524 m)   Wt 122 lb 3.2 oz (55.4 kg)   BMI 23.87 kg/m   Wt Readings from Last 3 Encounters:  05/22/19 122 lb 3.2 oz (55.4 kg)  09/15/18 116 lb (52.6 kg)  07/14/18 116 lb 6.4 oz (52.8 kg)    Physical Exam Vitals signs and nursing note reviewed.  Constitutional:      General: She is not in acute distress.    Appearance: She is well-developed. She is not diaphoretic.     Comments: Well-appearing, comfortable, cooperative  HENT:     Head: Normocephalic and atraumatic.  Eyes:     General:        Right eye: No discharge.        Left eye: No discharge.     Conjunctiva/sclera: Conjunctivae normal.  Neck:     Musculoskeletal: Normal range of motion and neck supple.     Thyroid: No thyromegaly.  Cardiovascular:     Rate and Rhythm: Normal rate  and regular rhythm.     Heart sounds: Normal heart sounds. No murmur.  Pulmonary:     Effort: Pulmonary effort is normal. No respiratory distress.     Breath sounds: Normal breath sounds. No wheezing or rales.  Musculoskeletal: Normal range of motion.  Lymphadenopathy:     Cervical: No cervical adenopathy.  Skin:    General: Skin is warm and dry.     Findings: No erythema or rash.  Neurological:     Mental Status: She is alert and oriented to person, place, and time.  Psychiatric:        Behavior: Behavior normal.     Comments: Well groomed, good eye contact, normal speech and thoughts        Results for orders placed or performed in visit on 09/05/18  TSH  Result Value Ref Range   TSH 1.11 0.40 - 4.50 mIU/L  Lipid panel  Result Value Ref Range    Cholesterol 258 (H) <200 mg/dL   HDL 53 >50 mg/dL   Triglycerides 81 <150 mg/dL   LDL Cholesterol (Calc) 186 (H) mg/dL (calc)   Total CHOL/HDL Ratio 4.9 <5.0 (calc)   Non-HDL Cholesterol (Calc) 205 (H) <130 mg/dL (calc)  COMPLETE METABOLIC PANEL WITH GFR  Result Value Ref Range   Glucose, Bld 79 65 - 99 mg/dL   BUN 6 (L) 7 - 25 mg/dL   Creat 0.97 (H) 0.60 - 0.93 mg/dL   GFR, Est Non African American 59 (L) > OR = 60 mL/min/1.65m   GFR, Est African American 69 > OR = 60 mL/min/1.76m  BUN/Creatinine Ratio 6 6 - 22 (calc)   Sodium 141 135 - 146 mmol/L   Potassium 3.9 3.5 - 5.3 mmol/L   Chloride 105 98 - 110 mmol/L   CO2 27 20 - 32 mmol/L   Calcium 9.5 8.6 - 10.4 mg/dL   Total Protein 6.9 6.1 - 8.1 g/dL   Albumin 4.1 3.6 - 5.1 g/dL   Globulin 2.8 1.9 - 3.7 g/dL (calc)   AG Ratio 1.5 1.0 - 2.5 (calc)   Total Bilirubin 0.9 0.2 - 1.2 mg/dL   Alkaline phosphatase (APISO) 66 33 - 130 U/L   AST 19 10 - 35 U/L   ALT 9 6 - 29 U/L  CBC with Differential/Platelet  Result Value Ref Range   WBC 8.3 3.8 - 10.8 Thousand/uL   RBC 4.81 3.80 - 5.10 Million/uL   Hemoglobin 12.6 11.7 - 15.5 g/dL   HCT 39.6 35.0 - 45.0 %   MCV 82.3 80.0 - 100.0 fL   MCH 26.2 (L) 27.0 - 33.0 pg   MCHC 31.8 (L) 32.0 - 36.0 g/dL   RDW 13.6 11.0 - 15.0 %   Platelets 364 140 - 400 Thousand/uL   MPV 10.9 7.5 - 12.5 fL   Neutro Abs 5,229 1,500 - 7,800 cells/uL   Lymphs Abs 2,407 850 - 3,900 cells/uL   WBC mixed population 589 200 - 950 cells/uL   Eosinophils Absolute 42 15 - 500 cells/uL   Basophils Absolute 33 0 - 200 cells/uL   Neutrophils Relative % 63 %   Total Lymphocyte 29.0 %   Monocytes Relative 7.1 %   Eosinophils Relative 0.5 %   Basophils Relative 0.4 %  Hemoglobin A1c  Result Value Ref Range   Hgb A1c MFr Bld 5.5 <5.7 % of total Hgb   Mean Plasma Glucose 111 (calc)   eAG (mmol/L) 6.2 (calc)      Assessment & Plan:  Problem List Items Addressed This Visit    Allergic rhinitis due to  allergen   Relevant Medications   fluticasone (FLONASE) 50 MCG/ACT nasal spray   Asthma without status asthmaticus - Primary   Relevant Medications   albuterol (PROVENTIL HFA) 108 (90 Base) MCG/ACT inhaler   Chronic pain   Relevant Medications   gabapentin (NEURONTIN) 300 MG capsule   Hyperlipidemia   Relevant Medications   rosuvastatin (CRESTOR) 20 MG tablet   hydrochlorothiazide (HYDRODIURIL) 25 MG tablet   Hypertension   Relevant Medications   Respir Rate Dev-Bld Press Mon (RESPERATE 1.0) KIT   rosuvastatin (CRESTOR) 20 MG tablet   hydrochlorothiazide (HYDRODIURIL) 25 MG tablet   Restless legs   Relevant Medications   gabapentin (NEURONTIN) 300 MG capsule    Other Visit Diagnoses    Chronic bilateral low back pain without sciatica       Relevant Medications   gabapentin (NEURONTIN) 300 MG capsule      #Back Pain OA/DJD chronic pain, episodic flares Repeat Gabapentin trial, start at higher dose now 365m NIGHTLY, instead of 10102mtitrate, can add extra 3002mose in day or PM if needed Continue conservative care Follow up if needed  #HTN Still elevated and uncontrolled - off due to allergic response - Still concerns for medication non adherence, however she admits to compliance. - Home BP readings - None  No known complications Failed ACEi Lisinopril (Cough), Amlodipine (HA), allergy to Losartan 100m43mb   Plan:  1. Re order HCTZ 25mg45mly 2. Encourage improved lifestyle - low sodium diet, regular exercise 3. Continue monitor BP outside office, bring readings to next visit, if persistently >140/90 or new symptoms notify office sooner  Additionally [facility] Resperate Machine device for biofeedback to help lower her blood pressure / regulate. Given CPT code [id number] home biofeedback device, they said since cost < $1000, machine is about 300-400$ we would submit rx to DME supplier - [facility] and [facility] to get [facility] covered if possible.  - Requesting device as a trial per patient request, since failed most anti-HTN therapy due to allergies, remains uncontrolled.   Switch meds to MedicRebeccared this encounter  Medications  . DISCONTD: rosuvastatin (CRESTOR) 20 MG tablet    Sig: Take 1 tablet (20 mg total) by mouth daily.    Dispense:  90 tablet    Refill:  3  . DISCONTD: albuterol (PROVENTIL HFA) 108 (90 Base) MCG/ACT inhaler    Sig: Inhale 2 puffs into the lungs every 6 (six) hours as needed for wheezing or shortness of breath.    Dispense:  8 g    Refill:  2  . DISCONTD: fluticasone (FLONASE) 50 MCG/ACT nasal spray    Sig: Place 2 sprays into both nostrils daily. Use for 4-6 weeks then stop and use seasonally or as needed.    Dispense:  48 g    Refill:  3  . Respir Rate Dev-Bld Press Mon (RESPERATE 1.0) KIT    Sig: Use daily to help lower blood pressure    Dispense:  1 kit    Refill:  0    DX I10  . fluticasone (FLONASE) 50 MCG/ACT nasal spray    Sig: Place 2 sprays into both nostrils daily. Use for 4-6 weeks then stop and use seasonally or as needed.    Dispense:  48 g  Refill:  3  . albuterol (PROVENTIL HFA) 108 (90 Base) MCG/ACT inhaler    Sig: Inhale 2 puffs into the lungs every 6 (six) hours as needed for wheezing or shortness of breath.    Dispense:  8 g    Refill:  2  . rosuvastatin (CRESTOR) 20 MG tablet    Sig: Take 1 tablet (20 mg total) by mouth daily.    Dispense:  90 tablet    Refill:  3  . gabapentin (NEURONTIN) 300 MG capsule    Sig: Take 1 capsule (300 mg total) by mouth at bedtime. May take an additional pill during day if needed.    Dispense:  90 capsule    Refill:  1  . hydrochlorothiazide (HYDRODIURIL) 25 MG tablet    Sig: Take 1 tablet (25 mg total) by mouth daily.    Dispense:  90 tablet    Refill:  3     Follow up plan: Return in about 3 months (around 08/22/2019) for HTN, Back Pain.    Nobie Putnam, [facility] 05/22/2019, 11:00 AM

## 2019-05-25 ENCOUNTER — Encounter: Payer: Self-pay | Admitting: Family Medicine

## 2019-05-25 NOTE — Progress Notes (Signed)
DME device Resperate Machine for HTN to be faxed to [facility] to see if covered, her insurance would not cover it if it is < $1000 for DME.  Nobie Putnam, [facility] 05/25/2019, 3:15 PM

## 2019-05-28 DIAGNOSIS — J45909 Unspecified asthma, uncomplicated: Secondary | ICD-10-CM | POA: Diagnosis not present

## 2019-06-29 DIAGNOSIS — J45909 Unspecified asthma, uncomplicated: Secondary | ICD-10-CM | POA: Diagnosis not present

## 2019-07-27 DIAGNOSIS — J45909 Unspecified asthma, uncomplicated: Secondary | ICD-10-CM | POA: Diagnosis not present

## 2019-08-18 DIAGNOSIS — J45909 Unspecified asthma, uncomplicated: Secondary | ICD-10-CM | POA: Diagnosis not present

## 2019-08-21 ENCOUNTER — Ambulatory Visit: Payer: Medicare Other | Admitting: Family Medicine

## 2019-09-09 DIAGNOSIS — J45909 Unspecified asthma, uncomplicated: Secondary | ICD-10-CM | POA: Diagnosis not present

## 2019-09-29 DIAGNOSIS — J45909 Unspecified asthma, uncomplicated: Secondary | ICD-10-CM | POA: Diagnosis not present

## 2019-10-16 ENCOUNTER — Other Ambulatory Visit: Payer: Self-pay

## 2019-10-16 NOTE — Patient Outreach (Signed)
[place] [facility]) Care Management  10/16/2019  DENESHA BROUSE [birth year] [id number]   Medication Adherence call to Mrs. Ophelia Charter ,patients phone number is disconnected. Mrs. Galster is showing past due on Rosuvastatin 20 mg under [place].   [place] Management Direct Dial [phone]  Fax [phone] [email or web address]

## 2019-10-21 DIAGNOSIS — J45909 Unspecified asthma, uncomplicated: Secondary | ICD-10-CM | POA: Diagnosis not present

## 2019-10-22 ENCOUNTER — Other Ambulatory Visit: Payer: Self-pay

## 2019-10-22 NOTE — Patient Outreach (Signed)
[name] [facility]) Care Management  10/22/2019  Kirsten [name] Jul 25, 1946 [id number]   Medication Adherence call to Kirsten [name] Telephone call to Patient regarding Medication Adherence unable to reach patient. Kirsten [name] is showing past due on Rosuvastatin 20 mg under [place].   [place] Management Direct Dial [phone]  Fax [phone] [email or web address]

## 2019-11-02 ENCOUNTER — Other Ambulatory Visit: Payer: Self-pay

## 2019-11-02 NOTE — Patient Outreach (Signed)
[place] [facility]) Care Management  11/02/2019  TOCARRA GASSEN 1946/10/18 [id number]   Medication Adherence call to Mrs. Ophelia Charter patients telephone number is disconnected patient is past due on Rosuvastatin 20 mg under [place].   [place] Management Direct Dial [phone]  Fax [phone] [email or web address]

## 2019-11-09 ENCOUNTER — Other Ambulatory Visit: Payer: Self-pay

## 2019-11-09 ENCOUNTER — Ambulatory Visit (INDEPENDENT_AMBULATORY_CARE_PROVIDER_SITE_OTHER): Payer: Medicare Other

## 2019-11-09 DIAGNOSIS — Z23 Encounter for immunization: Secondary | ICD-10-CM

## 2019-11-10 NOTE — Progress Notes (Signed)
Patient had nurse visit.

## 2019-11-16 DIAGNOSIS — J45909 Unspecified asthma, uncomplicated: Secondary | ICD-10-CM | POA: Diagnosis not present

## 2019-11-17 ENCOUNTER — Encounter: Payer: Self-pay | Admitting: Family Medicine

## 2019-11-17 ENCOUNTER — Other Ambulatory Visit: Payer: Self-pay

## 2019-11-17 ENCOUNTER — Ambulatory Visit (INDEPENDENT_AMBULATORY_CARE_PROVIDER_SITE_OTHER): Payer: Medicare Other | Admitting: Family Medicine

## 2019-11-17 DIAGNOSIS — G894 Chronic pain syndrome: Secondary | ICD-10-CM

## 2019-11-17 DIAGNOSIS — G2581 Restless legs syndrome: Secondary | ICD-10-CM | POA: Diagnosis not present

## 2019-11-17 DIAGNOSIS — F3341 Major depressive disorder, recurrent, in partial remission: Secondary | ICD-10-CM

## 2019-11-17 DIAGNOSIS — M545 Low back pain, unspecified: Secondary | ICD-10-CM

## 2019-11-17 DIAGNOSIS — G8929 Other chronic pain: Secondary | ICD-10-CM

## 2019-11-17 DIAGNOSIS — N3946 Mixed incontinence: Secondary | ICD-10-CM

## 2019-11-17 DIAGNOSIS — E782 Mixed hyperlipidemia: Secondary | ICD-10-CM

## 2019-11-17 MED ORDER — GABAPENTIN 300 MG PO CAPS: 300.0000 mg | ORAL_CAPSULE | Freq: Three times a day (TID) | ORAL | 1 refills | Status: DC

## 2019-11-17 MED ORDER — MELOXICAM 15 MG PO TABS: 15.0000 mg | ORAL_TABLET | Freq: Every day | ORAL | 2 refills | Status: DC | PRN

## 2019-11-17 MED ORDER — ROSUVASTATIN CALCIUM 20 MG PO TABS: 20.0000 mg | ORAL_TABLET | Freq: Every day | ORAL | 3 refills | Status: DC
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
## 2019-11-18 ENCOUNTER — Telehealth: Payer: Self-pay | Admitting: Family Medicine

## 2019-11-18 ENCOUNTER — Other Ambulatory Visit: Payer: Self-pay

## 2019-11-18 DIAGNOSIS — I10 Essential (primary) hypertension: Secondary | ICD-10-CM

## 2019-11-18 MED ORDER — HYDROCHLOROTHIAZIDE 25 MG PO TABS: 25.0000 mg | ORAL_TABLET | Freq: Every day | ORAL | 3 refills | Status: DC

## 2019-11-18 NOTE — Telephone Encounter (Signed)
Pt call reqeusting refill on  BP medication  90 day supply

## 2019-12-01 ENCOUNTER — Ambulatory Visit (INDEPENDENT_AMBULATORY_CARE_PROVIDER_SITE_OTHER): Payer: Medicare Other | Admitting: Licensed Clinical Social Worker

## 2019-12-01 DIAGNOSIS — G894 Chronic pain syndrome: Secondary | ICD-10-CM

## 2019-12-01 DIAGNOSIS — E782 Mixed hyperlipidemia: Secondary | ICD-10-CM | POA: Diagnosis not present

## 2019-12-01 DIAGNOSIS — J4520 Mild intermittent asthma, uncomplicated: Secondary | ICD-10-CM

## 2019-12-01 DIAGNOSIS — I10 Essential (primary) hypertension: Secondary | ICD-10-CM | POA: Diagnosis not present

## 2019-12-01 DIAGNOSIS — F339 Major depressive disorder, recurrent, unspecified: Secondary | ICD-10-CM

## 2019-12-01 DIAGNOSIS — R413 Other amnesia: Secondary | ICD-10-CM

## 2019-12-01 NOTE — Chronic Care Management (AMB) (Signed)
Chronic Care Management    Clinical Social Work General Note  12/01/2019 Name: Kirsten [name] MRN: [id number] DOB: [birth year]  Kirsten [name] is a 74 y.o. year old female who is a primary care patient of Kirsten Hauser, DO. The CCM was consulted to assist the patient with Level of Care Concerns.   Kirsten [name] was given information about Chronic Care Management services today including:  1. CCM service includes personalized support from designated clinical staff supervised by her physician, including individualized plan of care and coordination with other care providers 2. 24/7 contact phone numbers for assistance for urgent and routine care needs. 3. Service will only be billed when office clinical staff spend 20 minutes or more in a month to coordinate care. 4. Only one practitioner may furnish and bill the service in a calendar month. 5. The patient may stop CCM services at any time (effective at the end of the month) by phone call to the office staff. 6. The patient will be responsible for cost sharing (co-pay) of up to 20% of the service fee (after annual deductible is met).  Patient agreed to services and verbal consent obtained.   Review of patient status, including review of consultants reports, relevant laboratory and other test results, and collaboration with appropriate care team members and the patient's provider was performed as part of comprehensive patient evaluation and provision of chronic care management services.    SDOH (Social Determinants of Health) screening performed today. See Care Plan Entry related to challenges with: Physical Activity  Outpatient Encounter Medications as of 12/01/2019  Medication Sig  . albuterol (PROVENTIL HFA) 108 (90 Base) MCG/ACT inhaler Inhale 2 puffs into the lungs every 6 (six) hours as needed for wheezing or shortness of breath.  Marland Kitchen aspirin EC 81 MG tablet Take 1 tablet (81 mg total) by mouth daily.  Marland Kitchen conjugated estrogens  (PREMARIN) vaginal cream Place 1 Applicatorful vaginally daily.  . fexofenadine (ALLEGRA) 180 MG tablet Take 1 tablet (180 mg total) by mouth daily.  . fluticasone (FLONASE) 50 MCG/ACT nasal spray Place 2 sprays into both nostrils daily. Use for 4-6 weeks then stop and use seasonally or as needed. (Patient not taking: Reported on 11/17/2019)  . FLUZONE HIGH-DOSE QUADRIVALENT 0.7 ML SUSY   . gabapentin (NEURONTIN) 300 MG capsule Take 1 capsule (300 mg total) by mouth 3 (three) times daily.  . hydrochlorothiazide (HYDRODIURIL) 25 MG tablet Take 1 tablet (25 mg total) by mouth daily.  Marland Kitchen lubiprostone (AMITIZA) 24 MCG capsule Take 24 mcg by mouth 2 (two) times daily with a meal.  . meloxicam (MOBIC) 15 MG tablet Take 1 tablet (15 mg total) by mouth daily as needed for pain.  . mirtazapine (REMERON) 15 MG tablet   . Respir Rate Dev-Bld Press Mon (RESPERATE 1.0) KIT Use daily to help lower blood pressure  . rosuvastatin (CRESTOR) 20 MG tablet Take 1 tablet (20 mg total) by mouth daily.   No facility-administered encounter medications on file as of 12/01/2019.    Goals Addressed    . "I need more help in the home because of my health" (pt-stated)       Current Barriers:  . Financial constraints related to affording caregiver and managing health care cost . Limited social support . Housing barriers . Social Isolation . Limited access to caregiver . Inability to perform ADL's independently . Inability to perform IADL's independently  Clinical Social Work Clinical Goal(s):  Marland Kitchen Over the next 120 days, patient will work  with SW to address concerns related to gaining PCS through [facility] Care/Medicaid. PCS application request sent to PCP on 12/01/19.  Interventions: . Patient interviewed and appropriate assessments performed . Provided patient with information about the PCS enrollment process . Discussed plans with patient for ongoing care management follow up and provided patient with direct  contact information for care management team . Advised patient to keep phone nearby and answer calls from [place], [place] as it may be [facility] . Assisted patient/caregiver with obtaining information about health plan benefits . Provided education and assistance to client regarding Advanced Directives. . Provided education to patient/caregiver regarding level of care options. . Provided education to patient/caregiver about Hospice and/or Palliative Care services . PCS application emailed to PCP today. LCSW will sent in basket message as well.  Patient Self Care Activities:  . Attends all scheduled provider appointments . Calls provider office for new concerns or questions . Lacks social connections  Initial goal documentation     Follow Up Plan: SW will follow up with patient by phone over the next quarter      Kirsten [name], [place], MSW, [name] Phone: [phone]

## 2019-12-02 ENCOUNTER — Encounter: Payer: Self-pay | Admitting: Family Medicine

## 2019-12-02 DIAGNOSIS — N3946 Mixed incontinence: Secondary | ICD-10-CM | POA: Insufficient documentation

## 2019-12-02 DIAGNOSIS — G8929 Other chronic pain: Secondary | ICD-10-CM | POA: Insufficient documentation

## 2019-12-02 DIAGNOSIS — M159 Polyosteoarthritis, unspecified: Secondary | ICD-10-CM | POA: Insufficient documentation

## 2019-12-08 DIAGNOSIS — J45909 Unspecified asthma, uncomplicated: Secondary | ICD-10-CM | POA: Diagnosis not present

## 2019-12-14 ENCOUNTER — Encounter: Payer: Self-pay | Admitting: Urology

## 2019-12-14 ENCOUNTER — Ambulatory Visit: Payer: Medicare Other | Admitting: Urology

## 2019-12-18 ENCOUNTER — Ambulatory Visit: Payer: Medicare Other

## 2019-12-19 ENCOUNTER — Ambulatory Visit: Payer: Medicare Other | Attending: Internal Medicine

## 2019-12-19 DIAGNOSIS — Z23 Encounter for immunization: Secondary | ICD-10-CM | POA: Insufficient documentation

## 2019-12-19 NOTE — Progress Notes (Signed)
   Covid-19 Vaccination Clinic  Name:  Kirsten [name]    MRN: [id number] DOB: 09/28/1946  12/19/2019  Kirsten [name] was observed post Covid-19 immunization for 15 minutes without incidence. She was provided with Vaccine Information Sheet and instruction to access the V-Safe system.   Kirsten [name] was instructed to call 911 with any severe reactions post vaccine: Marland Kitchen Difficulty breathing  . Swelling of your face and throat  . A fast heartbeat  . A bad rash all over your body  . Dizziness and weakness    Immunizations Administered    Name Date Dose VIS Date Route   Pfizer COVID-19 Vaccine 12/19/2019  9:39 AM 0.3 mL 10/16/2019 Intramuscular   Manufacturer: [employer], [employer]   Lot: [id number]   NDC: [address]-6

## 2019-12-22 ENCOUNTER — Ambulatory Visit: Payer: Medicare Other

## 2019-12-24 ENCOUNTER — Ambulatory Visit (INDEPENDENT_AMBULATORY_CARE_PROVIDER_SITE_OTHER): Payer: Medicare Other | Admitting: Licensed Clinical Social Worker

## 2019-12-24 DIAGNOSIS — F3341 Major depressive disorder, recurrent, in partial remission: Secondary | ICD-10-CM | POA: Diagnosis not present

## 2019-12-24 DIAGNOSIS — J4520 Mild intermittent asthma, uncomplicated: Secondary | ICD-10-CM | POA: Diagnosis not present

## 2019-12-24 DIAGNOSIS — E782 Mixed hyperlipidemia: Secondary | ICD-10-CM

## 2019-12-24 DIAGNOSIS — R413 Other amnesia: Secondary | ICD-10-CM

## 2019-12-24 DIAGNOSIS — G894 Chronic pain syndrome: Secondary | ICD-10-CM

## 2019-12-24 NOTE — Chronic Care Management (AMB) (Signed)
Chronic Care Management    Clinical Social Work Follow Up Note  12/24/2019 Name: JHOANA UPHAM MRN: [id number] DOB: 05-Sep-1946  ROCHEL PRIVETT is a 74 y.o. year old female who is a primary care patient of Olin Hauser, DO. The CCM team was consulted for assistance with Caregiver Stress.   Review of patient status, including review of consultants reports, other relevant assessments, and collaboration with appropriate care team members and the patient's provider was performed as part of comprehensive patient evaluation and provision of chronic care management services.    SDOH (Social Determinants of Health) assessments and interventions performed:      Advanced Directives Status: <no information> See Care Plan for related entries.   Outpatient Encounter Medications as of 12/24/2019  Medication Sig  . albuterol (PROVENTIL HFA) 108 (90 Base) MCG/ACT inhaler Inhale 2 puffs into the lungs every 6 (six) hours as needed for wheezing or shortness of breath.  Marland Kitchen aspirin EC 81 MG tablet Take 1 tablet (81 mg total) by mouth daily.  Marland Kitchen conjugated estrogens (PREMARIN) vaginal cream Place 1 Applicatorful vaginally daily.  . fexofenadine (ALLEGRA) 180 MG tablet Take 1 tablet (180 mg total) by mouth daily.  . fluticasone (FLONASE) 50 MCG/ACT nasal spray Place 2 sprays into both nostrils daily. Use for 4-6 weeks then stop and use seasonally or as needed. (Patient not taking: Reported on 11/17/2019)  . FLUZONE HIGH-DOSE QUADRIVALENT 0.7 ML SUSY   . gabapentin (NEURONTIN) 300 MG capsule Take 1 capsule (300 mg total) by mouth 3 (three) times daily.  . hydrochlorothiazide (HYDRODIURIL) 25 MG tablet Take 1 tablet (25 mg total) by mouth daily.  Marland Kitchen lubiprostone (AMITIZA) 24 MCG capsule Take 24 mcg by mouth 2 (two) times daily with a meal.  . meloxicam (MOBIC) 15 MG tablet Take 1 tablet (15 mg total) by mouth daily as needed for pain.  . mirtazapine (REMERON) 15 MG tablet   . Respir Rate Dev-Bld Press  Mon (RESPERATE 1.0) KIT Use daily to help lower blood pressure  . rosuvastatin (CRESTOR) 20 MG tablet Take 1 tablet (20 mg total) by mouth daily.   No facility-administered encounter medications on file as of 12/24/2019.     Goals Addressed    . "I need more help in the home because of my health" (pt-stated)       Current Barriers:  . Financial constraints related to affording caregiver and managing health care cost . Limited social support . Housing barriers . Social Isolation . Limited access to caregiver . Inability to perform ADL's independently . Inability to perform IADL's independently  Clinical Social Work Clinical Goal(s):  Marland Kitchen Over the next 120 days, patient will work with SW to address concerns related to gaining PCS through [facility] Care/Medicaid. PCS application request sent to PCP on 12/01/19.  Interventions: . Patient interviewed and appropriate assessments performed . Provided patient with information about the PCS enrollment process . Discussed plans with patient for ongoing care management follow up and provided patient with direct contact information for care management team . Advised patient to keep phone nearby and answer calls from [place], [place] as it may be [facility] . Assisted patient/caregiver with obtaining information about health plan benefits . Provided education and assistance to client regarding Advanced Directives. . Provided education to patient/caregiver regarding level of care options. . Provided education to patient/caregiver about Hospice and/or Palliative Care services . PCS application emailed to PCP today. LCSW will sent in basket message as well. UPDATE- LCSW  received call from [facility]. They have been unable to reach patient by phone to approve her for services. LCSW completed call to patient and provided information. Patient is agreeable to contact them today.  Patient Self Care Activities:  . Attends all scheduled  provider appointments . Calls provider office for new concerns or questions . Lacks social connections  Please see past updates related to this goal by clicking on the "Past Updates" button in the selected goal      Follow Up Plan: SW will follow up with patient by phone over the next quarter  Eula Fried, [place], MSW, [email or web address] Phone: [phone]

## 2019-12-24 NOTE — Chronic Care Management (AMB) (Signed)
  Care Management   Follow Up Note   12/24/2019 Name: Kirsten [name] MRN: [id number] DOB: 24-Apr-1946  Referred by: Smitty Cords, DO Reason for referral : Care Coordination   Kirsten [name] is a 74 y.o. year old female who is a primary care patient of Smitty Cords, DO. The care management team was consulted for assistance with care management and care coordination needs.    Review of patient status, including review of consultants reports, relevant laboratory and other test results, and collaboration with appropriate care team members and the patient's provider was performed as part of comprehensive patient evaluation and provision of chronic care management services.    LCSW completed CCM outreach attempt today but was unable to reach patient successfully. A HIPPA compliant voice message was left encouraging patient to return call once available. LCSW rescheduled CCM SW appointment as well.  A HIPPA compliant phone message was left for the patient providing contact information and requesting a return call.   Dickie La, BSW, MSW, LCSW [facility]  [facility] [email or web address] Phone: [phone]

## 2019-12-30 DIAGNOSIS — J45909 Unspecified asthma, uncomplicated: Secondary | ICD-10-CM | POA: Diagnosis not present

## 2020-01-09 ENCOUNTER — Ambulatory Visit: Payer: Medicare Other | Attending: Internal Medicine

## 2020-01-09 DIAGNOSIS — Z23 Encounter for immunization: Secondary | ICD-10-CM

## 2020-01-09 NOTE — Progress Notes (Signed)
   Covid-19 Vaccination Clinic  Name:  MARIE BOROWSKI    MRN: [id number] DOB: 1946/04/06  01/09/2020  Ms. Selvey was observed post Covid-19 immunization for 15 minutes without incident. She was provided with Vaccine Information Sheet and instruction to access the V-Safe system.   Ms. Stacey was instructed to call 911 with any severe reactions post vaccine: Marland Kitchen Difficulty breathing  . Swelling of face and throat  . A fast heartbeat  . A bad rash all over body  . Dizziness and weakness   Immunizations Administered    Name Date Dose VIS Date Route   Pfizer COVID-19 Vaccine 01/09/2020  8:58 AM 0.3 mL 10/16/2019 Intramuscular   Manufacturer: [employer], [employer]   Lot: [id number]   NDC: [address]-1

## 2020-01-21 DIAGNOSIS — J45909 Unspecified asthma, uncomplicated: Secondary | ICD-10-CM | POA: Diagnosis not present

## 2020-02-02 ENCOUNTER — Ambulatory Visit (INDEPENDENT_AMBULATORY_CARE_PROVIDER_SITE_OTHER): Payer: Medicare Other | Admitting: Licensed Clinical Social Worker

## 2020-02-02 DIAGNOSIS — G894 Chronic pain syndrome: Secondary | ICD-10-CM

## 2020-02-02 DIAGNOSIS — F339 Major depressive disorder, recurrent, unspecified: Secondary | ICD-10-CM

## 2020-02-02 DIAGNOSIS — F3341 Major depressive disorder, recurrent, in partial remission: Secondary | ICD-10-CM | POA: Diagnosis not present

## 2020-02-02 DIAGNOSIS — J4520 Mild intermittent asthma, uncomplicated: Secondary | ICD-10-CM | POA: Diagnosis not present

## 2020-02-02 DIAGNOSIS — I10 Essential (primary) hypertension: Secondary | ICD-10-CM

## 2020-02-02 NOTE — Chronic Care Management (AMB) (Signed)
Chronic Care Management    Clinical Social Work Follow Up Note  02/02/2020 Name: Kirsten [name] MRN: [id number] DOB: 26-Nov-1945  Kirsten [name] is a 74 y.o. year old female who is a primary care patient of Kirsten Hauser, DO. The CCM team was consulted for assistance with Level of Care Concerns.   Review of patient status, including review of consultants reports, other relevant assessments, and collaboration with appropriate care team members and the patient's provider was performed as part of comprehensive patient evaluation and provision of chronic care management services.    SDOH (Social Determinants of Health) assessments performed: Yes    Outpatient Encounter Medications as of 02/02/2020  Medication Sig  . albuterol (PROVENTIL HFA) 108 (90 Base) MCG/ACT inhaler Inhale 2 puffs into the lungs every 6 (six) hours as needed for wheezing or shortness of breath.  Marland Kitchen aspirin EC 81 MG tablet Take 1 tablet (81 mg total) by mouth daily.  Marland Kitchen conjugated estrogens (PREMARIN) vaginal cream Place 1 Applicatorful vaginally daily.  . fexofenadine (ALLEGRA) 180 MG tablet Take 1 tablet (180 mg total) by mouth daily.  . fluticasone (FLONASE) 50 MCG/ACT nasal spray Place 2 sprays into both nostrils daily. Use for 4-6 weeks then stop and use seasonally or as needed. (Patient not taking: Reported on 11/17/2019)  . FLUZONE HIGH-DOSE QUADRIVALENT 0.7 ML SUSY   . gabapentin (NEURONTIN) 300 MG capsule Take 1 capsule (300 mg total) by mouth 3 (three) times daily.  . hydrochlorothiazide (HYDRODIURIL) 25 MG tablet Take 1 tablet (25 mg total) by mouth daily.  Marland Kitchen lubiprostone (AMITIZA) 24 MCG capsule Take 24 mcg by mouth 2 (two) times daily with a meal.  . meloxicam (MOBIC) 15 MG tablet Take 1 tablet (15 mg total) by mouth daily as needed for pain.  . mirtazapine (REMERON) 15 MG tablet   . Respir Rate Dev-Bld Press Mon (RESPERATE 1.0) KIT Use daily to help lower blood pressure  . rosuvastatin (CRESTOR) 20 MG  tablet Take 1 tablet (20 mg total) by mouth daily.   No facility-administered encounter medications on file as of 02/02/2020.     Goals Addressed    . "I need more help in the home because of my health" (pt-stated)       Current Barriers:  . Financial constraints related to affording caregiver and managing health care cost . Limited social support . Housing barriers . Social Isolation . Limited access to caregiver . Inability to perform ADL's independently . Inability to perform IADL's independently  Clinical Social Work Clinical Goal(s):  Marland Kitchen Over the next 120 days, patient will work with SW to address concerns related to gaining PCS through [facility] Care/Medicaid. PCS application request sent to PCP on 12/01/19 but was declined services.  Interventions: . Patient interviewed and appropriate assessments performed . Provided patient with information about the PCS appeal and enrollment process. LCSW completed call to [facility] and was notified that patient did not meet their criteria requirements for services. Last application was received on 1/28 and was processed on 2/2. Medicaid declined personal care based on their required formula used since patient was deemed able to do-bathing, dressing, toileting, eating. . Patient wishes to reapply for services instead of appealing last request. . Discussed plans with patient for ongoing care management follow up and provided patient with direct contact information for care management team . Assisted patient/caregiver with obtaining information about health plan benefits . Provided education and assistance to client regarding Advanced Directives. . Provided education  to patient/caregiver regarding level of care options. . Provided education to patient/caregiver about Hospice and/or Palliative Care services . Medicaid PCS declined patient for services but she is still in need of in home support and assistance. LCSW will send update to  PCP. Patient is wanting to resend PCS application if PCP is willing.  Patient Self Care Activities:  . Attends all scheduled provider appointments . Calls provider office for new concerns or questions . Lacks social connections  Please see past updates related to this goal by clicking on the "Past Updates" button in the selected goal      Follow Up Plan: SW will follow up with patient by phone over the next quarter  Kirsten [name], [place], MSW, [email or web address] Phone: [phone]

## 2020-02-11 ENCOUNTER — Other Ambulatory Visit: Payer: Self-pay | Admitting: Family Medicine

## 2020-02-11 DIAGNOSIS — G894 Chronic pain syndrome: Secondary | ICD-10-CM

## 2020-02-11 DIAGNOSIS — G8929 Other chronic pain: Secondary | ICD-10-CM

## 2020-02-13 ENCOUNTER — Emergency Department
Admission: EM | Admit: 2020-02-13 | Discharge: 2020-02-13 | Disposition: A | Discharge status: Home / Self Care | Payer: Medicare Other | Attending: Emergency Medicine | Admitting: Emergency Medicine

## 2020-02-13 ENCOUNTER — Other Ambulatory Visit: Payer: Self-pay

## 2020-02-13 ENCOUNTER — Emergency Department: Payer: Medicare Other

## 2020-02-13 ENCOUNTER — Encounter: Payer: Self-pay | Admitting: Emergency Medicine

## 2020-02-13 DIAGNOSIS — R0989 Other specified symptoms and signs involving the circulatory and respiratory systems: Secondary | ICD-10-CM | POA: Diagnosis present

## 2020-02-13 DIAGNOSIS — I252 Old myocardial infarction: Secondary | ICD-10-CM | POA: Diagnosis not present

## 2020-02-13 DIAGNOSIS — R1310 Dysphagia, unspecified: Secondary | ICD-10-CM | POA: Insufficient documentation

## 2020-02-13 DIAGNOSIS — M25511 Pain in right shoulder: Secondary | ICD-10-CM | POA: Diagnosis not present

## 2020-02-13 DIAGNOSIS — Z79899 Other long term (current) drug therapy: Secondary | ICD-10-CM | POA: Insufficient documentation

## 2020-02-13 DIAGNOSIS — J45909 Unspecified asthma, uncomplicated: Secondary | ICD-10-CM | POA: Insufficient documentation

## 2020-02-13 DIAGNOSIS — T189XXA Foreign body of alimentary tract, part unspecified, initial encounter: Secondary | ICD-10-CM | POA: Diagnosis not present

## 2020-02-13 DIAGNOSIS — I10 Essential (primary) hypertension: Secondary | ICD-10-CM | POA: Insufficient documentation

## 2020-02-13 DIAGNOSIS — R1319 Other dysphagia: Secondary | ICD-10-CM

## 2020-02-13 MED ORDER — DIPHENHYDRAMINE HCL 12.5 MG/5ML PO ELIX
12.5000 mg | ORAL_SOLUTION | Freq: Once | ORAL | Status: AC
  Administered 2020-02-13: 12.5 mg via ORAL
  Filled 2020-02-13: qty 5

## 2020-02-13 MED ORDER — METHYLPREDNISOLONE 4 MG PO TBPK: ORAL_TABLET | ORAL | 0 refills | Status: DC

## 2020-02-13 MED ORDER — LIDOCAINE VISCOUS HCL 2 % MT SOLN
5.0000 mL | Freq: Once | OROMUCOSAL | Status: AC
  Administered 2020-02-13: 15:00:00 5 mL via OROMUCOSAL
  Filled 2020-02-13: qty 15

## 2020-02-13 MED ORDER — LIDOCAINE VISCOUS HCL 2 % MT SOLN: 5.0000 mL | Freq: Four times a day (QID) | OROMUCOSAL | 0 refills | Status: DC | PRN

## 2020-02-13 MED ORDER — PREDNISONE 20 MG PO TABS
60.0000 mg | ORAL_TABLET | Freq: Once | ORAL | Status: AC
  Administered 2020-02-13: 15:00:00 60 mg via ORAL
  Filled 2020-02-13: qty 3

## 2020-02-13 NOTE — ED Provider Notes (Signed)
[facility] Emergency Department Provider Note   ____________________________________________   First MD Initiated Contact with Patient 02/13/20 1301     (approximate)  I have reviewed the triage vital signs and the nursing notes.   HISTORY  Chief Complaint Sore Throat and Shoulder Pain    HPI Kirsten [name] is a 74 y.o. female patient presents with foreign body sensation of the throat after eating bacon this morning.  Patient states she is able to maintain her secretions.  Patient denies dyspnea.  Patient rates her pain/discomfort as 10/10.  No palliative measure for complaint.  Patient also complaining of right shoulder pain status post receiving second Covid shot 1 month ago.  Patient denies decreased range of motion loss of sensation.      Past Medical History:  Diagnosis Date  . Allergy   . Arthritis   . Asthma   . Back injury   . Heart disease   . History of blood clots   . History of chicken pox   . Hyperlipidemia   . Hypertension   . Memory loss   . Migraine headache   . Myocardial infarction [facility])     Patient Active Problem List   Diagnosis Date Noted  . Osteoarthritis of multiple joints 12/02/2019  . Chronic low back pain 12/02/2019  . Mixed stress and urge urinary incontinence 12/02/2019  . Recurrent major depression in partial remission ([place]) 09/15/2018  . Allergic rhinitis due to allergen 03/01/2017  . Constipation 12/26/2016  . History of bowel resection 12/26/2016  . HA (headache) 11/18/2015  . Memory loss 11/18/2015  . Migraine headache 11/18/2015  . Osteoarthritis, hip, bilateral 11/18/2015  . Hypertension 09/20/2015  . Hyperlipidemia 09/20/2015  . Chronic pain 09/20/2015  . Heart murmur 09/20/2015  . Asthma without status asthmaticus 09/20/2015  . Post concussion syndrome 11/08/2014  . Restless legs 10/05/2014    Past Surgical History:  Procedure Laterality Date  . ABDOMINAL HYSTERECTOMY    . APPENDECTOMY      . BOWEL RESECTION    . COLONOSCOPY WITH PROPOFOL N/A 06/17/2017   Procedure: COLONOSCOPY WITH PROPOFOL;  Surgeon: Lollie Sails, MD;  Location: [facility] ENDOSCOPY;  Service: Endoscopy;  Laterality: N/A;    Prior to Admission medications   Medication Sig Start Date End Date Taking? Authorizing Provider  albuterol (PROVENTIL HFA) 108 (90 Base) MCG/ACT inhaler Inhale 2 puffs into the lungs every 6 (six) hours as needed for wheezing or shortness of breath. 05/22/19   Karamalegos, Devonne Doughty, DO  aspirin EC 81 MG tablet Take 1 tablet (81 mg total) by mouth daily. 09/20/15   Luciana Axe, NP  conjugated estrogens (PREMARIN) vaginal cream Place 1 Applicatorful vaginally daily. 09/15/18   Karamalegos, Devonne Doughty, DO  fexofenadine (ALLEGRA) 180 MG tablet Take 1 tablet (180 mg total) by mouth daily. 05/09/18   Karamalegos, Devonne Doughty, DO  fluticasone (FLONASE) 50 MCG/ACT nasal spray Place 2 sprays into both nostrils daily. Use for 4-6 weeks then stop and use seasonally or as needed. Patient not taking: Reported on 11/17/2019 05/22/19   Olin Hauser, DO  FLUZONE HIGH-DOSE QUADRIVALENT 0.7 ML SUSY  07/21/19   [provider]  gabapentin (NEURONTIN) 300 MG capsule Take 1 capsule (300 mg total) by mouth 3 (three) times daily. 11/17/19   Karamalegos, Devonne Doughty, DO  hydrochlorothiazide (HYDRODIURIL) 25 MG tablet Take 1 tablet (25 mg total) by mouth daily. 11/18/19   Karamalegos, Devonne Doughty, DO  lubiprostone (AMITIZA) 24 MCG capsule  Take 24 mcg by mouth 2 (two) times daily with a meal.    [provider]  meloxicam (MOBIC) 15 MG tablet TAKE 1 TABLET(15 MG) BY MOUTH DAILY AS NEEDED FOR PAIN 02/11/20   Olin Hauser, DO  mirtazapine (REMERON) 15 MG tablet  07/09/19   [provider]  Respir Rate Dev-Bld Press Mon (RESPERATE 1.0) KIT Use daily to help lower blood pressure 05/22/19   Karamalegos, Alexander J, DO  rosuvastatin (CRESTOR) 20 MG tablet Take 1 tablet (20  mg total) by mouth daily. 11/17/19   Karamalegos, Devonne Doughty, DO    Allergies Amlodipine  Family History  Problem Relation Age of Onset  . Cancer Father        unknown type    Social History Social History   Tobacco Use  . Smoking status: Never Smoker  . Smokeless tobacco: Never Used  Substance Use Topics  . Alcohol use: No    Alcohol/week: 0.0 standard drinks  . Drug use: No    Review of Systems  Constitutional: No fever/chills Eyes: No visual changes. ENT: Sore throat. Cardiovascular: Denies chest pain. Respiratory: Denies shortness of breath. Gastrointestinal: No abdominal pain.  No nausea, no vomiting.  No diarrhea.  No constipation. Genitourinary: Negative for dysuria. Musculoskeletal: Right shoulder pain. Skin: Negative for rash. Neurological: Negative for headaches, focal weakness or numbness. Endocrine:  Hyperlipidemia and hypertension Allergic/Immunilogical: Norvasc ____________________________________________   PHYSICAL EXAM:  VITAL SIGNS: ED Triage Vitals  Enc Vitals Group     BP 02/13/20 1255 135/73     Pulse Rate 02/13/20 1255 68     Resp 02/13/20 1255 18     Temp 02/13/20 1255 98.4 F (36.9 C)     Temp Source 02/13/20 1255 Oral     SpO2 02/13/20 1255 100 %     Weight 02/13/20 1256 112 lb (50.8 kg)     Height 02/13/20 1256 5' (1.524 m)     Head Circumference --      Peak Flow --      Pain Score 02/13/20 1255 10     Pain Loc --      Pain Edu? --      Excl. in [place]? --     Constitutional: Alert and oriented. Well appearing and in no acute distress. Nose: No congestion/rhinnorhea. Mouth/Throat: Mucous membranes are moist.  Oropharynx non-erythematous. Neck: No stridor.   Hematological/Lymphatic/Immunilogical: No cervical lymphadenopathy. Cardiovascular: Normal rate, regular rhythm. Grossly normal heart sounds.  Good peripheral circulation. Respiratory: Normal respiratory effort.  No retractions. Lungs CTAB. Musculoskeletal: No lower  extremity tenderness nor edema.  No joint effusions. Neurologic:  Normal speech and language. No gross focal neurologic deficits are appreciated. No gait instability. Skin:  Skin is warm, dry and intact. No rash noted. Psychiatric: Mood and affect are normal. Speech and behavior are normal.  ____________________________________________   LABS (all labs ordered are listed, but only abnormal results are displayed)  Labs Reviewed - No data to display ____________________________________________  EKG   ____________________________________________  RADIOLOGY  ED MD interpretation:    Official radiology report(s): DG Neck Soft Tissue  Result Date: 02/13/2020 CLINICAL DATA:  Foreign body sensation increased with swallowing. EXAM: NECK SOFT TISSUES - 1+ VIEW COMPARISON:  None. FINDINGS: Prevertebral soft tissues are normal. Pharynx and hypopharynx are normal. The epiglottis and subglottic airway are normal. There is moderate degenerate change of the cervical spine with multilevel disc disease. IMPRESSION: No acute findings. Electronically Signed   By: Marin Olp M.D.  On: 02/13/2020 14:14    ____________________________________________   PROCEDURES  Procedure(s) performed (including Critical Care):  Procedures   ____________________________________________   INITIAL IMPRESSION / ASSESSMENT AND PLAN / ED COURSE  As part of my medical decision making, I reviewed the following data within the [place]   Patient presents with foreign body sensation to the throat after eating this morning.  Discussed negative findings with soft tissue neck.  Patient given discharge care instruction advised take medication as directed.  Patient advised to apply warm compresses to the shoulder and continue anti-inflammatory medications.    RAIVEN BELIZAIRE was evaluated in Emergency Department on 02/13/2020 for the symptoms described in the history of present illness. She was  evaluated in the context of the global COVID-19 pandemic, which necessitated consideration that the patient might be at risk for infection with the SARS-CoV-2 virus that causes COVID-19. Institutional protocols and algorithms that pertain to the evaluation of patients at risk for COVID-19 are in a state of rapid change based on information released by regulatory bodies including the CDC and federal and state organizations. These policies and algorithms were followed during the patient's care in the ED.        ____________________________________________   FINAL CLINICAL IMPRESSION(S) / ED DIAGNOSES  Final diagnoses:  Esophageal dysphagia     ED Discharge Orders    None       Note:  This document was prepared using Dragon voice recognition software and may include unintentional dictation errors.    Sable Feil, PA-C 02/13/20 1436    Delman Kitten, MD 02/13/20 [id number]

## 2020-02-13 NOTE — ED Triage Notes (Signed)
Pt presents to ED via POV, states was eating bacon earlier this morning and had a sensation of something getting stuck. Pt states painful swallowing after waking up, pt states woke up "gagging" and since then has had sore throat. Pt able to maintain her own secretions, speaks with clear voice.   Pt also c/o R shoulder pain after receiving 2nd Covid shot approx 1 month ago. Pt with noted full ROM to R shoulder at this time.

## 2020-02-13 NOTE — Discharge Instructions (Signed)
Follow discharge care instructions take medication as directed.  If condition persists follow-up with the ENT clinic.  Discharge care instructions.

## 2020-02-15 DIAGNOSIS — J45909 Unspecified asthma, uncomplicated: Secondary | ICD-10-CM | POA: Diagnosis not present

## 2020-02-16 ENCOUNTER — Ambulatory Visit (INDEPENDENT_AMBULATORY_CARE_PROVIDER_SITE_OTHER): Payer: Medicare Other | Admitting: Licensed Clinical Social Worker

## 2020-02-16 DIAGNOSIS — F3341 Major depressive disorder, recurrent, in partial remission: Secondary | ICD-10-CM | POA: Diagnosis not present

## 2020-02-16 DIAGNOSIS — I10 Essential (primary) hypertension: Secondary | ICD-10-CM | POA: Diagnosis not present

## 2020-02-16 DIAGNOSIS — E782 Mixed hyperlipidemia: Secondary | ICD-10-CM

## 2020-02-16 DIAGNOSIS — J4520 Mild intermittent asthma, uncomplicated: Secondary | ICD-10-CM | POA: Diagnosis not present

## 2020-02-16 DIAGNOSIS — G894 Chronic pain syndrome: Secondary | ICD-10-CM

## 2020-02-16 NOTE — Chronic Care Management (AMB) (Signed)
Chronic Care Management    Clinical Social Work Follow Up Note  02/16/2020 Name: BREEZIE MICUCCI MRN: [id number] DOB: Oct 16, 1946  TANAYIA WAHLQUIST is a 74 y.o. year old female who is a primary care patient of Olin Hauser, DO. The CCM team was consulted for assistance with Level of Care Concerns.   Review of patient status, including review of consultants reports, other relevant assessments, and collaboration with appropriate care team members and the patient's provider was performed as part of comprehensive patient evaluation and provision of chronic care management services.    SDOH (Social Determinants of Health) assessments performed: Yes    Outpatient Encounter Medications as of 02/16/2020  Medication Sig  . albuterol (PROVENTIL HFA) 108 (90 Base) MCG/ACT inhaler Inhale 2 puffs into the lungs every 6 (six) hours as needed for wheezing or shortness of breath.  Marland Kitchen aspirin EC 81 MG tablet Take 1 tablet (81 mg total) by mouth daily.  Marland Kitchen conjugated estrogens (PREMARIN) vaginal cream Place 1 Applicatorful vaginally daily.  . fexofenadine (ALLEGRA) 180 MG tablet Take 1 tablet (180 mg total) by mouth daily.  . fluticasone (FLONASE) 50 MCG/ACT nasal spray Place 2 sprays into both nostrils daily. Use for 4-6 weeks then stop and use seasonally or as needed. (Patient not taking: Reported on 11/17/2019)  . FLUZONE HIGH-DOSE QUADRIVALENT 0.7 ML SUSY   . gabapentin (NEURONTIN) 300 MG capsule Take 1 capsule (300 mg total) by mouth 3 (three) times daily.  . hydrochlorothiazide (HYDRODIURIL) 25 MG tablet Take 1 tablet (25 mg total) by mouth daily.  Marland Kitchen lidocaine (XYLOCAINE) 2 % solution Use as directed 5 mLs in the mouth or throat every 6 (six) hours as needed for mouth pain.  Marland Kitchen lubiprostone (AMITIZA) 24 MCG capsule Take 24 mcg by mouth 2 (two) times daily with a meal.  . meloxicam (MOBIC) 15 MG tablet TAKE 1 TABLET(15 MG) BY MOUTH DAILY AS NEEDED FOR PAIN  . methylPREDNISolone (MEDROL DOSEPAK) 4  MG TBPK tablet Take Tapered dose as directed starting Sunday morning.  . mirtazapine (REMERON) 15 MG tablet   . Respir Rate Dev-Bld Press Mon (RESPERATE 1.0) KIT Use daily to help lower blood pressure  . rosuvastatin (CRESTOR) 20 MG tablet Take 1 tablet (20 mg total) by mouth daily.   No facility-administered encounter medications on file as of 02/16/2020.     Goals Addressed    . "I need more help in the home because of my health" (pt-stated)       Current Barriers:  . Financial constraints related to affording caregiver and managing health care cost . Limited social support . Housing barriers . Social Isolation . Limited access to caregiver . Inability to perform ADL's independently . Inability to perform IADL's independently  Clinical Social Work Clinical Goal(s):  Marland Kitchen Over the next 120 days, patient will work with SW to address concerns related to gaining PCS through [facility] Care/Medicaid. PCS application request sent to PCP on 12/01/19 but was declined services.  Interventions: . Patient interviewed and appropriate assessments performed . Provided patient with information about the PCS appeal and enrollment process. LCSW completed call to [facility] and was notified that patient did not meet their criteria requirements for services. Last application was received on 1/28 and was processed on 2/2. Medicaid declined personal care based on their required formula used since patient was deemed able to do-bathing, dressing, toileting, eating. . Patient wishes to reapply for services instead of appealing last request. . Discussed plans with  patient for ongoing care management follow up and provided patient with direct contact information for care management team . Assisted patient/caregiver with obtaining information about health plan benefits . Provided education and assistance to client regarding Advanced Directives. . Provided education to patient/caregiver regarding level of  care options. . Provided education to patient/caregiver about Hospice and/or Palliative Care services . Medicaid PCS declined patient for services but she is still in need of in home support and assistance. PCP sent new PCS request to [facility] on 02/02/20. Patient reports that she has heard from them and completed evaluation. It was self reported that she was approved for services but LCSW asked that patient remain in close contact with [facility] just in case. Patient was encouraged to contact [facility] today in order to choose a PCS (home care provider). Patient is agreeable to do so.   Patient Self Care Activities:  . Attends all scheduled provider appointments . Calls provider office for new concerns or questions . Lacks social connections  Please see past updates related to this goal by clicking on the "Past Updates" button in the selected goal      Follow Up Plan: SW will follow up with patient by phone over the next quarter  Eula Fried, [place], MSW, [email or web address] Phone: [phone]

## 2020-03-17 DIAGNOSIS — J45909 Unspecified asthma, uncomplicated: Secondary | ICD-10-CM | POA: Diagnosis not present

## 2020-03-22 ENCOUNTER — Ambulatory Visit (INDEPENDENT_AMBULATORY_CARE_PROVIDER_SITE_OTHER): Payer: Medicare Other | Admitting: Licensed Clinical Social Worker

## 2020-03-22 DIAGNOSIS — E782 Mixed hyperlipidemia: Secondary | ICD-10-CM

## 2020-03-22 DIAGNOSIS — I10 Essential (primary) hypertension: Secondary | ICD-10-CM | POA: Diagnosis not present

## 2020-03-22 DIAGNOSIS — J4520 Mild intermittent asthma, uncomplicated: Secondary | ICD-10-CM | POA: Diagnosis not present

## 2020-03-22 DIAGNOSIS — F3341 Major depressive disorder, recurrent, in partial remission: Secondary | ICD-10-CM | POA: Diagnosis not present

## 2020-03-22 NOTE — Chronic Care Management (AMB) (Signed)
Chronic Care Management    Clinical Social Work Follow Up Note  03/22/2020 Name: Kirsten [name] MRN: [id number] DOB: [birth year]  Kirsten [name] is a 74 y.o. year old female who is a primary care patient of Olin Hauser, DO. The CCM team was consulted for assistance with [employer]  and Level of Care Concerns.   Review of patient status, including review of consultants reports, other relevant assessments, and collaboration with appropriate care team members and the patient's provider was performed as part of comprehensive patient evaluation and provision of chronic care management services.    SDOH (Social Determinants of Health) assessments performed: Yes    Outpatient Encounter Medications as of 03/22/2020  Medication Sig  . albuterol (PROVENTIL HFA) 108 (90 Base) MCG/ACT inhaler Inhale 2 puffs into the lungs every 6 (six) hours as needed for wheezing or shortness of breath.  Marland Kitchen aspirin EC 81 MG tablet Take 1 tablet (81 mg total) by mouth daily.  Marland Kitchen conjugated estrogens (PREMARIN) vaginal cream Place 1 Applicatorful vaginally daily.  . fexofenadine (ALLEGRA) 180 MG tablet Take 1 tablet (180 mg total) by mouth daily.  . fluticasone (FLONASE) 50 MCG/ACT nasal spray Place 2 sprays into both nostrils daily. Use for 4-6 weeks then stop and use seasonally or as needed. (Patient not taking: Reported on 11/17/2019)  . FLUZONE HIGH-DOSE QUADRIVALENT 0.7 ML SUSY   . gabapentin (NEURONTIN) 300 MG capsule Take 1 capsule (300 mg total) by mouth 3 (three) times daily.  . hydrochlorothiazide (HYDRODIURIL) 25 MG tablet Take 1 tablet (25 mg total) by mouth daily.  Marland Kitchen lidocaine (XYLOCAINE) 2 % solution Use as directed 5 mLs in the mouth or throat every 6 (six) hours as needed for mouth pain.  Marland Kitchen lubiprostone (AMITIZA) 24 MCG capsule Take 24 mcg by mouth 2 (two) times daily with a meal.  . meloxicam (MOBIC) 15 MG tablet TAKE 1 TABLET(15 MG) BY MOUTH DAILY AS NEEDED FOR PAIN  .  methylPREDNISolone (MEDROL DOSEPAK) 4 MG TBPK tablet Take Tapered dose as directed starting Sunday morning.  . mirtazapine (REMERON) 15 MG tablet   . Respir Rate Dev-Bld Press Mon (RESPERATE 1.0) KIT Use daily to help lower blood pressure  . rosuvastatin (CRESTOR) 20 MG tablet Take 1 tablet (20 mg total) by mouth daily.   No facility-administered encounter medications on file as of 03/22/2020.     Goals Addressed    . "I need more help in the home because of my health" (pt-stated)       Current Barriers:  . Financial constraints related to affording caregiver and managing health care cost . Limited social support . Housing barriers . Social Isolation . Limited access to caregiver . Inability to perform ADL's independently . Inability to perform IADL's independently  Clinical Social Work Clinical Goal(s):  Marland Kitchen Over the next 120 days, patient will work with SW to address concerns related to gaining PCS through [facility] Care/Medicaid. PCS application request sent to PCP on 12/01/19 but was declined services.  Interventions: . Patient interviewed and appropriate assessments performed . Provided patient with information about the PCS appeal and enrollment process. LCSW completed call to [facility] and was notified that patient did not meet their criteria requirements for services. Last application was received on 1/28 and was processed on 2/2. Medicaid declined personal care based on their required formula used since patient was deemed able to do-bathing, dressing, toileting, eating. PCP sent new PCS request to [facility] on 02/02/20. Patient  reports that she has heard from them and completed evaluation. It was self reported that she was approved for services but LCSW asked that patient remain in close contact with [facility] just in case. Patient reports that she has chosen [facility] as her PCS agency. Patient reports that she has been in touch with  [facility] today and is hopeful that they will start services soon. . Patient was advised to contact CCM LCSW if PCS services does not start soon . Patient wishes to reapply for services instead of appealing last request. . Discussed plans with patient for ongoing care management follow up and provided patient with direct contact information for care management team . Assisted patient/caregiver with obtaining information about health plan benefits . Provided education and assistance to client regarding Advanced Directives. . Provided education to patient/caregiver regarding level of care options. . Provided education to patient/caregiver about Hospice and/or Palliative Care services . LCSW provided healthy self-care and stress management coping skill education to patient today  Patient Self Care Activities:  . Attends all scheduled provider appointments . Calls provider office for new concerns or questions . Lacks social connections  Please see past updates related to this goal by clicking on the "Past Updates" button in the selected goal      Follow Up Plan: SW will follow up with patient by phone over the next quarter  Eula Fried, [place], MSW, [email or web address] Phone: [phone]

## 2020-03-29 ENCOUNTER — Ambulatory Visit (INDEPENDENT_AMBULATORY_CARE_PROVIDER_SITE_OTHER): Payer: Medicare Other

## 2020-03-29 ENCOUNTER — Other Ambulatory Visit: Payer: Self-pay | Admitting: Family Medicine

## 2020-03-29 DIAGNOSIS — Z0000 Encounter for general adult medical examination without abnormal findings: Secondary | ICD-10-CM | POA: Diagnosis not present

## 2020-03-29 DIAGNOSIS — J4520 Mild intermittent asthma, uncomplicated: Secondary | ICD-10-CM

## 2020-03-29 MED ORDER — ALBUTEROL SULFATE HFA 108 (90 BASE) MCG/ACT IN AERS: 2.0000 | INHALATION_SPRAY | Freq: Four times a day (QID) | RESPIRATORY_TRACT | 2 refills | Status: DC | PRN

## 2020-03-29 NOTE — Patient Instructions (Signed)
Kirsten [name] , Thank you for taking time to come for your Medicare Wellness Visit. I appreciate your ongoing commitment to your health goals. Please review the following plan we discussed and let me know if I can assist you in the future.   Screening recommendations/referrals: Colonoscopy: no longer required  Mammogram: declined  Bone Density: up to date  Recommended yearly ophthalmology/optometry visit for glaucoma screening and checkup Recommended yearly dental visit for hygiene and checkup  Vaccinations: Influenza vaccine: up to date, due 07/2020 Pneumococcal vaccine: completed series  Tdap vaccine: up to date  Shingles vaccine: shingrix completed    Covid-19:completed series   Advanced directives: Advance directive discussed with you today. Once this is complete please bring a copy in to our office so we can scan it into your chart.  Conditions/risks identified: Discussed blood pressure medications today, will discuss with Dr.Karamalegos for further review.   Next appointment: Follow up in one year for your annual wellness visit.    Preventive Care 25 Years and Older, Female Preventive care refers to lifestyle choices and visits with your health care provider that can promote health and wellness. What does preventive care include?  A yearly physical exam. This is also called an annual well check.  Dental exams once or twice a year.  Routine eye exams. Ask your health care provider how often you should have your eyes checked.  Personal lifestyle choices, including:  Daily care of your teeth and gums.  Regular physical activity.  Eating a healthy diet.  Avoiding tobacco and drug use.  Limiting alcohol use.  Practicing safe sex.  Taking low-dose aspirin every day.  Taking vitamin and mineral supplements as recommended by your health care provider. What happens during an annual well check? The services and screenings done by your health care provider during your  annual well check will depend on your age, overall health, lifestyle risk factors, and family history of disease. Counseling  Your health care provider may ask you questions about your:  Alcohol use.  Tobacco use.  Drug use.  Emotional well-being.  Home and relationship well-being.  Sexual activity.  Eating habits.  History of falls.  Memory and ability to understand (cognition).  Work and work Astronomer.  Reproductive health. Screening  You may have the following tests or measurements:  Height, weight, and BMI.  Blood pressure.  Lipid and cholesterol levels. These may be checked every 5 years, or more frequently if you are over 47 years old.  Skin check.  Lung cancer screening. You may have this screening every year starting at age 29 if you have a 30-pack-year history of smoking and currently smoke or have quit within the past 15 years.  Fecal occult blood test (FOBT) of the stool. You may have this test every year starting at age 60.  Flexible sigmoidoscopy or colonoscopy. You may have a sigmoidoscopy every 5 years or a colonoscopy every 10 years starting at age 3.  Hepatitis C blood test.  Hepatitis B blood test.  Sexually transmitted disease (STD) testing.  Diabetes screening. This is done by checking your blood sugar (glucose) after you have not eaten for a while (fasting). You may have this done every 1-3 years.  Bone density scan. This is done to screen for osteoporosis. You may have this done starting at age 43.  Mammogram. This may be done every 1-2 years. Talk to your health care provider about how often you should have regular mammograms. Talk with your health care provider about  your test results, treatment options, and if necessary, the need for more tests. Vaccines  Your health care provider may recommend certain vaccines, such as:  Influenza vaccine. This is recommended every year.  Tetanus, diphtheria, and acellular pertussis (Tdap, Td)  vaccine. You may need a Td booster every 10 years.  Zoster vaccine. You may need this after age 108.  Pneumococcal 13-valent conjugate (PCV13) vaccine. One dose is recommended after age 70.  Pneumococcal polysaccharide (PPSV23) vaccine. One dose is recommended after age 16. Talk to your health care provider about which screenings and vaccines you need and how often you need them. This information is not intended to replace advice given to you by your health care provider. Make sure you discuss any questions you have with your health care provider. Document Released: 11/18/2015 Document Revised: 07/11/2016 Document Reviewed: 08/23/2015 Elsevier Interactive Patient Education  2017 [place] Prevention in the Home Falls can cause injuries. They can happen to people of all ages. There are many things you can do to make your home safe and to help prevent falls. What can I do on the outside of my home?  Regularly fix the edges of walkways and driveways and fix any cracks.  Remove anything that might make you trip as you walk through a door, such as a raised step or threshold.  Trim any bushes or trees on the path to your home.  Use bright outdoor lighting.  Clear any walking paths of anything that might make someone trip, such as rocks or tools.  Regularly check to see if handrails are loose or broken. Make sure that both sides of any steps have handrails.  Any raised decks and porches should have guardrails on the edges.  Have any leaves, snow, or ice cleared regularly.  Use sand or salt on walking paths during winter.  Clean up any spills in your garage right away. This includes oil or grease spills. What can I do in the bathroom?  Use night lights.  Install grab bars by the toilet and in the tub and shower. Do not use towel bars as grab bars.  Use non-skid mats or decals in the tub or shower.  If you need to sit down in the shower, use a plastic, non-slip stool.   Keep the floor dry. Clean up any water that spills on the floor as soon as it happens.  Remove soap buildup in the tub or shower regularly.  Attach bath mats securely with double-sided non-slip rug tape.  Do not have throw rugs and other things on the floor that can make you trip. What can I do in the bedroom?  Use night lights.  Make sure that you have a light by your bed that is easy to reach.  Do not use any sheets or blankets that are too big for your bed. They should not hang down onto the floor.  Have a firm chair that has side arms. You can use this for support while you get dressed.  Do not have throw rugs and other things on the floor that can make you trip. What can I do in the kitchen?  Clean up any spills right away.  Avoid walking on wet floors.  Keep items that you use a lot in easy-to-reach places.  If you need to reach something above you, use a strong step stool that has a grab bar.  Keep electrical cords out of the way.  Do not use floor polish or wax  that makes floors slippery. If you must use wax, use non-skid floor wax.  Do not have throw rugs and other things on the floor that can make you trip. What can I do with my stairs?  Do not leave any items on the stairs.  Make sure that there are handrails on both sides of the stairs and use them. Fix handrails that are broken or loose. Make sure that handrails are as long as the stairways.  Check any carpeting to make sure that it is firmly attached to the stairs. Fix any carpet that is loose or worn.  Avoid having throw rugs at the top or bottom of the stairs. If you do have throw rugs, attach them to the floor with carpet tape.  Make sure that you have a light switch at the top of the stairs and the bottom of the stairs. If you do not have them, ask someone to add them for you. What else can I do to help prevent falls?  Wear shoes that:  Do not have high heels.  Have rubber bottoms.  Are  comfortable and fit you well.  Are closed at the toe. Do not wear sandals.  If you use a stepladder:  Make sure that it is fully opened. Do not climb a closed stepladder.  Make sure that both sides of the stepladder are locked into place.  Ask someone to hold it for you, if possible.  Clearly mark and make sure that you can see:  Any grab bars or handrails.  First and last steps.  Where the edge of each step is.  Use tools that help you move around (mobility aids) if they are needed. These include:  Canes.  Walkers.  Scooters.  Crutches.  Turn on the lights when you go into a dark area. Replace any light bulbs as soon as they burn out.  Set up your furniture so you have a clear path. Avoid moving your furniture around.  If any of your floors are uneven, fix them.  If there are any pets around you, be aware of where they are.  Review your medicines with your doctor. Some medicines can make you feel dizzy. This can increase your chance of falling. Ask your doctor what other things that you can do to help prevent falls. This information is not intended to replace advice given to you by your health care provider. Make sure you discuss any questions you have with your health care provider. Document Released: 08/18/2009 Document Revised: 03/29/2016 Document Reviewed: 11/26/2014 Elsevier Interactive Patient Education  2017 [employer].

## 2020-03-29 NOTE — Progress Notes (Signed)
Subjective:   Kirsten [name] is a 74 y.o. female who presents for Medicare Annual (Subsequent) preventive examination.  I connected with  To Iyania Phillps day by telephone and verified that I am speaking with the correct person using two identifiers. Location patient: home Location provider: work Persons participating in the virtual visit: patient, provider.   I discussed the limitations, risks, security and privacy concerns of performing an evaluation and management service by telephone and the availability of in person appointments. I also discussed with the patient that there may be a patient responsible charge related to this service. The patient expressed understanding and verbally consented to this telephonic visit.    Interactive audio and video telecommunications were attempted between this provider and patient, however failed, due to patient having technical difficulties OR patient did not have access to video capability.  We continued and completed visit with audio only.  Some vital signs may be absent or patient reported.   Time Spent with patient on telephone encounter: 22mnutes   Review of Systems:   Cardiac Risk Factors include: advanced age (>581m, >6>46omen);dyslipidemia;hypertension     Objective:     Vitals: There were no vitals taken for this visit.  There is no height or weight on file to calculate BMI.  Advanced Directives 03/29/2020 02/13/2020 04/15/2018 10/03/2017 09/30/2017 06/17/2017 04/09/2017  Does Patient Have a Medical Advance Directive? No No No No No No No;Yes  Would patient like information on creating a medical advance directive? Yes (MAU/Ambulatory/Procedural Areas - Information given) No - Patient declined No - Patient declined No - Patient declined No - Patient declined No - Patient declined -    Tobacco Social History   Tobacco Use  Smoking Status Never Smoker  Smokeless Tobacco Never Used     Counseling given: Not Answered   Clinical  Intake:  Pre-visit preparation completed: Yes  Pain : 0-10 Pain Score: 5  Pain Type: Chronic pain Pain Location: Generalized Pain Descriptors / Indicators: Aching Pain Onset: More than a month ago Pain Frequency: Constant     Nutritional Risks: None Diabetes: No  How often do you need to have someone help you when you read instructions, pamphlets, or other written materials from your doctor or pharmacy?: 1 - Never  Interpreter Needed?: No  Information entered by :: Bayan Kushnir,LPN  Past Medical History:  Diagnosis Date  . Allergy   . Arthritis   . Asthma   . Back injury   . Heart disease   . History of blood clots   . History of chicken pox   . Hyperlipidemia   . Hypertension   . Memory loss   . Migraine headache   [facility]   Past Surgical History:  Procedure Laterality Date  . ABDOMINAL HYSTERECTOMY    . APPENDECTOMY    . BOWEL RESECTION    . COLONOSCOPY WITH PROPOFOL N/A 06/17/2017   Procedure: COLONOSCOPY WITH PROPOFOL;  Surgeon: SkLollie SailsMD;  [facility];  Service: Endoscopy;  Laterality: N/A;   Family History  Problem Relation Age of Onset  . Cancer Father        unknown type   Social History   Socioeconomic History  . Marital status: Widowed    Spouse name: Not on file  . Number of children: Not on file  . Years of education: Not on file  . Highest education level: Not on file  Occupational History  . Not on file  Tobacco  Use  . Smoking status: Never Smoker  . Smokeless tobacco: Never Used  Substance and Sexual Activity  . Alcohol use: No    Alcohol/week: 0.0 standard drinks  . Drug use: No  . Sexual activity: Not on file  Other Topics Concern  . Not on file  Social History Narrative  . Not on file   Social Determinants of Health   Financial Resource Strain:   . Difficulty of Paying Living Expenses:   Food Insecurity: No Food Insecurity  . Worried About Charity fundraiser in the Last  Year: Never true  . Ran Out of Food in the Last Year: Never true  Transportation Needs: No Transportation Needs  . Lack of Transportation (Medical): No  . Lack of Transportation (Non-Medical): No  Physical Activity: Inactive  . Days of Exercise per Week: 0 days  . Minutes of Exercise per Session: 0 min  Stress:   . Feeling of Stress :   Social Connections: Somewhat Isolated  . Frequency of Communication with Friends and Family: More than three times a week  . Frequency of Social Gatherings with Friends and Family: More than three times a week  . Attends Religious Services: More than 4 times per year  . Active Member of Clubs or Organizations: No  . Attends Archivist Meetings: Never  . Marital Status: Widowed    Outpatient Encounter Medications as of 03/29/2020  Medication Sig  . albuterol (PROVENTIL HFA) 108 (90 Base) MCG/ACT inhaler Inhale 2 puffs into the lungs every 6 (six) hours as needed for wheezing or shortness of breath.  Marland Kitchen amLODipine-benazepril (LOTREL) 10-20 MG capsule Take 1 capsule by mouth daily.  Marland Kitchen aspirin EC 81 MG tablet Take 1 tablet (81 mg total) by mouth daily.  . fexofenadine (ALLEGRA) 180 MG tablet Take 1 tablet (180 mg total) by mouth daily.  . fluticasone (FLONASE) 50 MCG/ACT nasal spray Place 2 sprays into both nostrils daily. Use for 4-6 weeks then stop and use seasonally or as needed.  . lubiprostone (AMITIZA) 24 MCG capsule Take 24 mcg by mouth 2 (two) times daily with a meal.  . meloxicam (MOBIC) 15 MG tablet TAKE 1 TABLET(15 MG) BY MOUTH DAILY AS NEEDED FOR PAIN  . mirtazapine (REMERON) 15 MG tablet   . Respir Rate Dev-Bld Press Mon (RESPERATE 1.0) KIT Use daily to help lower blood pressure  . rosuvastatin (CRESTOR) 20 MG tablet Take 1 tablet (20 mg total) by mouth daily.  Marland Kitchen conjugated estrogens (PREMARIN) vaginal cream Place 1 Applicatorful vaginally daily. (Patient not taking: Reported on 03/29/2020)  . gabapentin (NEURONTIN) 300 MG capsule Take  1 capsule (300 mg total) by mouth 3 (three) times daily. (Patient not taking: Reported on 03/29/2020)  . [DISCONTINUED] FLUZONE HIGH-DOSE QUADRIVALENT 0.7 ML SUSY   . [DISCONTINUED] hydrochlorothiazide (HYDRODIURIL) 25 MG tablet Take 1 tablet (25 mg total) by mouth daily. (Patient not taking: Reported on 03/29/2020)  . [DISCONTINUED] lidocaine (XYLOCAINE) 2 % solution Use as directed 5 mLs in the mouth or throat every 6 (six) hours as needed for mouth pain.  . [DISCONTINUED] methylPREDNISolone (MEDROL DOSEPAK) 4 MG TBPK tablet Take Tapered dose as directed starting Sunday morning. (Patient not taking: Reported on 03/29/2020)   No facility-administered encounter medications on file as of 03/29/2020.    Activities of Daily Living In your present state of health, do you have any difficulty performing the following activities: 03/29/2020 05/22/2019  Hearing? N N  Comment no hearing aids -  Vision? N  Y  Comment [facility], eyeglasses -  Difficulty concentrating or making decisions? N N  Walking or climbing stairs? Y N  Dressing or bathing? N N  Doing errands, shopping? N N  Preparing Food and eating ? N -  Using the Toilet? N -  In the past six months, have you accidently leaked urine? Y -  Do you have problems with loss of bowel control? N -  Managing your Medications? N -  Managing your Finances? N -  Housekeeping or managing your Housekeeping? N -  Some recent data might be hidden    Patient Care Team: Olin Hauser, DO as PCP - General (Family Medicine) Greg Cutter, LCSW as Social Worker (Licensed Clinical Social Worker)    Assessment:   This is a routine wellness examination for [facility].  Exercise Activities and Dietary recommendations Current Exercise Habits: The patient does not participate in regular exercise at present, Exercise limited by: None identified  Goals Addressed   None     Fall Risk: Fall Risk  03/29/2020 05/22/2019 07/14/2018 05/09/2018 04/15/2018    Falls in the past year? 0 0 No No No  Number falls in past yr: 0 0 - - -  Injury with Fall? 0 - - - -    FALL RISK PREVENTION PERTAINING TO THE HOME:  Any stairs in or around the home? No  If so, are there any without handrails? No   Home free of loose throw rugs in walkways, pet beds, electrical cords, etc? Yes  Adequate lighting in your home to reduce risk of falls? Yes   ASSISTIVE DEVICES UTILIZED TO PREVENT FALLS:  Life alert? No  Use of a cane, walker or w/c? Yes  Grab bars in the bathroom? Yes  Shower chair or bench in shower? Yes  Elevated toilet seat or a handicapped toilet? Yes   DME ORDERS:  DME order needed?  No   TIMED UP AND GO:  Unable to perform    Depression Screen PHQ 2/9 Scores 03/29/2020 11/17/2019 05/22/2019 09/15/2018  PHQ - 2 Score 1 0 3 4  PHQ- 9 Score - '5 9 20     ' Cognitive Function     6CIT Screen 04/15/2018 04/09/2017  What Year? 0 points 0 points  What month? 0 points 0 points  What time? 0 points 0 points  Count back from 20 0 points 0 points  Months in reverse 0 points 4 points  Repeat phrase 0 points 6 points  Total Score 0 10    Immunization History  Administered Date(s) Administered  . Fluad Quad(high Dose 65+) 11/09/2019  . Influenza, High Dose Seasonal PF 09/20/2015, 09/15/2018, 07/21/2019  . Influenza-Unspecified 08/23/2017, 12/10/2017  . PFIZER SARS-COV-2 Vaccination 12/19/2019, 01/09/2020  . Pneumococcal Conjugate-13 08/16/2014  . Pneumococcal Polysaccharide-23 02/10/2014  . Tdap 02/10/2014  . Zoster Recombinat (Shingrix) 11/09/2019    Qualifies for Shingles Vaccine? Shingrix completed   Tdap: up to date   Flu Vaccine: up to date   Pneumococcal Vaccine: up to date   Covid-19 Vaccine: Completed vaccines  Screening Tests Health Maintenance  Topic Date Due  . MAMMOGRAM  09/19/2012  . INFLUENZA VACCINE  06/05/2020  . TETANUS/TDAP  09/05/2024  . COLONOSCOPY  06/18/2027  . DEXA SCAN  Completed  . COVID-19  Vaccine  Completed  . Hepatitis C Screening  Completed  . PNA vac Low Risk Adult  Completed    Cancer Screenings:  Colorectal Screening: Completed 2018. No longer required  Mammogram: declined  Bone Density: Completed 2015.   Lung Cancer Screening: (Low Dose CT Chest recommended if Age 83-80 years, 30 pack-year currently smoking OR have quit w/in 15years.) does not qualify.     Additional Screening:  Hepatitis C Screening: does qualify; Completed 2016  Vision Screening: Recommended annual ophthalmology exams for early detection of glaucoma and other disorders of the eye. Is the patient up to date with their annual eye exam?  Yes  Who is the provider or what is the name of the office in which the pt attends annual eye exams? [facility]   Dental Screening: Recommended annual dental exams for proper oral hygiene  Community Resource Referral:  CRR required this visit?  No       Plan:  I have personally reviewed and addressed the Medicare Annual Wellness questionnaire and have noted the following in the patient's chart:  A. Medical and social history B. Use of alcohol, tobacco or illicit drugs  C. Current medications and supplements D. Functional ability and status E.  Nutritional status F.  Physical activity G. Advance directives H. List of other physicians I.  Hospitalizations, surgeries, and ER visits in previous 12 months J.  [place] such as hearing and vision if needed, cognitive and depression L. Referrals and appointments   In addition, I have reviewed and discussed with patient certain preventive protocols, quality metrics, and best practice recommendations. A written personalized care plan for preventive services as well as general preventive health recommendations were provided to patient.  Due to this being a telephonic visit, the after visit summary with patients personalized plan was offered to patient via mail or my-chart.  Patient was  mailed a copy of AVS.    Signed,    Bevelyn Ngo, LPN  1/61/0960 Nurse Health Advisor   Nurse Notes:   -Patient states she stopped taking her hydrochlorathizide 2 months ago because it was causing facial swelling and pain. States she switched back to her old BP medication that she had when she first started coming to [facility]. Patient was unable to read label on the bottle. States the writing on the bottle was no longer legible. Patient described the pill as a purple pill with writing of LU [id number]. Looked up via description and came up amlodipine-benazpril last prescribed in 2016 per medication history in chart.  Added hydrochlorithzide to allergy list. Please advise for further advise on BP medication.   - needs refill on albuterol inhaler at [facility], it was previously at [place].  -Scheduled a physical for patient for June 9th as she states she is needing refills on a lot of her medications, was do for a follow up in March and states it was hard to come in then but can come in in 2 weeks.

## 2020-04-07 DIAGNOSIS — J45909 Unspecified asthma, uncomplicated: Secondary | ICD-10-CM | POA: Diagnosis not present

## 2020-04-13 ENCOUNTER — Ambulatory Visit (INDEPENDENT_AMBULATORY_CARE_PROVIDER_SITE_OTHER): Payer: Medicare Other | Admitting: Family Medicine

## 2020-04-13 ENCOUNTER — Encounter: Payer: Self-pay | Admitting: Family Medicine

## 2020-04-13 ENCOUNTER — Other Ambulatory Visit: Payer: Self-pay

## 2020-04-13 VITALS — BP 138/74 | HR 63 | Temp 96.8°F | Resp 16 | Ht 60.0 in | Wt 121.6 lb

## 2020-04-13 DIAGNOSIS — M545 Low back pain: Secondary | ICD-10-CM

## 2020-04-13 DIAGNOSIS — J4520 Mild intermittent asthma, uncomplicated: Secondary | ICD-10-CM | POA: Diagnosis not present

## 2020-04-13 DIAGNOSIS — Z0000 Encounter for general adult medical examination without abnormal findings: Secondary | ICD-10-CM

## 2020-04-13 DIAGNOSIS — G894 Chronic pain syndrome: Secondary | ICD-10-CM

## 2020-04-13 DIAGNOSIS — G2581 Restless legs syndrome: Secondary | ICD-10-CM | POA: Diagnosis not present

## 2020-04-13 DIAGNOSIS — M8949 Other hypertrophic osteoarthropathy, multiple sites: Secondary | ICD-10-CM | POA: Diagnosis not present

## 2020-04-13 DIAGNOSIS — G8929 Other chronic pain: Secondary | ICD-10-CM

## 2020-04-13 DIAGNOSIS — E782 Mixed hyperlipidemia: Secondary | ICD-10-CM

## 2020-04-13 DIAGNOSIS — I10 Essential (primary) hypertension: Secondary | ICD-10-CM

## 2020-04-13 DIAGNOSIS — F3342 Major depressive disorder, recurrent, in full remission: Secondary | ICD-10-CM

## 2020-04-13 DIAGNOSIS — J3089 Other allergic rhinitis: Secondary | ICD-10-CM

## 2020-04-13 DIAGNOSIS — R7309 Other abnormal glucose: Secondary | ICD-10-CM

## 2020-04-13 DIAGNOSIS — M159 Polyosteoarthritis, unspecified: Secondary | ICD-10-CM

## 2020-04-13 MED ORDER — AMLODIPINE BESY-BENAZEPRIL HCL 10-20 MG PO CAPS: 1.0000 | ORAL_CAPSULE | Freq: Every day | ORAL | 3 refills | Status: DC

## 2020-04-13 MED ORDER — GABAPENTIN 300 MG PO CAPS: 300.0000 mg | ORAL_CAPSULE | Freq: Three times a day (TID) | ORAL | 3 refills | Status: DC

## 2020-04-13 MED ORDER — MONTELUKAST SODIUM 10 MG PO TABS: 10.0000 mg | ORAL_TABLET | Freq: Every day | ORAL | 3 refills | Status: DC

## 2020-04-13 NOTE — Assessment & Plan Note (Signed)
Mildly elevated initial BP, repeat manual check improved. - Home BP readings limited  No known complications  Failed ACEi cough, Amlodipine monotherapy in past uncertain side effect, and Losartan, HCTZ  Plan:  1.  Continue current BP regimen - refill Amlodipine-benazepril 10-20mg  daily 2. Encourage improved lifestyle - low sodium diet, regular exercise 3. Continue monitor BP outside office, bring readings to next visit, if persistently >140/90 or new symptoms notify office sooner

## 2020-04-13 NOTE — Assessment & Plan Note (Signed)
Persistent problem, was affecting asthma DC Flonase Switch to Montelukast singulair 10mg  nightly

## 2020-04-13 NOTE — Patient Instructions (Addendum)
Thank you for coming to the office today.  Refilled medications.  New med Singulair (Montelukast)  Stop nose spray  Check your med list - if there is anything that is NOT CORRECT - then call us and let us know and we can fix it.  DUE for FASTING BLOOD WORK (no food or drink after midnight before the lab appointment, only water or coffee without cream/sugar on the morning of)  SCHEDULE "Lab Only" visit in the morning at the clinic for lab draw in 1 WEEK  - Make sure Lab Only appointment is at about 1 week before your next appointment, so that results will be available  For Lab Results, once available within 2-3 days of blood draw, you can can log in to MyChart online to view your results and a brief explanation. Also, we can discuss results at next follow-up visit.   Please schedule a Follow-up Appointment to: Return in about 6 months (around 10/13/2020) for 1 week fasting lab then 6 month follow-up HTN.  If you have any other questions or concerns, please feel free to call the office or send a message through MyChart. You may also schedule an earlier appointment if necessary.  Additionally, you may be receiving a survey about your experience at our office within a few days to 1 week by e-mail or mail. We value your feedback.  Saralyn Pilar, DO [facility], [place]
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
## 2020-04-13 NOTE — Assessment & Plan Note (Signed)
In remission °PHQ 0-1 °On mirtazapine °

## 2020-04-13 NOTE — Assessment & Plan Note (Signed)
Stable chronic problem Re order Gabapentin

## 2020-04-18 ENCOUNTER — Other Ambulatory Visit: Payer: Medicare Other

## 2020-04-28 DIAGNOSIS — J45909 Unspecified asthma, uncomplicated: Secondary | ICD-10-CM | POA: Diagnosis not present

## 2020-05-07 ENCOUNTER — Other Ambulatory Visit: Payer: Self-pay | Admitting: Family Medicine

## 2020-05-07 DIAGNOSIS — G8929 Other chronic pain: Secondary | ICD-10-CM

## 2020-05-07 DIAGNOSIS — G894 Chronic pain syndrome: Secondary | ICD-10-CM

## 2020-05-07 NOTE — Telephone Encounter (Signed)
Requested Prescriptions  Pending Prescriptions Disp Refills  . meloxicam (MOBIC) 15 MG tablet [Pharmacy Med Name: MELOXICAM 15MG  TABLETS] 90 tablet 0    Sig: TAKE 1 TABLET(15 MG) BY MOUTH DAILY AS NEEDED FOR PAIN     Analgesics:  COX2 Inhibitors Failed - 05/07/2020 10:04 AM      Failed - HGB in normal range and within 360 days    Hemoglobin  Date Value Ref Range Status  09/08/2018 12.6 11.7 - 15.5 g/dL Final  13/02/2018 57/32/2025 11.1 - 15.9 g/dL Final         Failed - Cr in normal range and within 360 days    Creat  Date Value Ref Range Status  09/08/2018 0.97 (H) 0.60 - 0.93 mg/dL Final    Comment:    For patients >32 years of age, the reference limit for Creatinine is approximately 13% higher for people identified as African-American. 54 - Patient is not pregnant      Passed - Valid encounter within last 12 months    Recent Outpatient Visits          3 weeks ago Annual physical exam   [facility], DO   5 months ago Chronic bilateral low back pain without sciatica   [facility], DO   11 months ago Mild intermittent asthma without status asthmaticus without complication   [facility], DO   1 year ago Annual physical exam   [facility], DO   1 year ago Essential hypertension   [facility], Althea Charon, DO      Future Appointments            In 5 months Netta Neat, Althea Charon, DO [facility], [facility]   In 11 months  [facility], [facility]

## 2020-05-12 ENCOUNTER — Ambulatory Visit: Payer: Medicare Other

## 2020-05-12 NOTE — Chronic Care Management (AMB) (Signed)
  Care Management   Follow Up Note   05/12/2020 Name: Kirsten [name] MRN: [id number] DOB: 02-14-1946  Referred by: Smitty Cords, DO Reason for referral : Care Coordination   Kirsten [name] is a 74 y.o. year old female who is a primary care patient of Smitty Cords, DO. The care management team was consulted for assistance with care management and care coordination needs.    Review of patient status, including review of consultants reports, relevant laboratory and other test results, and collaboration with appropriate care team members and the patient's provider was performed as part of comprehensive patient evaluation and provision of chronic care management services.    LCSW completed CCM outreach attempt today but was unable to reach patient successfully. A HIPPA compliant voice message was left encouraging patient to return call once available. LCSW rescheduled CCM SW appointment as well.  A HIPPA compliant phone message was left for the patient providing contact information and requesting a return call.   Dickie La, BSW, MSW, LCSW [facility]  [email or web address] Phone: [phone]

## 2020-06-02 ENCOUNTER — Telehealth: Payer: Self-pay

## 2020-06-02 ENCOUNTER — Other Ambulatory Visit: Payer: Self-pay | Admitting: Family Medicine

## 2020-06-02 DIAGNOSIS — R609 Edema, unspecified: Secondary | ICD-10-CM

## 2020-06-02 MED ORDER — FUROSEMIDE 20 MG PO TABS: 10.0000 mg | ORAL_TABLET | Freq: Every day | ORAL | 1 refills | Status: DC | PRN

## 2020-06-02 NOTE — Telephone Encounter (Signed)
Copied from CRM [id number]. Topic: General - Inquiry >> Jun 02, 2020 11:37 AM Deborha Payment wrote: Reason for CRM: Patient is requesting PCP to call her new medication Furosemide 10mg  for fluid. Patient states she has been coughing at night due to allergies and asthma.   Patient states she had this medication in the past and she had some left and it has been helping a lot. Made patient an appt for august the 4th.   Call back [phone]

## 2020-06-02 NOTE — Telephone Encounter (Signed)
Please notify patient.  I am willing to order a temporary rx for Furosemide.  Will send in 20mg  tablets, is smallest rx I have available. It can be cut in half if she wants a lower dose for 10mg .  Take half or one whole - once a day as needed. Only if swelling or needed for breathing.  If she does not have problem, should not take on that day.  We can discuss on 06/08/20 at her apt, may need further imaging and testing for her heart.  , DO [facility] 06/02/2020, 2:22 PM

## 2020-06-02 NOTE — Telephone Encounter (Signed)
Patient notified

## 2020-06-02 NOTE — Telephone Encounter (Signed)
Spoke to the patient -Kirsten [name] was Rx by [facility] provider in past which helped her fluid build up in lungs in the past, she has some cough and asthma and wanted to see if Dr Kirtland Bouchard is willing to Rx this one --her appointment is on 06/08/2020.

## 2020-06-08 ENCOUNTER — Other Ambulatory Visit: Payer: Self-pay

## 2020-06-08 ENCOUNTER — Ambulatory Visit (INDEPENDENT_AMBULATORY_CARE_PROVIDER_SITE_OTHER): Payer: Medicare Other | Admitting: Family Medicine

## 2020-06-08 ENCOUNTER — Encounter: Payer: Self-pay | Admitting: Family Medicine

## 2020-06-08 VITALS — BP 147/76 | HR 69 | Temp 97.1°F | Resp 16 | Ht 60.0 in | Wt 122.8 lb

## 2020-06-08 DIAGNOSIS — M25551 Pain in right hip: Secondary | ICD-10-CM

## 2020-06-08 DIAGNOSIS — G8929 Other chronic pain: Secondary | ICD-10-CM

## 2020-06-08 DIAGNOSIS — M8949 Other hypertrophic osteoarthropathy, multiple sites: Secondary | ICD-10-CM | POA: Diagnosis not present

## 2020-06-08 DIAGNOSIS — M150 Primary generalized (osteo)arthritis: Secondary | ICD-10-CM

## 2020-06-08 DIAGNOSIS — R011 Cardiac murmur, unspecified: Secondary | ICD-10-CM

## 2020-06-08 DIAGNOSIS — F3342 Major depressive disorder, recurrent, in full remission: Secondary | ICD-10-CM

## 2020-06-08 DIAGNOSIS — M160 Bilateral primary osteoarthritis of hip: Secondary | ICD-10-CM

## 2020-06-08 DIAGNOSIS — M25552 Pain in left hip: Secondary | ICD-10-CM

## 2020-06-08 DIAGNOSIS — M159 Polyosteoarthritis, unspecified: Secondary | ICD-10-CM

## 2020-06-08 DIAGNOSIS — I10 Essential (primary) hypertension: Secondary | ICD-10-CM

## 2020-06-08 DIAGNOSIS — M545 Low back pain: Secondary | ICD-10-CM

## 2020-06-08 MED ORDER — AMLODIPINE BESYLATE 10 MG PO TABS: 10.0000 mg | ORAL_TABLET | Freq: Every day | ORAL | 2 refills | Status: DC

## 2020-06-08 MED ORDER — BACLOFEN 10 MG PO TABS: 5.0000 mg | ORAL_TABLET | Freq: Three times a day (TID) | ORAL | 1 refills | Status: DC | PRN
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
## 2020-06-08 NOTE — Patient Instructions (Addendum)
Thank you for coming to the office today.  Discontinue Hydrochlorothiazide (HCTZ) 25mg  - do not take this.  Stop Amlodipine-Benazepril combo BP pill.  START Amlodipine 10mg  daily (single pill for BP)  I think the Benazepril was making you cough.  Heart ECHO image test at hospital, they will call with this appointment.  We have ordered X-rays of Hips and Low Back Spine, here at our office, next week any day walk in no apt needed for X-rays  Keep using Voltaren topical on joints  Please schedule a Follow-up Appointment to: Return in about 4 weeks (around 07/06/2020) for 4 weeks follow-up test results ECHO / X-rays, HTN.  If you have any other questions or concerns, please feel free to call the office or send a message through MyChart. You may also schedule an earlier appointment if necessary.  Additionally, you may be receiving a survey about your experience at our office within a few days to 1 week by e-mail or mail. We value your feedback.  , DO [facility], Saralyn Pilar

## 2020-06-09 NOTE — Assessment & Plan Note (Signed)
In remission PHQ 0-1 On mirtazapine

## 2020-06-09 NOTE — Assessment & Plan Note (Signed)
History of prior MI, HTN HLD Now concern with orthopnea, some reported edema, and dyspnea at times Heart murmur on exam Will order ECHOcardiogram, f/u results may warrant refer to Cardiology

## 2020-06-09 NOTE — Assessment & Plan Note (Addendum)
Mildly elevated initial BP, repeat manual check improved. - Home BP readings limited  No known complications  Failed ACEi cough, Amlodipine monotherapy in past uncertain side effect, and Losartan, HCTZ  Plan:  1.  Questionable if having ACEi cough now, since not resolving on allergy therapy but it is improved. Also cannot rule out other cause with history of heart murmur and orthopnea symptoms seems improved on lasix - DISCONTINUE Amlodipine-Benazepril 10-20mg  daily, due to possible ACEi cough. Cannot use ARB due to intolerance side effect. - SWITCH back to Amlodipine 10mg  daily monotherapy, even though she may have had issue on this before, difficult to tell based on history, has tolerated it recently will trial it monotherapy  Use Lasix 20mg  PRN cautiously for now, limited edema, will pursue ECHO first  2. Encourage improved lifestyle - low sodium diet, regular exercise 3. Continue monitor BP outside office, bring readings to next visit, if persistently >140/90 or new symptoms notify office sooner

## 2020-06-09 NOTE — Addendum Note (Signed)
Addended by: Smitty Cords on: 06/09/2020 02:04 PM   Modules accepted: Orders

## 2020-06-23 ENCOUNTER — Ambulatory Visit: Payer: Medicare Other

## 2020-06-28 ENCOUNTER — Ambulatory Visit (INDEPENDENT_AMBULATORY_CARE_PROVIDER_SITE_OTHER): Payer: Medicare Other | Admitting: Licensed Clinical Social Worker

## 2020-06-28 DIAGNOSIS — R011 Cardiac murmur, unspecified: Secondary | ICD-10-CM

## 2020-06-28 DIAGNOSIS — I10 Essential (primary) hypertension: Secondary | ICD-10-CM

## 2020-06-28 DIAGNOSIS — F3342 Major depressive disorder, recurrent, in full remission: Secondary | ICD-10-CM | POA: Diagnosis not present

## 2020-06-28 DIAGNOSIS — E782 Mixed hyperlipidemia: Secondary | ICD-10-CM
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
## 2020-07-06 ENCOUNTER — Ambulatory Visit: Payer: Medicare Other | Admitting: Family Medicine

## 2020-07-08 DIAGNOSIS — J45909 Unspecified asthma, uncomplicated: Secondary | ICD-10-CM | POA: Diagnosis not present

## 2020-07-12 ENCOUNTER — Inpatient Hospital Stay: Admission: RE | Admit: 2020-07-12 | Payer: Medicare Other | Source: Ambulatory Visit

## 2020-07-21 ENCOUNTER — Other Ambulatory Visit: Payer: Self-pay | Admitting: Family Medicine

## 2020-07-21 DIAGNOSIS — M545 Low back pain, unspecified: Secondary | ICD-10-CM

## 2020-07-21 DIAGNOSIS — G894 Chronic pain syndrome: Secondary | ICD-10-CM

## 2020-08-15 ENCOUNTER — Other Ambulatory Visit: Payer: Self-pay | Admitting: Family Medicine

## 2020-08-15 DIAGNOSIS — R609 Edema, unspecified: Secondary | ICD-10-CM

## 2020-08-24 DIAGNOSIS — H35033 Hypertensive retinopathy, bilateral: Secondary | ICD-10-CM | POA: Diagnosis not present

## 2020-09-12 ENCOUNTER — Other Ambulatory Visit: Payer: Self-pay | Admitting: Family Medicine

## 2020-09-12 DIAGNOSIS — I10 Essential (primary) hypertension: Secondary | ICD-10-CM

## 2020-09-12 NOTE — Telephone Encounter (Signed)
Requested medication (s) are due for refill today: yes  Requested medication (s) are on the active medication list: yes  Last refill:  06/08/20  Future visit scheduled: yes  Notes to clinic:  pt canceled f/u appt 07/06/20 and was a no show for echo.    Requested Prescriptions  Pending Prescriptions Disp Refills   amLODipine (NORVASC) 10 MG tablet [Pharmacy Med Name: AMLODIPINE BESYLATE 10MG  TABLETS] 30 tablet 2    Sig: TAKE 1 TABLET(10 MG) BY MOUTH DAILY      Cardiovascular:  Calcium Channel Blockers Failed - 09/12/2020  2:07 PM      Failed - Last BP in normal range    BP Readings from Last 1 Encounters:  06/08/20 (!) 147/76          Passed - Valid encounter within last 6 months    Recent Outpatient Visits           3 months ago Essential hypertension   [facility] [place], [place]   5 months ago Annual physical exam   [facility], DO   10 months ago Chronic bilateral low back pain without sciatica   [facility] [place], [place]   1 year ago Mild intermittent asthma without status asthmaticus without complication   [facility], DO   1 year ago Annual physical exam   [facility], DO       Future Appointments             In 1 month Smitty Cords, Althea Charon, DO [facility]   In 6 months  [place], [facility]

## 2020-10-14 ENCOUNTER — Ambulatory Visit: Payer: Medicare Other | Admitting: Family Medicine

## 2020-11-03 ENCOUNTER — Other Ambulatory Visit: Payer: Self-pay | Admitting: Family Medicine

## 2020-11-03 DIAGNOSIS — R609 Edema, unspecified: Secondary | ICD-10-CM

## 2021-01-06 ENCOUNTER — Other Ambulatory Visit: Payer: Self-pay | Admitting: Family Medicine

## 2021-01-06 DIAGNOSIS — E782 Mixed hyperlipidemia: Secondary | ICD-10-CM

## 2021-01-06 DIAGNOSIS — I10 Essential (primary) hypertension: Secondary | ICD-10-CM

## 2021-01-06 NOTE — Telephone Encounter (Signed)
Requested Prescriptions  Pending Prescriptions Disp Refills  . hydrochlorothiazide (HYDRODIURIL) 25 MG tablet [Pharmacy Med Name: HYDROCHLOROTHIAZIDE 25MG  TABLETS] 90 tablet 3    Sig: TAKE 1 TABLET(25 MG) BY MOUTH DAILY     Cardiovascular: Diuretics - Thiazide Failed - 01/06/2021  6:27 AM      Failed - Ca in normal range and within 360 days    Calcium  Date Value Ref Range Status  09/08/2018 9.5 8.6 - 10.4 mg/dL Final   Calcium, Total  Date Value Ref Range Status  09/13/2014 8.6 8.5 - 10.1 mg/dL Final         Failed - Cr in normal range and within 360 days    Creat  Date Value Ref Range Status  09/08/2018 0.97 (H) 0.60 - 0.93 mg/dL Final    Comment:    For patients >68 years of age, the reference limit for Creatinine is approximately 13% higher for people identified as African-American. .          Failed - K in normal range and within 360 days    Potassium  Date Value Ref Range Status  09/08/2018 3.9 3.5 - 5.3 mmol/L Final  09/13/2014 3.8 3.5 - 5.1 mmol/L Final         Failed - Na in normal range and within 360 days    Sodium  Date Value Ref Range Status  09/08/2018 141 135 - 146 mmol/L Final  11/09/2015 145 (H) 134 - 144 mmol/L Final  09/13/2014 138 136 - 145 mmol/L Final         Failed - Last BP in normal range    BP Readings from Last 1 Encounters:  06/08/20 (!) 147/76         Failed - Valid encounter within last 6 months    Recent Outpatient Visits          7 months ago Essential hypertension   [facility], DO   8 months ago Annual physical exam   [facility] [place] [place], [place]   1 year ago Chronic bilateral low back pain without sciatica   [facility], DO   1 year ago Mild intermittent asthma without status asthmaticus without complication   [facility], DO   2 years ago Annual physical exam   [facility], DO      Future Appointments            In 2 months [facility]            . rosuvastatin (CRESTOR) 20 MG tablet [Pharmacy Med Name: ROSUVASTATIN 20MG  TABLETS] 90 tablet 0    Sig: TAKE 1 TABLET(20 MG) BY MOUTH DAILY     Cardiovascular:  Antilipid - Statins Failed - 01/06/2021  6:27 AM      Failed - Total Cholesterol in normal range and within 360 days    Cholesterol, Total  Date Value Ref Range Status  11/09/2015 205 (H) 100 - 199 mg/dL Final   Cholesterol  Date Value Ref Range Status  09/08/2018 258 (H) <200 mg/dL Final  01/07/2016 13/02/2018 (H) 0 - 200 mg/dL Final         Failed - LDL in normal range and within 360 days    Ldl Cholesterol, Calc  Date Value Ref Range Status  07/18/2014 133 (H) 0 - 100 mg/dL Final   LDL Cholesterol (  Calc)  Date Value Ref Range Status  09/08/2018 186 (H) mg/dL (calc) Final    Comment:    Reference range: <100 . Desirable range <100 mg/dL for primary prevention;   <70 mg/dL for patients with CHD or diabetic patients  with > or = 2 CHD risk factors. Marland Kitchen LDL-C is now calculated using the Martin-Hopkins  calculation, which is a validated novel method providing  better accuracy than the Friedewald equation in the  estimation of LDL-C.  Horald Pollen et al. Lenox Ahr. 1962;229(79): 2061-2068  (http://education.[employer].com/faq/FAQ164)          Failed - HDL in normal range and within 360 days    HDL Cholesterol  Date Value Ref Range Status  07/18/2014 57 40 - 60 mg/dL Final   HDL  Date Value Ref Range Status  09/08/2018 53 >50 mg/dL Final  89/21/1939 50 >74 mg/dL Final         Failed - Triglycerides in normal range and within 360 days    Triglycerides  Date Value Ref Range Status  09/08/2018 81 <150 mg/dL Final  06/18/4817 78 0 - 200 mg/dL Final         Passed - Patient is not pregnant      Passed - Valid encounter within last 12 months    Recent Outpatient Visits           7 months ago Essential hypertension   [facility], DO   8 months ago Annual physical exam   [facility], DO   1 year ago Chronic bilateral low back pain without sciatica   [facility], DO   1 year ago Mild intermittent asthma without status asthmaticus without complication   [facility] Smit[facility] Cords, DO   2 years ago Annual physical exam   [facility] Smitty Cords, DO      Future Appointments            In 2 months [facility], [facility]

## 2021-02-07 DIAGNOSIS — J45909 Unspecified asthma, uncomplicated: Secondary | ICD-10-CM | POA: Diagnosis not present

## 2021-02-08 DIAGNOSIS — J45909 Unspecified asthma, uncomplicated: Secondary | ICD-10-CM | POA: Diagnosis not present

## 2021-03-02 DIAGNOSIS — J45909 Unspecified asthma, uncomplicated: Secondary | ICD-10-CM | POA: Diagnosis not present

## 2021-03-10 DIAGNOSIS — J45909 Unspecified asthma, uncomplicated: Secondary | ICD-10-CM | POA: Diagnosis not present

## 2021-04-04 ENCOUNTER — Ambulatory Visit: Payer: Medicare Other

## 2021-04-04 ENCOUNTER — Telehealth: Payer: Self-pay

## 2021-04-04 DIAGNOSIS — J45909 Unspecified asthma, uncomplicated: Secondary | ICD-10-CM | POA: Diagnosis not present

## 2021-04-04 NOTE — Telephone Encounter (Signed)
I called patient in order to do our scheduled telephonic AWV. She stated that she was on the highway and would like to have someone call her at a later time to reschedule.

## 2021-04-10 DIAGNOSIS — J45909 Unspecified asthma, uncomplicated: Secondary | ICD-10-CM | POA: Diagnosis not present

## 2021-04-26 ENCOUNTER — Encounter: Payer: Self-pay | Admitting: Family Medicine

## 2021-04-26 ENCOUNTER — Ambulatory Visit (INDEPENDENT_AMBULATORY_CARE_PROVIDER_SITE_OTHER): Payer: Medicare Other | Admitting: Family Medicine

## 2021-04-26 ENCOUNTER — Other Ambulatory Visit: Payer: Self-pay

## 2021-04-26 VITALS — Wt 122.0 lb

## 2021-04-26 DIAGNOSIS — J4540 Moderate persistent asthma, uncomplicated: Secondary | ICD-10-CM

## 2021-04-26 DIAGNOSIS — Z77120 Contact with and (suspected) exposure to mold (toxic): Secondary | ICD-10-CM | POA: Diagnosis not present

## 2021-04-26 DIAGNOSIS — J3089 Other allergic rhinitis: Secondary | ICD-10-CM | POA: Diagnosis not present

## 2021-04-26 MED ORDER — FLUTICASONE PROPIONATE 50 MCG/ACT NA SUSP: 2.0000 | Freq: Every day | NASAL | 3 refills | Status: DC

## 2021-04-26 MED ORDER — PREDNISONE 20 MG PO TABS: ORAL_TABLET | ORAL | 0 refills | Status: DC

## 2021-04-26 MED ORDER — FLUTICASONE-SALMETEROL 250-50 MCG/ACT IN AEPB: 1.0000 | INHALATION_SPRAY | Freq: Two times a day (BID) | RESPIRATORY_TRACT | 3 refills | Status: DC

## 2021-04-26 MED ORDER — ALBUTEROL SULFATE HFA 108 (90 BASE) MCG/ACT IN AERS: 2.0000 | INHALATION_SPRAY | Freq: Four times a day (QID) | RESPIRATORY_TRACT | 2 refills | Status: DC | PRN

## 2021-04-26 NOTE — Progress Notes (Signed)
Virtual Visit via Telephone The purpose of this virtual visit is to provide medical care while limiting exposure to the novel coronavirus (COVID19) for both patient and office staff.  Consent was obtained for phone visit:  Yes.   Answered questions that patient had about telehealth interaction:  Yes.   I discussed the limitations, risks, security and privacy concerns of performing an evaluation and management service by telephone. I also discussed with the patient that there may be a patient responsible charge related to this service. The patient expressed understanding and agreed to proceed.  Patient Location: Office parking lot Provider Location: [employer] (Office)  Participants in virtual visit: - Patient: Kirsten [name] - CMA: Orinda Kenner, CMA - Provider: Dr Parks Ranger  ---------------------------------------------------------------------- Chief Complaint  Patient presents with   Cough   Allergies    S: Reviewed CMA documentation. 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 Admits some recent allergy eye swelling reaction as well. Worse cough at night. Non productive but has sinus drainage and phlegm production - Tried OTC Vicks Completed COVID19 vaccine x 4 including booster x2  Denies any fevers, chills, sweats, body ache, shortness of breath, sinus pain or pressure, headache, abdominal pain, diarrhea  Past Medical History:  Diagnosis Date   Allergy    Arthritis    Asthma    Back injury    Heart disease    History of blood clots    History of chicken pox    Hyperlipidemia    Hypertension    Memory loss    Migraine headache    Myocardial infarction [facility])    Social History   Tobacco Use    Smoking status: Never   Smokeless tobacco: Never  Vaping Use   Vaping Use: Never used  Substance Use Topics   Alcohol use: No    Alcohol/week: 0.0 standard drinks   Drug use: No    Current Outpatient Medications:    amLODipine (NORVASC) 10 MG tablet, TAKE 1 TABLET(10 MG) BY MOUTH DAILY, Disp: 30 tablet, Rfl: 2   aspirin EC 81 MG tablet, Take 1 tablet (81 mg total) by mouth daily., Disp: , Rfl:    baclofen (LIORESAL) 10 MG tablet, Take 0.5-1 tablets (5-10 mg total) by mouth 3 (three) times daily as needed for muscle spasms., Disp: 30 each, Rfl: 1   fexofenadine (ALLEGRA) 180 MG tablet, Take 1 tablet (180 mg total) by mouth daily., Disp: 90 tablet, Rfl: 3   fluticasone (FLONASE) 50 MCG/ACT nasal spray, Place 2 sprays into both nostrils daily. Use for 4-6 weeks then stop and use seasonally or as needed., Disp: 16 g, Rfl: 3   fluticasone-salmeterol (WIXELA INHUB) 250-50 MCG/ACT AEPB, Inhale 1 puff into the lungs in the morning and at bedtime., Disp: 60 each, Rfl: 3   furosemide (LASIX) 20 MG tablet, TAKE 1/2 TO 1 TABLET(10 TO 20 MG) BY MOUTH DAILY AS NEEDED FOR FLUID RETENTION, Disp: 30 tablet, Rfl: 1   gabapentin (NEURONTIN) 300 MG capsule, Take 1 capsule (300 mg total) by mouth 3 (three) times daily., Disp: 270 capsule, Rfl: 3   mirtazapine (REMERON) 15 MG tablet, , Disp: , Rfl:    montelukast (SINGULAIR) 10 MG tablet, Take 1 tablet (10 mg total) by mouth at bedtime., Disp: 90 tablet,  Rfl: 3   predniSONE (DELTASONE) 20 MG tablet, Take daily with food. Start with 17m (3 pills) x 2 days, then reduce to 413m(2 pills) x 2 days, then 2029m1 pill) x 3 days, Disp: 13 tablet, Rfl: 0   Respir Rate Dev-Bld Press Mon (RESPERATE 1.0) KIT, Use daily to help lower blood pressure, Disp: 1 kit, Rfl: 0   rosuvastatin (CRESTOR) 20 MG tablet, TAKE 1 TABLET(20 MG) BY MOUTH DAILY, Disp: 90 tablet, Rfl: 0   albuterol (PROVENTIL HFA) 108 (90 Base) MCG/ACT inhaler, Inhale 2 puffs into the lungs every 6 (six) hours  as needed for wheezing or shortness of breath., Disp: 8 g, Rfl: 2  [facility] 04/26/2021 06/08/2020 04/13/2020  Decreased Interest 0 0 0  Down, Depressed, Hopeless 0 0 0  PHQ - 2 Score 0 0 0  Altered sleeping 1 0 0  Tired, decreased energy _0 Change in appetite 2 0 0  Feeling bad or failure about yourself  0 0 0  Trouble concentrating 3 0 0  Moving slowly or fidgety/restless 0 0 0  Suicidal thoughts 0 0 0  PHQ-9 Score _1 Difficult doing work/chores Not difficult at all Not difficult at all Not difficult at all  Some recent data might be hidden    GAD 7 : Generalized Anxiety Score 04/26/2021 09/15/2018  Nervous, Anxious, on Edge 1 2  Control/stop worrying 0 3  Worry too much - different things 0 3  Trouble relaxing 2 2  Restless 0 3  Easily annoyed or irritable 0 2  Afraid - awful might happen 0 2  Total GAD 7 Score 3 17  Anxiety Difficulty Not difficult at all Somewhat difficult    -------------------------------------------------------------------------- O: No physical exam performed due to remote telephone encounter.  Lab results reviewed.  No results found for this or any previous visit (from the past 2160 hour(s)).  -------------------------------------------------------------------------- A&P:  Problem List Items Addressed This Visit     Asthma, moderate persistent - Primary   Relevant Medications   albuterol (PROVENTIL HFA) 108 (90 Base) MCG/ACT inhaler   fluticasone-salmeterol (WIXELA INHUB) 250-50 MCG/ACT AEPB   predniSONE (DELTASONE) 20 MG tablet   Allergic rhinitis due to allergen   Relevant Medications   fluticasone (FLONASE) 50 MCG/ACT nasal spray   Other Visit Diagnoses     Mold suspected exposure          Clinically with asthma moderate persistent possible recent exacerbation flare up With mold hypersensitivity exposure in current living situation   Will write note that she can have for her housing and she needs to relocate.  Printed to be given to patient.  For Asthma Prednisone taper for acute asthma and cough Add Wixela (generic advair) 1 puff BID  For allergies Start nasal steroid Flonase 2 sprays in each nostril daily for 4-6 weeks, may repeat course seasonally or as needed  Re order Albuterol PRN  Meds ordered this encounter  Medications   albuterol (PROVENTIL HFA) 108 (90 Base) MCG/ACT inhaler    Sig: Inhale 2 puffs into the lungs every 6 (six) hours as needed for wheezing or shortness of breath.    Dispense:  8 g    Refill:  2   fluticasone-salmeterol (WIXELA INHUB) 250-50 MCG/ACT AEPB    Sig: Inhale 1 puff into the lungs in the morning and at bedtime.    Dispense:  60 each    Refill:  3   fluticasone (FLONASE) 50  MCG/ACT nasal spray    Sig: Place 2 sprays into both nostrils daily. Use for 4-6 weeks then stop and use seasonally or as needed.    Dispense:  16 g    Refill:  3   predniSONE (DELTASONE) 20 MG tablet    Sig: Take daily with food. Start with 62m (3 pills) x 2 days, then reduce to 467m(2 pills) x 2 days, then 2066m1 pill) x 3 days    Dispense:  13 tablet    Refill:  0    Follow-up: - Return in 1 week PRN  Patient verbalizes understanding with the above medical recommendations including the limitation of remote medical advice.  Specific follow-up and call-back criteria were given for patient to follow-up or seek medical care more urgently if needed.   - Time spent in direct consultation with patient on phone: 8 minutes  AleNobie PutnamO [place] 04/26/2021, 9:24 AM

## 2021-04-26 NOTE — Patient Instructions (Addendum)
For asthma  Start Prednisone taper 7 days.  Start nasal steroid Flonase 2 sprays in each nostril daily for 4-6 weeks, may repeat course seasonally or as needed  Start Wixela (Generic Advair) DAILY inhaler - use this TWICE every day 1 puff each time.  Use Albuterol inhaler as needed for short term cough / wheezing / short of breath - RESCUE INHALER ONLY or use Nebulizer.    Please schedule a Follow-up Appointment to: No follow-ups on file.  If you have any other questions or concerns, please feel free to call the office or send a message through MyChart. You may also schedule an earlier appointment if necessary.  Additionally, you may be receiving a survey about your experience at our office within a few days to 1 week by e-mail or mail. We value your feedback.  Saralyn Pilar, DO [facility], [place]

## 2021-05-01 DIAGNOSIS — H2513 Age-related nuclear cataract, bilateral: Secondary | ICD-10-CM | POA: Diagnosis not present

## 2021-05-02 ENCOUNTER — Ambulatory Visit (INDEPENDENT_AMBULATORY_CARE_PROVIDER_SITE_OTHER): Payer: Medicare Other

## 2021-05-02 VITALS — Ht 61.0 in | Wt 113.0 lb

## 2021-05-02 DIAGNOSIS — Z0000 Encounter for general adult medical examination without abnormal findings: Secondary | ICD-10-CM | POA: Diagnosis not present

## 2021-05-02 NOTE — Progress Notes (Signed)
I connected with Kirsten [name] today by telephone and verified that I am speaking with the correct person using two identifiers. Location patient: home Location provider: work Persons participating in the virtual visit: Darcell Yacoub, Glenna Durand LPN.   I discussed the limitations, risks, security and privacy concerns of performing an evaluation and management service by telephone and the availability of in person appointments. I also discussed with the patient that there may be a patient responsible charge related to this service. The patient expressed understanding and verbally consented to this telephonic visit.    Interactive audio and video telecommunications were attempted between this provider and patient, however failed, due to patient having technical difficulties OR patient did not have access to video capability.  We continued and completed visit with audio only.     Vital signs may be patient reported or missing.  Subjective:   Kirsten [name] is a 75 y.o. female who presents for Medicare Annual (Subsequent) preventive examination.  Review of Systems     Cardiac Risk Factors include: advanced age (>80mn, >>18women);dyslipidemia;hypertension     Objective:    Today's Vitals   05/02/21 0937  Weight: 113 lb (51.3 kg)  Height: '5\' 1"'  (1.549 m)   Body mass index is 21.35 kg/m.  Advanced Directives 05/02/2021 03/29/2020 02/13/2020 04/15/2018 10/03/2017 09/30/2017 06/17/2017  Does Patient Have a Medical Advance Directive? No No No No No No No  Would patient like information on creating a medical advance directive? - Yes (MAU/Ambulatory/Procedural Areas - Information given) No - Patient declined No - Patient declined No - Patient declined No - Patient declined No - Patient declined    Current Medications (verified) Outpatient Encounter Medications as of 05/02/2021  Medication Sig   albuterol (PROVENTIL HFA) 108 (90 Base) MCG/ACT inhaler Inhale 2 puffs into the lungs every 6  (six) hours as needed for wheezing or shortness of breath.   amLODipine (NORVASC) 10 MG tablet TAKE 1 TABLET(10 MG) BY MOUTH DAILY   aspirin EC 81 MG tablet Take 1 tablet (81 mg total) by mouth daily.   baclofen (LIORESAL) 10 MG tablet Take 0.5-1 tablets (5-10 mg total) by mouth 3 (three) times daily as needed for muscle spasms.   fexofenadine (ALLEGRA) 180 MG tablet Take 1 tablet (180 mg total) by mouth daily.   fluticasone (FLONASE) 50 MCG/ACT nasal spray Place 2 sprays into both nostrils daily. Use for 4-6 weeks then stop and use seasonally or as needed.   fluticasone-salmeterol (WIXELA INHUB) 250-50 MCG/ACT AEPB Inhale 1 puff into the lungs in the morning and at bedtime.   furosemide (LASIX) 20 MG tablet TAKE 1/2 TO 1 TABLET(10 TO 20 MG) BY MOUTH DAILY AS NEEDED FOR FLUID RETENTION   gabapentin (NEURONTIN) 300 MG capsule Take 1 capsule (300 mg total) by mouth 3 (three) times daily.   mirtazapine (REMERON) 15 MG tablet    montelukast (SINGULAIR) 10 MG tablet Take 1 tablet (10 mg total) by mouth at bedtime.   predniSONE (DELTASONE) 20 MG tablet Take daily with food. Start with 656m(3 pills) x 2 days, then reduce to 4060m2 pills) x 2 days, then 63m33m pill) x 3 days   Respir Rate Dev-Bld Press Mon (RESPERATE 1.0) KIT Use daily to help lower blood pressure   rosuvastatin (CRESTOR) 20 MG tablet TAKE 1 TABLET(20 MG) BY MOUTH DAILY   No facility-administered encounter medications on file as of 05/02/2021.    Allergies (verified) Hydrochlorothiazide   History: Past Medical History:  Diagnosis  Date   Allergy    Arthritis    Asthma    Back injury    Heart disease    History of blood clots    History of chicken pox    Hyperlipidemia    Hypertension    Memory loss    Migraine headache    Myocardial infarction [facility])    Past Surgical History:  Procedure Laterality Date   ABDOMINAL HYSTERECTOMY     APPENDECTOMY     BOWEL RESECTION     COLONOSCOPY WITH PROPOFOL N/A 06/17/2017    Procedure: COLONOSCOPY WITH PROPOFOL;  Surgeon: Lollie Sails, MD;  Location: [facility];  Service: Endoscopy;  Laterality: N/A;   Family History  Problem Relation Age of Onset   Cancer Father        unknown type   Social History   Socioeconomic History   Marital status: Widowed    Spouse name: Not on file   Number of children: Not on file   Years of education: Not on file   Highest education level: Not on file  Occupational History   Not on file  Tobacco Use   Smoking status: Never   Smokeless tobacco: Never  Vaping Use   Vaping Use: Never used  Substance and Sexual Activity   Alcohol use: No    Alcohol/week: 0.0 standard drinks   Drug use: No   Sexual activity: Not on file  Other Topics Concern   Not on file  Social History Narrative   Not on file   Social Determinants of Health   Financial Resource Strain: Low Risk    Difficulty of Paying Living Expenses: Not hard at all  Food Insecurity: No Food Insecurity   Worried About Charity fundraiser in the Last Year: Never true   [place] in the Last Year: Never true  Transportation Needs: No Transportation Needs   Lack of Transportation (Medical): No   Lack of Transportation (Non-Medical): No  Physical Activity: Inactive   Days of Exercise per Week: 0 days   Minutes of Exercise per Session: 0 min  Stress: No Stress Concern Present   Feeling of Stress : Not at all  Social Connections: Not on file    Tobacco Counseling Counseling given: Not Answered   Clinical Intake:  Pre-visit preparation completed: Yes  Pain : No/denies pain     Nutritional Status: BMI of 19-24  Normal Nutritional Risks: None Diabetes: No  How often do you need to have someone help you when you read instructions, pamphlets, or other written materials from your doctor or pharmacy?: 1 - Never What is the last grade level you completed in school?: 10th grade  Diabetic?no  Interpreter Needed?: No  Information entered  by :: NAllen LPN   Activities of Daily Living In your present state of health, do you have any difficulty performing the following activities: 05/02/2021  Hearing? N  Vision? N  Difficulty concentrating or making decisions? N  Walking or climbing stairs? N  Dressing or bathing? N  Doing errands, shopping? N  Preparing Food and eating ? N  Using the Toilet? N  In the past six months, have you accidently leaked urine? Y  Do you have problems with loss of bowel control? N  Managing your Medications? N  Managing your Finances? N  Housekeeping or managing your Housekeeping? N  Some recent data might be hidden    Patient Care Team: Olin Hauser, DO as PCP - General (Family  Medicine) Greg Cutter, LCSW as Social Worker (Licensed Clinical Social Worker)  Indicate any recent [employer] you may have received from other than [facility] providers in the past year (date may be approximate).     Assessment:   This is a routine wellness examination for [facility].  Hearing/Vision screen Vision Screening - Comments:: Regular eye exams, [facility]  Dietary issues and exercise activities discussed: Current Exercise Habits: The patient does not participate in regular exercise at present   Goals Addressed             This Visit's Progress    Patient Stated       05/02/2021, no goals        Depression Screen PHQ 2/9 Scores 05/02/2021 04/26/2021 06/08/2020 04/13/2020 03/29/2020 11/17/2019 05/22/2019  PHQ - 2 Score 0 0 0 0 1 0 3  PHQ- 9 Score - '8 1 1 ' - 5 9    Fall Risk Fall Risk  05/02/2021 04/26/2021 04/13/2020 03/29/2020 05/22/2019  Falls in the past year? 0 0 0 0 0  Number falls in past yr: - 0 0 0 0  Injury with Fall? - 0 0 0 -  Risk for fall due to : Medication side effect - - - -  Follow up Falls evaluation completed;Education provided;Falls prevention discussed Falls evaluation completed Falls evaluation completed - -    FALL RISK PREVENTION PERTAINING TO THE  HOME:  Any stairs in or around the home? No  If so, are there any without handrails?  Home free of loose throw rugs in walkways, pet beds, electrical cords, etc? Yes  Adequate lighting in your home to reduce risk of falls? Yes   ASSISTIVE DEVICES UTILIZED TO PREVENT FALLS:  Life alert? No  Use of a cane, walker or w/c? No  Grab bars in the bathroom? No  Shower chair or bench in shower? No  Elevated toilet seat or a handicapped toilet? No   TIMED UP AND GO:  Was the test performed? No .      Cognitive Function:     6CIT Screen 04/15/2018 04/09/2017  What Year? 0 points 0 points  What month? 0 points 0 points  What time? 0 points 0 points  Count back from 20 0 points 0 points  Months in reverse 0 points 4 points  Repeat phrase 0 points 6 points  Total Score 0 10    Immunizations Immunization History  Administered Date(s) Administered   Fluad Quad(high Dose 65+) 11/09/2019   Influenza, High Dose Seasonal PF 09/20/2015, 09/15/2018, 07/21/2019   Influenza-Unspecified 08/23/2017, 12/10/2017   Moderna Sars-Covid-2 Vaccination 04/18/2021   PFIZER(Purple Top)SARS-COV-2 Vaccination 12/19/2019, 01/09/2020, 08/22/2020   Pneumococcal Conjugate-13 08/16/2014   Pneumococcal Polysaccharide-23 02/10/2014   Tdap 02/10/2014   Zoster Recombinat (Shingrix) 11/09/2019    TDAP status: Up to date  Flu Vaccine status: Up to date  Pneumococcal vaccine status: Up to date  Covid-19 vaccine status: Completed vaccines  Qualifies for Shingles Vaccine? Yes   Zostavax completed Yes   Shingrix Completed?: Yes  Screening Tests Health Maintenance  Topic Date Due   MAMMOGRAM  09/19/2012   Zoster Vaccines- Shingrix (2 of 2) 01/04/2020   INFLUENZA VACCINE  06/05/2021   TETANUS/TDAP  09/05/2024   COLONOSCOPY (Pts 45-8yr Insurance coverage will need to be confirmed)  06/18/2027   DEXA SCAN  Completed   COVID-19 Vaccine  Completed   Hepatitis C Screening  Completed   PNA vac Low Risk  Adult  Completed  HPV VACCINES  Aged Out    Health Maintenance  Health Maintenance Due  Topic Date Due   MAMMOGRAM  09/19/2012   Zoster Vaccines- Shingrix (2 of 2) 01/04/2020    Colorectal cancer screening: No longer required.   Mammogram status: No longer required due to age.  Bone Density status: Completed 07/06/2014.   Lung Cancer Screening: (Low Dose CT Chest recommended if Age 71-80 years, 30 pack-year currently smoking OR have quit w/in 15years.) does not qualify.   Lung Cancer Screening Referral: no  Additional Screening:  Hepatitis C Screening: does qualify; Completed 09/20/2015  Vision Screening: Recommended annual ophthalmology exams for early detection of glaucoma and other disorders of the eye. Is the patient up to date with their annual eye exam?  Yes  Who is the provider or what is the name of the office in which the patient attends annual eye exams? [facility] If pt is not established with a provider, would they like to be referred to a provider to establish care? No .   Dental Screening: Recommended annual dental exams for proper oral hygiene  Community Resource Referral / Chronic Care Management: CRR required this visit?  No   CCM required this visit?  No      Plan:     I have personally reviewed and noted the following in the patient's chart:   Medical and social history Use of alcohol, tobacco or illicit drugs  Current medications and supplements including opioid prescriptions.  Functional ability and status Nutritional status Physical activity Advanced directives List of other physicians Hospitalizations, surgeries, and ER visits in previous 12 months Vitals Screenings to include cognitive, depression, and falls Referrals and appointments  In addition, I have reviewed and discussed with patient certain preventive protocols, quality metrics, and best practice recommendations. A written personalized care plan for preventive services  as well as general preventive health recommendations were provided to patient.     Kellie Simmering, LPN   [id number]   Nurse Notes: 6 CIT not administered. Patient is cognitive per direct conversation.

## 2021-05-02 NOTE — Patient Instructions (Signed)
Kirsten [name] , Thank you for taking time to come for your Medicare Wellness Visit. I appreciate your ongoing commitment to your health goals. Please review the following plan we discussed and let me know if I can assist you in the future.   Screening recommendations/referrals: Colonoscopy: not required Mammogram: not required Bone Density: completed 07/06/2014 Recommended yearly ophthalmology/optometry visit for glaucoma screening and checkup Recommended yearly dental visit for hygiene and checkup  Vaccinations: Influenza vaccine: due 06/05/2021 Pneumococcal vaccine: completed 08/16/2014 Tdap vaccine: completed 02/10/2014, due 02/11/2024 Shingles vaccine: discussed   Covid-19: 04/18/2021, 08/22/2020, 01/09/2020, 12/19/2019  Advanced directives: Advance directive discussed with you today.   Conditions/risks identified: none  Next appointment: Follow up in one year for your annual wellness visit    Preventive Care 65 Years and Older, Female Preventive care refers to lifestyle choices and visits with your health care provider that can promote health and wellness. What does preventive care include? A yearly physical exam. This is also called an annual well check. Dental exams once or twice a year. Routine eye exams. Ask your health care provider how often you should have your eyes checked. Personal lifestyle choices, including: Daily care of your teeth and gums. Regular physical activity. Eating a healthy diet. Avoiding tobacco and drug use. Limiting alcohol use. Practicing safe sex. Taking low-dose aspirin every day. Taking vitamin and mineral supplements as recommended by your health care provider. What happens during an annual well check? The services and screenings done by your health care provider during your annual well check will depend on your age, overall health, lifestyle risk factors, and family history of disease. Counseling  Your health care provider may ask you questions about  your: Alcohol use. Tobacco use. Drug use. Emotional well-being. Home and relationship well-being. Sexual activity. Eating habits. History of falls. Memory and ability to understand (cognition). Work and work Astronomer. Reproductive health. Screening  You may have the following tests or measurements: Height, weight, and BMI. Blood pressure. Lipid and cholesterol levels. These may be checked every 5 years, or more frequently if you are over 34 years old. Skin check. Lung cancer screening. You may have this screening every year starting at age 27 if you have a 30-pack-year history of smoking and currently smoke or have quit within the past 15 years. Fecal occult blood test (FOBT) of the stool. You may have this test every year starting at age 8. Flexible sigmoidoscopy or colonoscopy. You may have a sigmoidoscopy every 5 years or a colonoscopy every 10 years starting at age 38. Hepatitis C blood test. Hepatitis B blood test. Sexually transmitted disease (STD) testing. Diabetes screening. This is done by checking your blood sugar (glucose) after you have not eaten for a while (fasting). You may have this done every 1-3 years. Bone density scan. This is done to screen for osteoporosis. You may have this done starting at age 52. Mammogram. This may be done every 1-2 years. Talk to your health care provider about how often you should have regular mammograms. Talk with your health care provider about your test results, treatment options, and if necessary, the need for more tests. Vaccines  Your health care provider may recommend certain vaccines, such as: Influenza vaccine. This is recommended every year. Tetanus, diphtheria, and acellular pertussis (Tdap, Td) vaccine. You may need a Td booster every 10 years. Zoster vaccine. You may need this after age 24. Pneumococcal 13-valent conjugate (PCV13) vaccine. One dose is recommended after age 52. Pneumococcal polysaccharide (PPSV23) vaccine.  One dose is recommended after age 55. Talk to your health care provider about which screenings and vaccines you need and how often you need them. This information is not intended to replace advice given to you by your health care provider. Make sure you discuss any questions you have with your health care provider. Document Released: 11/18/2015 Document Revised: 07/11/2016 Document Reviewed: 08/23/2015 Elsevier Interactive Patient Education  2017 [employer] Prevention in the Home Falls can cause injuries. They can happen to people of all ages. There are many things you can do to make your home safe and to help prevent falls. What can I do on the outside of my home? Regularly fix the edges of walkways and driveways and fix any cracks. Remove anything that might make you trip as you walk through a door, such as a raised step or threshold. Trim any bushes or trees on the path to your home. Use bright outdoor lighting. Clear any walking paths of anything that might make someone trip, such as rocks or tools. Regularly check to see if handrails are loose or broken. Make sure that both sides of any steps have handrails. Any raised decks and porches should have guardrails on the edges. Have any leaves, snow, or ice cleared regularly. Use sand or salt on walking paths during winter. Clean up any spills in your garage right away. This includes oil or grease spills. What can I do in the bathroom? Use night lights. Install grab bars by the toilet and in the tub and shower. Do not use towel bars as grab bars. Use non-skid mats or decals in the tub or shower. If you need to sit down in the shower, use a plastic, non-slip stool. Keep the floor dry. Clean up any water that spills on the floor as soon as it happens. Remove soap buildup in the tub or shower regularly. Attach bath mats securely with double-sided non-slip rug tape. Do not have throw rugs and other things on the floor that can make  you trip. What can I do in the bedroom? Use night lights. Make sure that you have a light by your bed that is easy to reach. Do not use any sheets or blankets that are too big for your bed. They should not hang down onto the floor. Have a firm chair that has side arms. You can use this for support while you get dressed. Do not have throw rugs and other things on the floor that can make you trip. What can I do in the kitchen? Clean up any spills right away. Avoid walking on wet floors. Keep items that you use a lot in easy-to-reach places. If you need to reach something above you, use a strong step stool that has a grab bar. Keep electrical cords out of the way. Do not use floor polish or wax that makes floors slippery. If you must use wax, use non-skid floor wax. Do not have throw rugs and other things on the floor that can make you trip. What can I do with my stairs? Do not leave any items on the stairs. Make sure that there are handrails on both sides of the stairs and use them. Fix handrails that are broken or loose. Make sure that handrails are as long as the stairways. Check any carpeting to make sure that it is firmly attached to the stairs. Fix any carpet that is loose or worn. Avoid having throw rugs at the top or bottom of the  stairs. If you do have throw rugs, attach them to the floor with carpet tape. Make sure that you have a light switch at the top of the stairs and the bottom of the stairs. If you do not have them, ask someone to add them for you. What else can I do to help prevent falls? Wear shoes that: Do not have high heels. Have rubber bottoms. Are comfortable and fit you well. Are closed at the toe. Do not wear sandals. If you use a stepladder: Make sure that it is fully opened. Do not climb a closed stepladder. Make sure that both sides of the stepladder are locked into place. Ask someone to hold it for you, if possible. Clearly mark and make sure that you can  see: Any grab bars or handrails. First and last steps. Where the edge of each step is. Use tools that help you move around (mobility aids) if they are needed. These include: Canes. Walkers. Scooters. Crutches. Turn on the lights when you go into a dark area. Replace any light bulbs as soon as they burn out. Set up your furniture so you have a clear path. Avoid moving your furniture around. If any of your floors are uneven, fix them. If there are any pets around you, be aware of where they are. Review your medicines with your doctor. Some medicines can make you feel dizzy. This can increase your chance of falling. Ask your doctor what other things that you can do to help prevent falls. This information is not intended to replace advice given to you by your health care provider. Make sure you discuss any questions you have with your health care provider. Document Released: 08/18/2009 Document Revised: 03/29/2016 Document Reviewed: 11/26/2014 Elsevier Interactive Patient Education  2017 [employer].

## 2021-05-03 DIAGNOSIS — J45909 Unspecified asthma, uncomplicated: Secondary | ICD-10-CM | POA: Diagnosis not present

## 2021-05-10 DIAGNOSIS — J45909 Unspecified asthma, uncomplicated: Secondary | ICD-10-CM | POA: Diagnosis not present

## 2021-05-31 DIAGNOSIS — J45909 Unspecified asthma, uncomplicated: Secondary | ICD-10-CM | POA: Diagnosis not present

## 2021-06-10 DIAGNOSIS — J45909 Unspecified asthma, uncomplicated: Secondary | ICD-10-CM | POA: Diagnosis not present

## 2021-06-23 DIAGNOSIS — J45909 Unspecified asthma, uncomplicated: Secondary | ICD-10-CM | POA: Diagnosis not present

## 2021-07-11 DIAGNOSIS — J45909 Unspecified asthma, uncomplicated: Secondary | ICD-10-CM | POA: Diagnosis not present

## 2021-08-01 DIAGNOSIS — J45909 Unspecified asthma, uncomplicated: Secondary | ICD-10-CM | POA: Diagnosis not present

## 2021-08-10 DIAGNOSIS — J45909 Unspecified asthma, uncomplicated: Secondary | ICD-10-CM | POA: Diagnosis not present

## 2021-08-28 DIAGNOSIS — J45909 Unspecified asthma, uncomplicated: Secondary | ICD-10-CM | POA: Diagnosis not present

## 2021-09-10 DIAGNOSIS — J45909 Unspecified asthma, uncomplicated: Secondary | ICD-10-CM | POA: Diagnosis not present

## 2021-09-21 DIAGNOSIS — J45909 Unspecified asthma, uncomplicated: Secondary | ICD-10-CM | POA: Diagnosis not present

## 2021-10-10 DIAGNOSIS — J45909 Unspecified asthma, uncomplicated: Secondary | ICD-10-CM | POA: Diagnosis not present

## 2021-10-16 DIAGNOSIS — J45909 Unspecified asthma, uncomplicated: Secondary | ICD-10-CM | POA: Diagnosis not present

## 2021-11-10 DIAGNOSIS — J45909 Unspecified asthma, uncomplicated: Secondary | ICD-10-CM | POA: Diagnosis not present

## 2021-12-01 DIAGNOSIS — J45909 Unspecified asthma, uncomplicated: Secondary | ICD-10-CM | POA: Diagnosis not present

## 2021-12-11 DIAGNOSIS — J45909 Unspecified asthma, uncomplicated: Secondary | ICD-10-CM | POA: Diagnosis not present

## 2021-12-22 DIAGNOSIS — J45909 Unspecified asthma, uncomplicated: Secondary | ICD-10-CM | POA: Diagnosis not present

## 2022-01-19 DIAGNOSIS — J45909 Unspecified asthma, uncomplicated: Secondary | ICD-10-CM | POA: Diagnosis not present

## 2022-02-16 DIAGNOSIS — J45909 Unspecified asthma, uncomplicated: Secondary | ICD-10-CM | POA: Diagnosis not present

## 2022-02-19 ENCOUNTER — Encounter: Payer: Self-pay | Admitting: Family Medicine

## 2022-02-19 ENCOUNTER — Ambulatory Visit (INDEPENDENT_AMBULATORY_CARE_PROVIDER_SITE_OTHER): Payer: Medicare Other | Admitting: Family Medicine

## 2022-02-19 VITALS — BP 138/86 | HR 80 | Ht 61.0 in | Wt 121.0 lb

## 2022-02-19 DIAGNOSIS — M25551 Pain in right hip: Secondary | ICD-10-CM | POA: Diagnosis not present

## 2022-02-19 DIAGNOSIS — J4540 Moderate persistent asthma, uncomplicated: Secondary | ICD-10-CM

## 2022-02-19 DIAGNOSIS — G8929 Other chronic pain: Secondary | ICD-10-CM | POA: Diagnosis not present

## 2022-02-19 DIAGNOSIS — G2581 Restless legs syndrome: Secondary | ICD-10-CM | POA: Diagnosis not present

## 2022-02-19 DIAGNOSIS — I10 Essential (primary) hypertension: Secondary | ICD-10-CM

## 2022-02-19 DIAGNOSIS — M545 Low back pain, unspecified: Secondary | ICD-10-CM | POA: Diagnosis not present

## 2022-02-19 DIAGNOSIS — E782 Mixed hyperlipidemia: Secondary | ICD-10-CM

## 2022-02-19 DIAGNOSIS — M25552 Pain in left hip: Secondary | ICD-10-CM | POA: Diagnosis not present

## 2022-02-19 DIAGNOSIS — G894 Chronic pain syndrome: Secondary | ICD-10-CM

## 2022-02-19 MED ORDER — BACLOFEN 10 MG PO TABS: 5.0000 mg | ORAL_TABLET | Freq: Three times a day (TID) | ORAL | 2 refills | Status: DC | PRN

## 2022-02-19 MED ORDER — ALBUTEROL SULFATE HFA 108 (90 BASE) MCG/ACT IN AERS: 2.0000 | INHALATION_SPRAY | Freq: Four times a day (QID) | RESPIRATORY_TRACT | 3 refills | Status: DC | PRN

## 2022-02-19 MED ORDER — GABAPENTIN 300 MG PO CAPS: 300.0000 mg | ORAL_CAPSULE | Freq: Every day | ORAL | 3 refills | Status: DC

## 2022-02-19 MED ORDER — ROSUVASTATIN CALCIUM 20 MG PO TABS: 20.0000 mg | ORAL_TABLET | Freq: Every day | ORAL | 3 refills | Status: DC

## 2022-02-19 MED ORDER — SYMBICORT 160-4.5 MCG/ACT IN AERO: 2.0000 | INHALATION_SPRAY | Freq: Two times a day (BID) | RESPIRATORY_TRACT | 12 refills | Status: DC

## 2022-02-19 MED ORDER — VALSARTAN 80 MG PO TABS: 80.0000 mg | ORAL_TABLET | Freq: Every day | ORAL | 3 refills | Status: DC
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
## 2022-02-19 NOTE — Patient Instructions (Addendum)
Thank you for coming to the office today. ? ?Restart medications. ? ?Jury Duty Letter ? ?DUE for FASTING BLOOD WORK (no food or drink after midnight before the lab appointment, only water or coffee without cream/sugar on the morning of) ? ?SCHEDULE "Lab Only" visit in the morning at the clinic for lab draw in 6 MONTHS   ? ?- Make sure Lab Only appointment is at about 1 week before your next appointment, so that results will be available ? ?For Lab Results, once available within 2-3 days of blood draw, you can can log in to MyChart online to view your results and a brief explanation. Also, we can discuss results at next follow-up visit. ? ? ?Please schedule a Follow-up Appointment to: Return in about 6 months (around 08/21/2022) for 6 month follow-up HTN, Asthma, labs in AM. ? ?If you have any other questions or concerns, please feel free to call the office or send a message through MyChart. You may also schedule an earlier appointment if necessary. ? ?Additionally, you may be receiving a survey about your experience at our office within a few days to 1 week by e-mail or mail. We value your feedback. ? ?Saralyn Pilar, DO ?[facility], [place] ?

## 2022-03-27 DIAGNOSIS — J45909 Unspecified asthma, uncomplicated: Secondary | ICD-10-CM | POA: Diagnosis not present

## 2022-04-10 ENCOUNTER — Ambulatory Visit: Payer: Medicare Other

## 2022-05-01 ENCOUNTER — Encounter: Payer: Self-pay | Admitting: Family Medicine

## 2022-05-01 ENCOUNTER — Other Ambulatory Visit: Payer: Self-pay | Admitting: Family Medicine

## 2022-05-01 ENCOUNTER — Ambulatory Visit (INDEPENDENT_AMBULATORY_CARE_PROVIDER_SITE_OTHER): Payer: Medicare Other | Admitting: Family Medicine

## 2022-05-01 VITALS — BP 148/84 | HR 77 | Ht 61.0 in | Wt 118.0 lb

## 2022-05-01 DIAGNOSIS — Z23 Encounter for immunization: Secondary | ICD-10-CM

## 2022-05-01 DIAGNOSIS — I10 Essential (primary) hypertension: Secondary | ICD-10-CM

## 2022-05-01 DIAGNOSIS — G2581 Restless legs syndrome: Secondary | ICD-10-CM | POA: Diagnosis not present

## 2022-05-01 DIAGNOSIS — Z0000 Encounter for general adult medical examination without abnormal findings: Secondary | ICD-10-CM | POA: Diagnosis not present

## 2022-05-01 DIAGNOSIS — E782 Mixed hyperlipidemia: Secondary | ICD-10-CM | POA: Diagnosis not present

## 2022-05-01 DIAGNOSIS — J3089 Other allergic rhinitis: Secondary | ICD-10-CM

## 2022-05-01 DIAGNOSIS — R7309 Other abnormal glucose: Secondary | ICD-10-CM | POA: Diagnosis not present

## 2022-05-01 DIAGNOSIS — J4540 Moderate persistent asthma, uncomplicated: Secondary | ICD-10-CM

## 2022-05-01 DIAGNOSIS — F5101 Primary insomnia: Secondary | ICD-10-CM

## 2022-05-01 MED ORDER — TRAZODONE HCL 50 MG PO TABS: 50.0000 mg | ORAL_TABLET | Freq: Every evening | ORAL | 3 refills | Status: DC | PRN

## 2022-05-01 MED ORDER — VALSARTAN 160 MG PO TABS: 160.0000 mg | ORAL_TABLET | Freq: Every day | ORAL | 3 refills | Status: DC

## 2022-05-01 MED ORDER — MONTELUKAST SODIUM 10 MG PO TABS: 10.0000 mg | ORAL_TABLET | Freq: Every day | ORAL | 3 refills | Status: DC

## 2022-05-01 MED ORDER — FEXOFENADINE HCL 180 MG PO TABS: 180.0000 mg | ORAL_TABLET | Freq: Every day | ORAL | 3 refills | Status: DC
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
## 2022-05-02 LAB — CBC WITH DIFFERENTIAL/PLATELET
Absolute Monocytes: 533 cells/uL (ref 200–950)
Basophils Absolute: 26 cells/uL (ref 0–200)
Basophils Relative: 0.3 %
Eosinophils Absolute: 112 cells/uL (ref 15–500)
Eosinophils Relative: 1.3 %
HCT: 39.2 % (ref 35.0–45.0)
Hemoglobin: 12.4 g/dL (ref 11.7–15.5)
Lymphs Abs: 2417 cells/uL (ref 850–3900)
MCH: 25.9 pg — ABNORMAL LOW (ref 27.0–33.0)
MCHC: 31.6 g/dL — ABNORMAL LOW (ref 32.0–36.0)
MCV: 82 fL (ref 80.0–100.0)
MPV: 10.9 fL (ref 7.5–12.5)
Monocytes Relative: 6.2 %
Neutro Abs: 5513 cells/uL (ref 1500–7800)
Neutrophils Relative %: 64.1 %
Platelets: 319 10*3/uL (ref 140–400)
RBC: 4.78 10*6/uL (ref 3.80–5.10)
RDW: 15.1 % — ABNORMAL HIGH (ref 11.0–15.0)
Total Lymphocyte: 28.1 %
WBC: 8.6 10*3/uL (ref 3.8–10.8)

## 2022-05-02 LAB — COMPLETE METABOLIC PANEL WITH GFR
AG Ratio: 1.8 (calc) (ref 1.0–2.5)
ALT: 13 U/L (ref 6–29)
AST: 19 U/L (ref 10–35)
Albumin: 4.1 g/dL (ref 3.6–5.1)
Alkaline phosphatase (APISO): 68 U/L (ref 37–153)
BUN: 17 mg/dL (ref 7–25)
CO2: 26 mmol/L (ref 20–32)
Calcium: 9.3 mg/dL (ref 8.6–10.4)
Chloride: 109 mmol/L (ref 98–110)
Creat: 0.97 mg/dL (ref 0.60–1.00)
Globulin: 2.3 g/dL (calc) (ref 1.9–3.7)
Glucose, Bld: 86 mg/dL (ref 65–139)
Potassium: 4.6 mmol/L (ref 3.5–5.3)
Sodium: 143 mmol/L (ref 135–146)
Total Bilirubin: 0.3 mg/dL (ref 0.2–1.2)
Total Protein: 6.4 g/dL (ref 6.1–8.1)
eGFR: 61 mL/min/{1.73_m2} (ref >=60)

## 2022-05-02 LAB — LIPID PANEL
Cholesterol: 211 mg/dL — ABNORMAL HIGH (ref <200)
HDL: 53 mg/dL (ref >=50)
LDL Cholesterol (Calc): 141 mg/dL (calc) — ABNORMAL HIGH
Non-HDL Cholesterol (Calc): 158 mg/dL (calc) — ABNORMAL HIGH (ref <130)
Total CHOL/HDL Ratio: 4 (calc) (ref <5.0)
Triglycerides: 73 mg/dL (ref <150)

## 2022-05-02 LAB — HEMOGLOBIN A1C
Hgb A1c MFr Bld: 5.6 % of total Hgb (ref <5.7)
Mean Plasma Glucose: 114 mg/dL
eAG (mmol/L): 6.3 mmol/L

## 2022-05-04 DIAGNOSIS — J45909 Unspecified asthma, uncomplicated: Secondary | ICD-10-CM | POA: Diagnosis not present

## 2022-05-11 ENCOUNTER — Ambulatory Visit (INDEPENDENT_AMBULATORY_CARE_PROVIDER_SITE_OTHER): Payer: Medicare Other

## 2022-05-11 VITALS — BP 130/90 | Ht 61.0 in | Wt 122.0 lb

## 2022-05-11 DIAGNOSIS — Z0000 Encounter for general adult medical examination without abnormal findings: Secondary | ICD-10-CM | POA: Diagnosis not present

## 2022-05-11 NOTE — Progress Notes (Signed)
Subjective:   Kirsten [name] is a 76 y.o. female who presents for Medicare Annual (Subsequent) preventive examination.  Review of Systems           Objective:    Today's Vitals   05/11/22 0938  BP: 130/90  Weight: 122 lb (55.3 kg)  Height: '5\' 1"'  (1.549 m)   Body mass index is 23.05 kg/m.     05/02/2021    9:42 AM 03/29/2020   11:18 AM 02/13/2020   12:57 PM 04/15/2018   10:59 AM 10/03/2017   11:31 AM 09/30/2017    8:27 AM 06/17/2017    7:42 AM  Advanced Directives  Does Patient Have a Medical Advance Directive? No No No No No No No  Would patient like information on creating a medical advance directive?  Yes (MAU/Ambulatory/Procedural Areas - Information given) No - Patient declined No - Patient declined No - Patient declined No - Patient declined No - Patient declined    Current Medications (verified) Outpatient Encounter Medications as of 05/11/2022  Medication Sig   albuterol (PROVENTIL HFA) 108 (90 Base) MCG/ACT inhaler Inhale 2 puffs into the lungs every 6 (six) hours as needed for wheezing or shortness of breath.   baclofen (LIORESAL) 10 MG tablet Take 0.5-1 tablets (5-10 mg total) by mouth 3 (three) times daily as needed for muscle spasms.   fexofenadine (ALLEGRA) 180 MG tablet Take 1 tablet (180 mg total) by mouth daily.   Respir Rate Dev-Bld Press Mon (RESPERATE 1.0) KIT Use daily to help lower blood pressure   rosuvastatin (CRESTOR) 20 MG tablet Take 1 tablet (20 mg total) by mouth at bedtime.   traZODone (DESYREL) 50 MG tablet Take 1 tablet (50 mg total) by mouth at bedtime as needed for sleep.   valsartan (DIOVAN) 160 MG tablet Take 1 tablet (160 mg total) by mouth daily.   aspirin EC 81 MG tablet Take 1 tablet (81 mg total) by mouth daily. (Patient not taking: Reported on 05/11/2022)   fluticasone (FLONASE) 50 MCG/ACT nasal spray Place 2 sprays into both nostrils daily. Use for 4-6 weeks then stop and use seasonally or as needed. (Patient not taking: Reported on  05/11/2022)   furosemide (LASIX) 20 MG tablet TAKE 1/2 TO 1 TABLET(10 TO 20 MG) BY MOUTH DAILY AS NEEDED FOR FLUID RETENTION (Patient not taking: Reported on 05/11/2022)   gabapentin (NEURONTIN) 300 MG capsule Take 1 capsule (300 mg total) by mouth at bedtime. (Patient not taking: Reported on 05/11/2022)   montelukast (SINGULAIR) 10 MG tablet Take 1 tablet (10 mg total) by mouth at bedtime. (Patient not taking: Reported on 05/11/2022)   SYMBICORT 160-4.5 MCG/ACT inhaler Inhale 2 puffs into the lungs in the morning and at bedtime. (Patient not taking: Reported on 05/11/2022)   No facility-administered encounter medications on file as of 05/11/2022.    Allergies (verified) Hydrochlorothiazide   History: Past Medical History:  Diagnosis Date   Allergy    Arthritis    Asthma    Back injury    Heart disease    History of blood clots    History of chicken pox    Hyperlipidemia    Hypertension    Memory loss    Migraine headache    Myocardial infarction [facility])    Past Surgical History:  Procedure Laterality Date   ABDOMINAL HYSTERECTOMY     APPENDECTOMY     BOWEL RESECTION     COLONOSCOPY WITH PROPOFOL N/A 06/17/2017   Procedure: COLONOSCOPY WITH PROPOFOL;  Surgeon: Lollie Sails, MD;  Location: [facility] ENDOSCOPY;  Service: Endoscopy;  Laterality: N/A;   Family History  Problem Relation Age of Onset   Cancer Father        unknown type   Social History   Socioeconomic History   Marital status: Widowed    Spouse name: Not on file   Number of children: Not on file   Years of education: Not on file   Highest education level: Not on file  Occupational History   Not on file  Tobacco Use   Smoking status: Never   Smokeless tobacco: Never  Vaping Use   Vaping Use: Never used  Substance and Sexual Activity   Alcohol use: No    Alcohol/week: 0.0 standard drinks of alcohol   Drug use: No   Sexual activity: Not on file  Other Topics Concern   Not on file  Social History Narrative    Not on file   Social Determinants of Health   Financial Resource Strain: Low Risk  (05/02/2021)   Overall Financial Resource Strain (CARDIA)    Difficulty of Paying Living Expenses: Not hard at all  Food Insecurity: No Food Insecurity (05/02/2021)   Hunger Vital Sign    Worried About Running Out of Food in the Last Year: Never true    [place] in the Last Year: Never true  Transportation Needs: No Transportation Needs (05/02/2021)   PRAPARE - Hydrologist (Medical): No    Lack of Transportation (Non-Medical): No  Physical Activity: Insufficiently Active (05/11/2022)   Exercise Vital Sign    Days of Exercise per Week: 2 days    Minutes of Exercise per Session: 20 min  Stress: No Stress Concern Present (05/11/2022)   [place]    Feeling of Stress : Not at all  Social Connections: Moderately Isolated (03/29/2020)   Social Connection and Isolation Panel [NHANES]    Frequency of Communication with Friends and Family: More than three times a week    Frequency of Social Gatherings with Friends and Family: More than three times a week    Attends Religious Services: More than 4 times per year    Active Member of [employer] or Organizations: No    Attends Archivist Meetings: Never    Marital Status: Widowed    Tobacco Counseling Counseling given: Not Answered   Clinical Intake:  Pre-visit preparation completed: Yes  Pain : No/denies pain     Nutritional Risks: None Diabetes: No  How often do you need to have someone help you when you read instructions, pamphlets, or other written materials from your doctor or pharmacy?: 1 - Never  Diabetic?no  Interpreter Needed?: No  Information entered by :: Kirke Shaggy, LPN   Activities of Daily Living     No data to display          Patient Care Team: Olin Hauser, DO as PCP - General (Family Medicine) Greg Cutter, LCSW as Social Worker (Licensed Clinical Social Worker)  Indicate any recent [employer] you may have received from other than [facility] providers in the past year (date may be approximate).     Assessment:   This is a routine wellness examination for [facility].  Hearing/Vision screen No results found.  Dietary issues and exercise activities discussed:     Goals Addressed             This  Visit's Progress    DIET - EAT MORE FRUITS AND VEGETABLES         Depression Screen    05/11/2022    9:43 AM 02/19/2022   10:37 AM 05/02/2021    9:42 AM 04/26/2021    9:07 AM 06/08/2020   11:17 AM 04/13/2020   11:34 AM 03/29/2020   11:22 AM  PHQ 2/9 Scores  PHQ - 2 Score 0 0 0 0 0 0 1  PHQ- 9 Score 0 '7  8 1 1     ' Fall Risk    02/19/2022   10:37 AM 05/02/2021    9:42 AM 04/26/2021    9:07 AM 04/13/2020   11:20 AM 03/29/2020   11:21 AM  Fall Risk   Falls in the past year? 0 0 0 0 0  Number falls in past yr: 0  0 0 0  Injury with Fall? 0  0 0 0  Risk for fall due to : No Fall Risks Medication side effect     Follow up Falls evaluation completed Falls evaluation completed;Education provided;Falls prevention discussed Falls evaluation completed Falls evaluation completed     FALL RISK PREVENTION PERTAINING TO THE HOME:  Any stairs in or around the home? No  If so, are there any without handrails? No  Home free of loose throw rugs in walkways, pet beds, electrical cords, etc? Yes  Adequate lighting in your home to reduce risk of falls? Yes   ASSISTIVE DEVICES UTILIZED TO PREVENT FALLS:  Life alert? No  Use of a cane, walker or w/c? Yes  Grab bars in the bathroom? No  Shower chair or bench in shower? Yes  Elevated toilet seat or a handicapped toilet? No   TIMED UP AND GO:  Was the test performed? Yes .  Length of time to ambulate 10 feet: 4 sec.   Gait steady and fast without use of assistive device  Cognitive Function:        04/15/2018   10:59 AM 04/09/2017   10:44 AM   6CIT Screen  What Year? 0 points 0 points  What month? 0 points 0 points  What time? 0 points 0 points  Count back from 20 0 points 0 points  Months in reverse 0 points 4 points  Repeat phrase 0 points 6 points  Total Score 0 points 10 points    Immunizations Immunization History  Administered Date(s) Administered   Fluad Quad(high Dose 65+) 11/09/2019   Influenza, High Dose Seasonal PF 09/20/2015, 09/15/2018, 07/21/2019   Influenza-Unspecified 08/23/2017, 12/10/2017   Moderna Sars-Covid-2 Vaccination 04/18/2021   PFIZER(Purple Top)SARS-COV-2 Vaccination 12/19/2019, 01/09/2020, 08/22/2020   Pneumococcal Conjugate-13 08/16/2014   Pneumococcal Polysaccharide-23 02/10/2014   Tdap 02/10/2014   Zoster Recombinat (Shingrix) 11/09/2019, 05/01/2022    TDAP status: Up to date  Flu Vaccine status: Declined, Education has been provided regarding the importance of this vaccine but patient still declined. Advised may receive this vaccine at local pharmacy or Health Dept. Aware to provide a copy of the vaccination record if obtained from local pharmacy or Health Dept. Verbalized acceptance and understanding.  Pneumococcal vaccine status: Up to date  Covid-19 vaccine status: Completed vaccines  Qualifies for Shingles Vaccine? Yes   Zostavax completed No   Shingrix Completed?: Yes  Screening Tests Health Maintenance  Topic Date Due   COVID-19 Vaccine (5 - Pfizer series) 06/13/2021   MAMMOGRAM  05/02/2023 (Originally 09/19/2012)   INFLUENZA VACCINE  06/05/2022   TETANUS/TDAP  09/05/2024   COLONOSCOPY (Pts  45-54yr Insurance coverage will need to be confirmed)  06/18/2027   Pneumonia Vaccine 76 Years old  Completed   DEXA SCAN  Completed   Hepatitis C Screening  Completed   Zoster Vaccines- Shingrix  Completed   HPV VACCINES  Aged Out    Health Maintenance  Health Maintenance Due  Topic Date Due   COVID-19 Vaccine (5 - Pfizer series) 06/13/2021    Colorectal cancer  screening: Type of screening: Colonoscopy. Completed 06/17/17. Repeat every 10 years  Mammogram status: No longer required due to age.  Bone Density status: Completed 07/06/14. Results reflect: Bone density results: NORMAL. Repeat every 5 years.- declined referral  Lung Cancer Screening: (Low Dose CT Chest recommended if Age 74-80years, 30 pack-year currently smoking OR have quit w/in 15years.) does not qualify.   Additional Screening:  Hepatitis C Screening: does qualify; Completed 09/20/15  Vision Screening: Recommended annual ophthalmology exams for early detection of glaucoma and other disorders of the eye. Is the patient up to date with their annual eye exam?  Yes  Who is the provider or what is the name of the office in which the patient attends annual eye exams? [facility] [place] pt is not established with a provider, would they like to be referred to a provider to establish care? No .   Dental Screening: Recommended annual dental exams for proper oral hygiene  Community Resource Referral / Chronic Care Management: CRR required this visit?  No   CCM required this visit?  No      Plan:     I have personally reviewed and noted the following in the patient's chart:   Medical and social history Use of alcohol, tobacco or illicit drugs  Current medications and supplements including opioid prescriptions.  Functional ability and status Nutritional status Physical activity Advanced directives List of other physicians Hospitalizations, surgeries, and ER visits in previous 12 months Vitals Screenings to include cognitive, depression, and falls Referrals and appointments  In addition, I have reviewed and discussed with patient certain preventive protocols, quality metrics, and best practice recommendations. A written personalized care plan for preventive services as well as general preventive health recommendations were provided to patient.     LDionisio David LPN   78/12/811   Nurse Notes: none

## 2022-05-11 NOTE — Patient Instructions (Signed)
Kirsten [name] , Thank you for taking time to come for your Medicare Wellness Visit. I appreciate your ongoing commitment to your health goals. Please review the following plan we discussed and let me know if I can assist you in the future.   Screening recommendations/referrals: Colonoscopy: 06/17/17 Mammogram: aged out Bone Density: 07/06/14, declined referral Recommended yearly ophthalmology/optometry visit for glaucoma screening and checkup Recommended yearly dental visit for hygiene and checkup  Vaccinations: Influenza vaccine: 11/09/19 Pneumococcal vaccine: 08/16/14 Tdap vaccine: 09/05/14 Shingles vaccine: Shingrix 11/09/19, 05/01/22   Covid-19:12/19/19, 01/09/20, 08/22/20, 04/18/21  Advanced directives: no  Conditions/risks identified: none  Next appointment: Follow up in one year for your annual wellness visit 05/17/23 @ 10am in person   Preventive Care 65 Years and Older, Female Preventive care refers to lifestyle choices and visits with your health care provider that can promote health and wellness. What does preventive care include? A yearly physical exam. This is also called an annual well check. Dental exams once or twice a year. Routine eye exams. Ask your health care provider how often you should have your eyes checked. Personal lifestyle choices, including: Daily care of your teeth and gums. Regular physical activity. Eating a healthy diet. Avoiding tobacco and drug use. Limiting alcohol use. Practicing safe sex. Taking low-dose aspirin every day. Taking vitamin and mineral supplements as recommended by your health care provider. What happens during an annual well check? The services and screenings done by your health care provider during your annual well check will depend on your age, overall health, lifestyle risk factors, and family history of disease. Counseling  Your health care provider may ask you questions about your: Alcohol use. Tobacco use. Drug use. Emotional  well-being. Home and relationship well-being. Sexual activity. Eating habits. History of falls. Memory and ability to understand (cognition). Work and work Astronomer. Reproductive health. Screening  You may have the following tests or measurements: Height, weight, and BMI. Blood pressure. Lipid and cholesterol levels. These may be checked every 5 years, or more frequently if you are over 71 years old. Skin check. Lung cancer screening. You may have this screening every year starting at age 61 if you have a 30-pack-year history of smoking and currently smoke or have quit within the past 15 years. Fecal occult blood test (FOBT) of the stool. You may have this test every year starting at age 45. Flexible sigmoidoscopy or colonoscopy. You may have a sigmoidoscopy every 5 years or a colonoscopy every 10 years starting at age 53. Hepatitis C blood test. Hepatitis B blood test. Sexually transmitted disease (STD) testing. Diabetes screening. This is done by checking your blood sugar (glucose) after you have not eaten for a while (fasting). You may have this done every 1-3 years. Bone density scan. This is done to screen for osteoporosis. You may have this done starting at age 109. Mammogram. This may be done every 1-2 years. Talk to your health care provider about how often you should have regular mammograms. Talk with your health care provider about your test results, treatment options, and if necessary, the need for more tests. Vaccines  Your health care provider may recommend certain vaccines, such as: Influenza vaccine. This is recommended every year. Tetanus, diphtheria, and acellular pertussis (Tdap, Td) vaccine. You may need a Td booster every 10 years. Zoster vaccine. You may need this after age 57. Pneumococcal 13-valent conjugate (PCV13) vaccine. One dose is recommended after age 19. Pneumococcal polysaccharide (PPSV23) vaccine. One dose is recommended after age 60.  Talk to your  health care provider about which screenings and vaccines you need and how often you need them. This information is not intended to replace advice given to you by your health care provider. Make sure you discuss any questions you have with your health care provider. Document Released: 11/18/2015 Document Revised: 07/11/2016 Document Reviewed: 08/23/2015 Elsevier Interactive Patient Education  2017 [employer] Prevention in the Home Falls can cause injuries. They can happen to people of all ages. There are many things you can do to make your home safe and to help prevent falls. What can I do on the outside of my home? Regularly fix the edges of walkways and driveways and fix any cracks. Remove anything that might make you trip as you walk through a door, such as a raised step or threshold. Trim any bushes or trees on the path to your home. Use bright outdoor lighting. Clear any walking paths of anything that might make someone trip, such as rocks or tools. Regularly check to see if handrails are loose or broken. Make sure that both sides of any steps have handrails. Any raised decks and porches should have guardrails on the edges. Have any leaves, snow, or ice cleared regularly. Use sand or salt on walking paths during winter. Clean up any spills in your garage right away. This includes oil or grease spills. What can I do in the bathroom? Use night lights. Install grab bars by the toilet and in the tub and shower. Do not use towel bars as grab bars. Use non-skid mats or decals in the tub or shower. If you need to sit down in the shower, use a plastic, non-slip stool. Keep the floor dry. Clean up any water that spills on the floor as soon as it happens. Remove soap buildup in the tub or shower regularly. Attach bath mats securely with double-sided non-slip rug tape. Do not have throw rugs and other things on the floor that can make you trip. What can I do in the bedroom? Use night  lights. Make sure that you have a light by your bed that is easy to reach. Do not use any sheets or blankets that are too big for your bed. They should not hang down onto the floor. Have a firm chair that has side arms. You can use this for support while you get dressed. Do not have throw rugs and other things on the floor that can make you trip. What can I do in the kitchen? Clean up any spills right away. Avoid walking on wet floors. Keep items that you use a lot in easy-to-reach places. If you need to reach something above you, use a strong step stool that has a grab bar. Keep electrical cords out of the way. Do not use floor polish or wax that makes floors slippery. If you must use wax, use non-skid floor wax. Do not have throw rugs and other things on the floor that can make you trip. What can I do with my stairs? Do not leave any items on the stairs. Make sure that there are handrails on both sides of the stairs and use them. Fix handrails that are broken or loose. Make sure that handrails are as long as the stairways. Check any carpeting to make sure that it is firmly attached to the stairs. Fix any carpet that is loose or worn. Avoid having throw rugs at the top or bottom of the stairs. If you do have throw  rugs, attach them to the floor with carpet tape. Make sure that you have a light switch at the top of the stairs and the bottom of the stairs. If you do not have them, ask someone to add them for you. What else can I do to help prevent falls? Wear shoes that: Do not have high heels. Have rubber bottoms. Are comfortable and fit you well. Are closed at the toe. Do not wear sandals. If you use a stepladder: Make sure that it is fully opened. Do not climb a closed stepladder. Make sure that both sides of the stepladder are locked into place. Ask someone to hold it for you, if possible. Clearly mark and make sure that you can see: Any grab bars or handrails. First and last  steps. Where the edge of each step is. Use tools that help you move around (mobility aids) if they are needed. These include: Canes. Walkers. Scooters. Crutches. Turn on the lights when you go into a dark area. Replace any light bulbs as soon as they burn out. Set up your furniture so you have a clear path. Avoid moving your furniture around. If any of your floors are uneven, fix them. If there are any pets around you, be aware of where they are. Review your medicines with your doctor. Some medicines can make you feel dizzy. This can increase your chance of falling. Ask your doctor what other things that you can do to help prevent falls. This information is not intended to replace advice given to you by your health care provider. Make sure you discuss any questions you have with your health care provider. Document Released: 08/18/2009 Document Revised: 03/29/2016 Document Reviewed: 11/26/2014 Elsevier Interactive Patient Education  2017 [employer].

## 2022-05-15 ENCOUNTER — Ambulatory Visit: Payer: Medicare Other

## 2022-05-31 DIAGNOSIS — J45909 Unspecified asthma, uncomplicated: Secondary | ICD-10-CM | POA: Diagnosis not present

## 2022-06-12 DIAGNOSIS — H5213 Myopia, bilateral: Secondary | ICD-10-CM | POA: Diagnosis not present

## 2022-06-12 DIAGNOSIS — H2513 Age-related nuclear cataract, bilateral: Secondary | ICD-10-CM | POA: Diagnosis not present

## 2022-06-28 DIAGNOSIS — J45909 Unspecified asthma, uncomplicated: Secondary | ICD-10-CM | POA: Diagnosis not present

## 2022-07-24 DIAGNOSIS — H524 Presbyopia: Secondary | ICD-10-CM | POA: Diagnosis not present

## 2022-07-25 DIAGNOSIS — J45909 Unspecified asthma, uncomplicated: Secondary | ICD-10-CM | POA: Diagnosis not present

## 2022-08-17 DIAGNOSIS — J45909 Unspecified asthma, uncomplicated: Secondary | ICD-10-CM | POA: Diagnosis not present

## 2022-08-21 ENCOUNTER — Encounter: Payer: Self-pay | Admitting: Family Medicine

## 2022-08-21 ENCOUNTER — Other Ambulatory Visit: Payer: Self-pay | Admitting: Family Medicine

## 2022-08-21 ENCOUNTER — Ambulatory Visit (INDEPENDENT_AMBULATORY_CARE_PROVIDER_SITE_OTHER): Payer: Medicare Other | Admitting: Family Medicine

## 2022-08-21 VITALS — BP 138/82 | HR 64 | Ht 61.0 in | Wt 120.6 lb

## 2022-08-21 DIAGNOSIS — R7309 Other abnormal glucose: Secondary | ICD-10-CM

## 2022-08-21 DIAGNOSIS — M25552 Pain in left hip: Secondary | ICD-10-CM

## 2022-08-21 DIAGNOSIS — E782 Mixed hyperlipidemia: Secondary | ICD-10-CM

## 2022-08-21 DIAGNOSIS — M159 Polyosteoarthritis, unspecified: Secondary | ICD-10-CM | POA: Diagnosis not present

## 2022-08-21 DIAGNOSIS — I10 Essential (primary) hypertension: Secondary | ICD-10-CM

## 2022-08-21 DIAGNOSIS — G8929 Other chronic pain: Secondary | ICD-10-CM

## 2022-08-21 DIAGNOSIS — Z0000 Encounter for general adult medical examination without abnormal findings: Secondary | ICD-10-CM

## 2022-08-21 DIAGNOSIS — M545 Low back pain, unspecified: Secondary | ICD-10-CM

## 2022-08-21 DIAGNOSIS — F3342 Major depressive disorder, recurrent, in full remission: Secondary | ICD-10-CM | POA: Diagnosis not present

## 2022-08-21 DIAGNOSIS — M25551 Pain in right hip: Secondary | ICD-10-CM

## 2022-08-21 MED ORDER — TIZANIDINE HCL 4 MG PO TABS: 4.0000 mg | ORAL_TABLET | Freq: Three times a day (TID) | ORAL | 2 refills | Status: DC | PRN
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
## 2022-08-21 NOTE — Patient Instructions (Addendum)
Thank you for coming to the office today.  Stop taking Baclofen Switch to Tizanidine muscle relaxant  You have refills of meds until April 2024  Updated on vaccines.  BP repeat today  DUE for FASTING BLOOD WORK (no food or drink after midnight before the lab appointment, only water or coffee without cream/sugar on the morning of)  SCHEDULE "Lab Only" visit in the morning at the clinic for lab draw in 6  MONTHS   - Make sure Lab Only appointment is at about 1 week before your next appointment, so that results will be available  For Lab Results, once available within 2-3 days of blood draw, you can can log in to MyChart online to view your results and a brief explanation. Also, we can discuss results at next follow-up visit.   Please schedule a Follow-up Appointment to: Return in about 6 months (around 02/20/2023) for 6 month fasting lab only then 1 week later Annual Physical.  If you have any other questions or concerns, please feel free to call the office or send a message through [place]. You may also schedule an earlier appointment if necessary.  Additionally, you may be receiving a survey about your experience at our office within a few days to 1 week by e-mail or mail. We value your feedback.  Nobie Putnam, DO [place]

## 2022-09-07 DIAGNOSIS — J45909 Unspecified asthma, uncomplicated: Secondary | ICD-10-CM | POA: Diagnosis not present

## 2022-09-20 ENCOUNTER — Other Ambulatory Visit: Payer: Self-pay | Admitting: Family Medicine

## 2022-09-20 DIAGNOSIS — G8929 Other chronic pain: Secondary | ICD-10-CM

## 2022-09-20 NOTE — Telephone Encounter (Signed)
Requested medication (s) are due for refill today: routing fir review  Requested medication (s) are on the active medication list:yes  Last refill:  08/21/22  Future visit scheduled: yes  Notes to clinic:  Unable to refill per protocol, cannot delegate.      Requested Prescriptions  Pending Prescriptions Disp Refills   tiZANidine (ZANAFLEX) 4 MG tablet [Pharmacy Med Name: TIZANIDINE 4MG  TABLETS] 30 tablet 2    Sig: TAKE 1 TABLET(4 MG) BY MOUTH EVERY 8 HOURS AS NEEDED FOR MUSCLE SPASMS     Not Delegated - Cardiovascular:  Alpha-2 Agonists - tizanidine Failed - 09/20/2022  3:38 AM      Failed - This refill cannot be delegated      Passed - Valid encounter within last 6 months    Recent Outpatient Visits           1 month ago Primary osteoarthritis involving multiple joints   [facility] [place], [place], [place]   4 months ago Annual physical exam   [facility], DO   7 months ago Moderate persistent asthma without complication   [facility] [place], [place], DO   1 year ago Moderate persistent asthma without complication   [facility], DO   2 years ago Essential hypertension   [facility] [place], [place], DO       Future Appointments             In 1 month Netta Neat, Althea Charon, DO [facility], [facility]   In 5 months [facility], Althea Charon, DO [facility] [place], [facility]

## 2022-10-02 DIAGNOSIS — J45909 Unspecified asthma, uncomplicated: Secondary | ICD-10-CM | POA: Diagnosis not present

## 2022-10-22 ENCOUNTER — Other Ambulatory Visit: Payer: Self-pay | Admitting: Family Medicine

## 2022-10-22 DIAGNOSIS — G8929 Other chronic pain: Secondary | ICD-10-CM

## 2022-10-22 DIAGNOSIS — M545 Low back pain, unspecified: Secondary | ICD-10-CM

## 2022-10-23 NOTE — Telephone Encounter (Signed)
Requested medication (s) are due for refill today -provider review   Requested medication (s) are on the active medication list -yes  Future visit scheduled -yes  Last refill: 09/20/22 #30 2RF  Notes to clinic: non delegated Rx  Requested Prescriptions  Pending Prescriptions Disp Refills   tiZANidine (ZANAFLEX) 4 MG tablet [Pharmacy Med Name: TIZANIDINE 4MG  TABLETS] 30 tablet 2    Sig: TAKE 1 TABLET(4 MG) BY MOUTH EVERY 8 HOURS AS NEEDED FOR MUSCLE SPASMS     Not Delegated - Cardiovascular:  Alpha-2 Agonists - tizanidine Failed - 10/22/2022  3:38 AM      Failed - This refill cannot be delegated      Passed - Valid encounter within last 6 months    Recent Outpatient Visits           2 months ago Primary osteoarthritis involving multiple joints   [facility] [place], [place], DO   5 months ago Annual physical exam   [facility] [place], [place], DO   8 months ago Moderate persistent asthma without complication   [facility] [place], [place], DO   1 year ago Moderate persistent asthma without complication   [facility], DO   2 years ago Essential hypertension   [facility], Althea Charon, DO       Future Appointments             In 1 week Netta Neat, Althea Charon, DO [facility], [facility]   In 4 months [facility], Althea Charon, DO [facility], [facility]               Requested Prescriptions  Pending Prescriptions Disp Refills   tiZANidine (ZANAFLEX) 4 MG tablet [Pharmacy Med Name: TIZANIDINE 4MG  TABLETS] 30 tablet 2    Sig: TAKE 1 TABLET(4 MG) BY MOUTH EVERY 8 HOURS AS NEEDED FOR MUSCLE SPASMS     Not Delegated - Cardiovascular:  Alpha-2 Agonists - tizanidine Failed - 10/22/2022  3:38 AM      Failed - This refill cannot be delegated      Passed - Valid encounter within last 6 months    Recent Outpatient  Visits           2 months ago Primary osteoarthritis involving multiple joints   [facility] [place], [facility], DO   5 months ago Annual physical exam   [facility] Net[facility] Neat, DO   8 months ago Moderate persistent asthma without complication   [facility] [place], [facility], DO   1 year ago Moderate persistent asthma without complication   [facility], [facility], DO   2 years ago Essential hypertension   [facility] [place], [facility], DO       Future Appointments             In 1 week [place], Netta Neat, DO [facility], [facility]   In 4 months Netta Neat, [facility], DO [facility], [facility]

## 2022-10-25 DIAGNOSIS — J45909 Unspecified asthma, uncomplicated: Secondary | ICD-10-CM | POA: Diagnosis not present

## 2022-10-31 ENCOUNTER — Ambulatory Visit: Payer: Medicare Other | Admitting: Family Medicine

## 2022-11-07 ENCOUNTER — Other Ambulatory Visit: Payer: Self-pay | Admitting: Family Medicine

## 2022-11-07 ENCOUNTER — Encounter: Payer: Self-pay | Admitting: Family Medicine

## 2022-11-07 ENCOUNTER — Ambulatory Visit (INDEPENDENT_AMBULATORY_CARE_PROVIDER_SITE_OTHER): Payer: 59 | Admitting: Family Medicine

## 2022-11-07 VITALS — BP 142/84 | HR 70 | Ht 61.0 in | Wt 117.0 lb

## 2022-11-07 DIAGNOSIS — I10 Essential (primary) hypertension: Secondary | ICD-10-CM

## 2022-11-07 DIAGNOSIS — E782 Mixed hyperlipidemia: Secondary | ICD-10-CM

## 2022-11-07 DIAGNOSIS — J3089 Other allergic rhinitis: Secondary | ICD-10-CM

## 2022-11-07 DIAGNOSIS — G2581 Restless legs syndrome: Secondary | ICD-10-CM

## 2022-11-07 DIAGNOSIS — M159 Polyosteoarthritis, unspecified: Secondary | ICD-10-CM

## 2022-11-07 DIAGNOSIS — J4540 Moderate persistent asthma, uncomplicated: Secondary | ICD-10-CM

## 2022-11-07 DIAGNOSIS — M545 Low back pain, unspecified: Secondary | ICD-10-CM

## 2022-11-07 DIAGNOSIS — F5101 Primary insomnia: Secondary | ICD-10-CM

## 2022-11-07 DIAGNOSIS — G894 Chronic pain syndrome: Secondary | ICD-10-CM

## 2022-11-07 DIAGNOSIS — Z0000 Encounter for general adult medical examination without abnormal findings: Secondary | ICD-10-CM

## 2022-11-07 DIAGNOSIS — G8929 Other chronic pain: Secondary | ICD-10-CM

## 2022-11-07 DIAGNOSIS — R7309 Other abnormal glucose: Secondary | ICD-10-CM

## 2022-11-07 MED ORDER — MONTELUKAST SODIUM 10 MG PO TABS: 10.0000 mg | ORAL_TABLET | Freq: Every day | ORAL | 3 refills | Status: DC

## 2022-11-07 MED ORDER — FEXOFENADINE HCL 180 MG PO TABS: 180.0000 mg | ORAL_TABLET | Freq: Every day | ORAL | 3 refills | Status: DC

## 2022-11-07 MED ORDER — ALBUTEROL SULFATE HFA 108 (90 BASE) MCG/ACT IN AERS: 2.0000 | INHALATION_SPRAY | Freq: Four times a day (QID) | RESPIRATORY_TRACT | 3 refills | Status: DC | PRN

## 2022-11-07 MED ORDER — SYMBICORT 160-4.5 MCG/ACT IN AERO: 2.0000 | INHALATION_SPRAY | Freq: Two times a day (BID) | RESPIRATORY_TRACT | 12 refills | Status: DC

## 2022-11-07 MED ORDER — VALSARTAN 160 MG PO TABS: 160.0000 mg | ORAL_TABLET | Freq: Every day | ORAL | 3 refills | Status: DC

## 2022-11-07 MED ORDER — TRAZODONE HCL 50 MG PO TABS: 50.0000 mg | ORAL_TABLET | Freq: Every evening | ORAL | 3 refills | Status: DC | PRN

## 2022-11-07 MED ORDER — ROSUVASTATIN CALCIUM 20 MG PO TABS: 20.0000 mg | ORAL_TABLET | Freq: Every day | ORAL | 3 refills | Status: DC

## 2022-11-07 MED ORDER — TIZANIDINE HCL 4 MG PO TABS: 4.0000 mg | ORAL_TABLET | Freq: Three times a day (TID) | ORAL | 3 refills | Status: DC | PRN

## 2022-11-07 NOTE — Assessment & Plan Note (Signed)
On Gabapentin for RLS

## 2022-11-07 NOTE — Assessment & Plan Note (Addendum)
Previously Controlled cholesterol on statin lifestyle  Plan: 1. Continue current meds - Rosuvastatin 20mg  2. Encourage improved lifestyle - low carb/cholesterol, reduce portion size, continue improving regular exercise  F/u lipids 04/2023
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
## 2022-11-07 NOTE — Assessment & Plan Note (Signed)
Mildly elevated initial BP, repeat manual check improved. - Home BP readings limited  No known complications  Failed ACEi cough, Amlodipine monotherapy in past uncertain side effect, and Losartan, HCTZ  Plan:  She prefers to avoid med change today Continue Valsartan 160mg  daily, reconsider future 320mg  dose 3. Encourage improved lifestyle - low sodium diet, regular exercise 4. Continue monitor BP outside office, bring readings to next visit, if persistently >140/90 or new symptoms notify office sooner

## 2022-11-07 NOTE — Patient Instructions (Addendum)
Thank you for coming to the office today.  All medicines refilled to [place]  Please re-schedule the Annual Physical to June or July 2024   DUE for FASTING BLOOD WORK (no food or drink after midnight before the lab appointment, only water or coffee without cream/sugar on the morning of)  SCHEDULE "Lab Only" visit in the morning at the clinic for lab draw in 6 MONTHS   - Make sure Lab Only appointment is at about 1 week before your next appointment, so that results will be available  For Lab Results, once available within 2-3 days of blood draw, you can can log in to MyChart online to view your results and a brief explanation. Also, we can discuss results at next follow-up visit.   Please schedule a Follow-up Appointment to: Return in about 6 months (around 05/08/2023) for 6 month fasting lab then 1 week later Annual Physical - please re-schedule her April visits to later.  If you have any other questions or concerns, please feel free to call the office or send a message through [place]. You may also schedule an earlier appointment if necessary.  Additionally, you may be receiving a survey about your experience at our office within a few days to 1 week by e-mail or mail. We value your feedback.  Nobie Putnam, DO [place]

## 2022-11-07 NOTE — Assessment & Plan Note (Signed)
Stable chronic problem Re order Gabapentin, Tizanidine

## 2022-11-19 ENCOUNTER — Emergency Department: Payer: 59

## 2022-11-19 ENCOUNTER — Other Ambulatory Visit: Payer: Self-pay

## 2022-11-19 ENCOUNTER — Emergency Department
Admission: EM | Admit: 2022-11-19 | Discharge: 2022-11-19 | Disposition: A | Discharge status: Home / Self Care | Payer: 59 | Attending: Emergency Medicine | Admitting: Emergency Medicine

## 2022-11-19 DIAGNOSIS — S9031XA Contusion of right foot, initial encounter: Secondary | ICD-10-CM | POA: Insufficient documentation

## 2022-11-19 DIAGNOSIS — S99921A Unspecified injury of right foot, initial encounter: Secondary | ICD-10-CM | POA: Diagnosis present

## 2022-11-19 DIAGNOSIS — M79671 Pain in right foot: Secondary | ICD-10-CM | POA: Diagnosis not present

## 2022-11-19 DIAGNOSIS — J45909 Unspecified asthma, uncomplicated: Secondary | ICD-10-CM | POA: Insufficient documentation

## 2022-11-19 DIAGNOSIS — Y92009 Unspecified place in unspecified non-institutional (private) residence as the place of occurrence of the external cause: Secondary | ICD-10-CM | POA: Insufficient documentation

## 2022-11-19 DIAGNOSIS — W208XXA Other cause of strike by thrown, projected or falling object, initial encounter: Secondary | ICD-10-CM | POA: Diagnosis not present

## 2022-11-19 DIAGNOSIS — I519 Heart disease, unspecified: Secondary | ICD-10-CM | POA: Insufficient documentation

## 2022-11-19 DIAGNOSIS — I10 Essential (primary) hypertension: Secondary | ICD-10-CM | POA: Diagnosis not present

## 2022-11-19 MED ORDER — DICLOFENAC SODIUM 1 % EX GEL: 2.0000 g | Freq: Four times a day (QID) | CUTANEOUS | 0 refills | Status: DC

## 2022-11-19 NOTE — ED Triage Notes (Signed)
Pt presents to the ED via POV due to R foot pain. Pt states a medal rod fell on her foot about a month ago. Pt A&Ox4

## 2022-11-19 NOTE — ED Provider Notes (Signed)
[facility] Provider Note    Event Date/Time   First MD Initiated Contact with Patient 11/19/22 [id number]     (approximate)   History   Foot Pain   HPI  Kirsten [name] is a 77 y.o. female with history of heart disease, hypertension, asthma presents emergency department with right foot pain.  Patient states that she went under the house to check on something when a pipe fell on her right foot.  This happened about 1 month ago.  Continues to have pain in the foot.  States it feels swollen on the bottom.  Is not painful to touch but is painful to bear weight.  Denies numbness or tingling      Physical Exam   Triage Vital Signs: ED Triage Vitals  Enc Vitals Group     BP 11/19/22 0900 (!) 187/84     Pulse Rate 11/19/22 0900 (!) 58     Resp 11/19/22 0900 16     Temp 11/19/22 0900 97.8 F (36.6 C)     Temp Source 11/19/22 0900 Oral     SpO2 11/19/22 0900 97 %     Weight 11/19/22 0857 116 lb 13.5 oz (53 kg)     Height 11/19/22 0857 5\' 1"  (1.549 m)     Head Circumference --      Peak Flow --      Pain Score 11/19/22 0856 6     Pain Loc --      Pain Edu? --      Excl. in [place]? --     Most recent vital signs: Vitals:   11/19/22 0900  BP: (!) 187/84  Pulse: (!) 58  Resp: 16  Temp: 97.8 F (36.6 C)  SpO2: 97%     General: Awake, no distress.   CV:  Good peripheral perfusion. regular rate and  rhythm Resp:  Normal effort.  Abd:  No distention.   Other:  Right foot tender on plantar surface, patient has full range of motion, neurovascular is intact   ED Results / Procedures / Treatments   Labs (all labs ordered are listed, but only abnormal results are displayed) Labs Reviewed - No data to display   EKG     RADIOLOGY X-ray of the right foot    PROCEDURES:   Procedures   MEDICATIONS ORDERED IN ED: Medications - No data to display   IMPRESSION / MDM / [place] / ED COURSE  I reviewed the triage vital signs and the  nursing notes.                              Differential diagnosis includes, but is not limited to, fracture, contusion, sprain  Patient's presentation is most consistent with acute complicated illness / injury requiring diagnostic workup.   X-ray of the right foot independently reviewed and interpreted by me as being negative for any acute abnormality  I did Splane these findings to the patient.  She was given a postop shoe to help calm the foot down.  She is to use Voltaren cream/ointment.  Do not feel that any oral NSAID would be appropriate at her age along with her medical problems.  She is to follow-up with her regular doctor if not improved in 1 week.  She will follow-up with podiatry if she would rather see them.  Discharged in stable condition and is in agreement treatment plan.  FINAL CLINICAL IMPRESSION(S) / ED DIAGNOSES   Final diagnoses:  Contusion of right foot, initial encounter     Rx / DC Orders   ED Discharge Orders          Ordered    diclofenac Sodium (VOLTAREN ARTHRITIS PAIN) 1 % GEL  4 times daily        11/19/22 1006             Note:  This document was prepared using Dragon voice recognition software and may include unintentional dictation errors.    Versie Starks, PA-C 11/19/22 1014    Nance Pear, MD 11/19/22 [phone]

## 2022-12-03 ENCOUNTER — Ambulatory Visit (INDEPENDENT_AMBULATORY_CARE_PROVIDER_SITE_OTHER): Payer: 59 | Admitting: Podiatry

## 2022-12-03 ENCOUNTER — Encounter: Payer: Self-pay | Admitting: Podiatry

## 2022-12-03 DIAGNOSIS — G629 Polyneuropathy, unspecified: Secondary | ICD-10-CM | POA: Diagnosis not present

## 2022-12-03 DIAGNOSIS — S9031XA Contusion of right foot, initial encounter: Secondary | ICD-10-CM | POA: Diagnosis not present

## 2022-12-03 NOTE — Progress Notes (Signed)
  Subjective:  Patient ID: Kirsten [name], female    DOB: 17-Jan-1946,  MRN: [id number]  Chief Complaint  Patient presents with   Foot Injury    np injured right foot when piece of metal fell across toe area/ throbbing pain/ went to ed    77 y.o. female presents with the above complaint. History confirmed with patient.  This happened about 6 weeks ago, she had gone on to the house to feed her cats and a piece of metal pipe fell on the foot.  Still feels sore and achy  Objective:  Physical Exam: warm, good capillary refill, no trophic changes or ulcerative lesions, normal DP and PT pulses, and normal sensory exam. Left Foot: normal exam, no swelling, tenderness, instability; ligaments intact, full range of motion of all ankle/foot joints Right Foot: normal exam, no swelling, tenderness, instability; ligaments intact, full range of motion of all ankle/foot joints     Radiographs: Multiple views x-ray of the right foot: no fracture, dislocation, swelling or degenerative changes noted Assessment:   1. Contusion of right foot, initial encounter      Plan:  Patient was evaluated and treated and all questions answered.  I discussed with her she does have any evidence of fracture or dislocation she has good smooth range of motion of the joints there is no pain to palpation.  Suspect she likely is feeling compression neuritis from nerve contusion.  This may take a few more months to fully resolve.  Advised her to use the Voltaren gel and continue taking gabapentin as needed.  Return to see me as needed if it does not improve or worsens  Return if symptoms worsen or fail to improve.

## 2022-12-05 DIAGNOSIS — J45909 Unspecified asthma, uncomplicated: Secondary | ICD-10-CM | POA: Diagnosis not present

## 2022-12-28 DIAGNOSIS — J45909 Unspecified asthma, uncomplicated: Secondary | ICD-10-CM | POA: Diagnosis not present

## 2023-02-20 ENCOUNTER — Other Ambulatory Visit: Payer: Medicare Other

## 2023-02-27 ENCOUNTER — Other Ambulatory Visit: Payer: Self-pay | Admitting: Family Medicine

## 2023-02-27 ENCOUNTER — Encounter: Payer: Medicare Other | Admitting: Family Medicine

## 2023-02-27 DIAGNOSIS — G8929 Other chronic pain: Secondary | ICD-10-CM

## 2023-02-27 DIAGNOSIS — I10 Essential (primary) hypertension: Secondary | ICD-10-CM

## 2023-02-27 DIAGNOSIS — G894 Chronic pain syndrome: Secondary | ICD-10-CM

## 2023-02-27 DIAGNOSIS — G2581 Restless legs syndrome: Secondary | ICD-10-CM

## 2023-02-27 NOTE — Telephone Encounter (Signed)
Requested Prescriptions  Pending Prescriptions Disp Refills   gabapentin (NEURONTIN) 300 MG capsule [Pharmacy Med Name: GABAPENTIN  CAPSULES] 90 capsule 0    Sig: TAKE 1 CAPSULE(300 MG) BY MOUTH AT BEDTIME     Neurology: Anticonvulsants - gabapentin Passed - 02/27/2023  6:27 AM      Passed - Cr in normal range and within 360 days    Creat  Date Value Ref Range Status  05/01/2022 0.97 0.60 - 1.00 mg/dL Final         Passed - Completed PHQ-2 or PHQ-9 in the last 360 days      Passed - Valid encounter within last 12 months    Recent Outpatient Visits           3 months ago Primary hypertension   [place] [facility] [place], Netta Neat, DO   6 months ago Primary osteoarthritis involving multiple joints   [place] [facility] Smitty Cords, DO   10 months ago Annual physical exam   [place] [facility] Smitty Cords, DO   1 year ago Moderate persistent asthma without complication   [place] [facility] [place], Netta Neat, DO   1 year ago Moderate persistent asthma without complication   [place] [facility] [place], Netta Neat, DO       Future Appointments             In 2 months Althea Charon, Netta Neat, DO [facility]             valsartan (DIOVAN) 80 MG tablet [Pharmacy Med Name: VALSARTAN  TABLETS] 90 tablet 3    Sig: TAKE 1 TABLET(80 MG) BY MOUTH DAILY     Cardiovascular:  Angiotensin Receptor Blockers Failed - 02/27/2023  6:27 AM      Failed - Cr in normal range and within 180 days    Creat  Date Value Ref Range Status  05/01/2022 0.97 0.60 - 1.00 mg/dL Final         Failed - K in normal range and within 180 days    Potassium  Date Value Ref Range Status  05/01/2022 4.6 3.5 - 5.3 mmol/L Final  09/13/2014 3.8 3.5 - 5.1 mmol/L Final         Failed - Last BP in normal range    BP  Readings from Last 1 Encounters:  11/19/22 (!) 187/84         Passed - Patient is not pregnant      Passed - Valid encounter within last 6 months    Recent Outpatient Visits           3 months ago Primary hypertension   [place] [facility] Smitty Cords, DO   6 months ago Primary osteoarthritis involving multiple joints   [place] [facility] Smitty Cords, DO   10 months ago Annual physical exam   [place] [facility] Smitty Cords, DO   1 year ago Moderate persistent asthma without complication   [place] [facility] [place], Netta Neat, DO   1 year ago Moderate persistent asthma without complication   [place] [facility] [place], Netta Neat, DO       Future Appointments             In 2 months Althea Charon, Netta Neat, DO   [facility], [facility]

## 2023-03-01 ENCOUNTER — Ambulatory Visit: Payer: 59 | Admitting: Pharmacist

## 2023-03-01 ENCOUNTER — Encounter: Payer: Self-pay | Admitting: Pharmacist

## 2023-03-01 DIAGNOSIS — I10 Essential (primary) hypertension: Secondary | ICD-10-CM

## 2023-03-01 NOTE — Telephone Encounter (Signed)
This encounter was created in error - please disregard.

## 2023-03-01 NOTE — Progress Notes (Signed)
Outreach Note  03/01/2023 Name: Kirsten [name] MRN: [id number] DOB: Sep 10, 1946  I connected with Kirsten [name] on 03/01/23 by telephone outreach and verified that I am speaking with the correct person using two identifiers.  Patient appearing on report for [place] Metric Hypertension Control due to last documented ambulatory blood pressure of 187/84 on 11/19/2022. Next appointment with PCP is 05/03/2023.    Received message from [facility] CPh[facility] Noreene Larsson [place] advising that she outreached to patient on 02/22/2023 related to medication adherence to valsartan.  Outreached patient to discuss hypertension control and medication management.   Outpatient Encounter Medications as of 03/01/2023  Medication Sig   albuterol (PROVENTIL HFA) 108 (90 Base) MCG/ACT inhaler Inhale 2 puffs into the lungs every 6 (six) hours as needed for wheezing or shortness of breath.   AREXVY 120 MCG/0.5ML injection    aspirin EC 81 MG tablet Take 1 tablet (81 mg total) by mouth daily.   diclofenac Sodium (VOLTAREN ARTHRITIS PAIN) 1 % GEL Apply 2 g topically 4 (four) times daily. To right foot for 1 week (Patient not taking: Reported on 03/01/2023)   fexofenadine (ALLEGRA) 180 MG tablet Take 1 tablet (180 mg total) by mouth daily. (Patient taking differently: Take 180 mg by mouth daily as needed.)   fluticasone (FLONASE) 50 MCG/ACT nasal spray Place 2 sprays into both nostrils daily. Use for 4-6 weeks then stop and use seasonally or as needed.   gabapentin (NEURONTIN) 300 MG capsule TAKE 1 CAPSULE(300 MG) BY MOUTH AT BEDTIME   montelukast (SINGULAIR) 10 MG tablet Take 1 tablet (10 mg total) by mouth at bedtime.   Respir Rate Dev-Bld Press Mon (RESPERATE 1.0) KIT Use daily to help lower blood pressure   rosuvastatin (CRESTOR) 20 MG tablet Take 1 tablet (20 mg total) by mouth at bedtime.   SPIKEVAX syringe    SYMBICORT 160-4.5 MCG/ACT inhaler Inhale 2 puffs into the lungs in the morning and at bedtime.   tiZANidine (ZANAFLEX) 4  MG tablet Take 1 tablet (4 mg total) by mouth every 8 (eight) hours as needed for muscle spasms.   traZODone (DESYREL) 50 MG tablet Take 1 tablet (50 mg total) by mouth at bedtime as needed for sleep. (Patient not taking: Reported on 03/01/2023)   valsartan (DIOVAN) 160 MG tablet Take 1 tablet (160 mg total) by mouth daily.   No facility-administered encounter medications on file as of 03/01/2023.    Lab Results  Component Value Date   CREATININE 0.97 05/01/2022   BUN 17 05/01/2022   NA 143 05/01/2022   K 4.6 05/01/2022   CL 109 05/01/2022   CO2 26 05/01/2022    BP Readings from Last 3 Encounters:  11/19/22 (!) 187/84  11/07/22 (!) 142/84  08/21/22 138/82    Pulse Readings from Last 3 Encounters:  11/19/22 (!) 58  11/07/22 70  08/21/22 64    Current medications: valsartan 160 mg daily - However, today find patient only has valsartan 80 mg dose at home and not taking consistently as did not feel like it is working.  Per review of dispensing history, patient last filled prescription for valsartan 80 mg on 11/22/2022 for 90 day supply. However, from review of chart, note patient currently to take valsartan 160 mg daily. [facility]. Today find patient has switched back to [facility] and so current pharmacy does not have patient's latest prescription.  Outreach to [employer] today. Request pharmacy transfer  valsartan [facility], fill prescription and have delivered to patient   Home Monitoring: Patient does not have an automated upper arm home BP machine - reports previous monitor is broken Encourage patient to use health plan over the counter benefit (OTC) to obtain a new upper arm blood pressure monitor Counsel on BP monitoring technique  Admits to adding salt to her food   Admits to drinking coca cola throughout the day  Current Physical Activity: limited to movement around  home   Assessment/Plan: - Currently uncontrolled - Reviewed appropriate administration of medication regimen - Counseled on long term microvascular and macrovascular complications of uncontrolled hypertension - Reviewed appropriate home BP monitoring technique (avoid caffeine, smoking, and exercise for 30 minutes before checking, rest for at least 5 minutes before taking BP, sit with feet flat on the floor and back against a hard surface, uncross legs, and rest arm on flat surface) - Reviewed to obtain new upper arm BP monitor, check blood pressure, document, and provide at next provider visit - Discussed dietary modifications, such as:  Reducing salt intake Reviewing nutrition labels for sodium content of foods Reducing consumption of soda. Drinking water in place of soda - Reviewed strategies to improve medication adherence. Encourage patient to obtain and start using weekly pillbox to aid with adherence  Follow Up Plan: CM Pharmacist will outreach to patient by telephone again on 03/22/2023 at 9:15 AM   Estelle Grumbles, PharmD, [facility] Clinical Pharmacist [facility] [phone]

## 2023-03-01 NOTE — Patient Instructions (Signed)
Check your blood pressure once daily, and any time you have concerning symptoms like headache, chest pain, dizziness, shortness of breath, or vision changes.   Our goal is less than 130/80.  To appropriately check your blood pressure, make sure you do the following:  1) Avoid caffeine, exercise, or tobacco products for 30 minutes before checking. Empty your bladder. 2) Sit with your back supported in a flat-backed chair. Rest your arm on something flat (arm of the chair, table, etc). 3) Sit still with your feet flat on the floor, resting, for at least 5 minutes.  4) Check your blood pressure. Take 1-2 readings.  5) Write down these readings and bring with you to any provider appointments.  Bring your home blood pressure machine with you to a provider's office for accuracy comparison at least once a year.   Make sure you take your blood pressure medications before you come to any office visit, even if you were asked to fast for labs.  Kirsten [name], PharmD, BCACP Clinical Pharmacist [facility] [place] [phone]  

## 2023-03-07 ENCOUNTER — Other Ambulatory Visit: Payer: Self-pay | Admitting: Family Medicine

## 2023-03-07 DIAGNOSIS — E782 Mixed hyperlipidemia: Secondary | ICD-10-CM

## 2023-03-07 NOTE — Telephone Encounter (Signed)
Rx 11/07/22 #90 3RF- too soon Requested Prescriptions  Pending Prescriptions Disp Refills   rosuvastatin (CRESTOR) 20 MG tablet [Pharmacy Med Name: ROSUVASTATIN 20MG  TABLETS] 90 tablet 3    Sig: TAKE 1 TABLET(20 MG) BY MOUTH AT BEDTIME     Cardiovascular:  Antilipid - Statins 2 Failed - 03/07/2023  6:26 AM      Failed - Lipid Panel in normal range within the last 12 months    Cholesterol, Total  Date Value Ref Range Status  11/09/2015 205 (H) 100 - 199 mg/dL Final   Cholesterol  Date Value Ref Range Status  05/01/2022 211 (H) <200 mg/dL Final  16/08/9603 540 (H) 0 - 200 mg/dL Final   Ldl Cholesterol, Calc  Date Value Ref Range Status  07/18/2014 133 (H) 0 - 100 mg/dL Final   LDL Cholesterol (Calc)  Date Value Ref Range Status  05/01/2022 141 (H) mg/dL (calc) Final    Comment:    Reference range: <100 . Desirable range <100 mg/dL for primary prevention;   <70 mg/dL for patients with CHD or diabetic patients  with > or = 2 CHD risk factors. Marland Kitchen LDL-C is now calculated using the Martin-Hopkins  calculation, which is a validated novel method providing  better accuracy than the Friedewald equation in the  estimation of LDL-C.  Horald Pollen et al. Lenox Ahr. 9811;914(78): 2061-2068  (http://education.[employer].com/faq/FAQ164)    HDL Cholesterol  Date Value Ref Range Status  07/18/2014 57 40 - 60 mg/dL Final   HDL  Date Value Ref Range Status  05/01/2022 53 > OR = 50 mg/dL Final  29/56/2130 50 >86 mg/dL Final   Triglycerides  Date Value Ref Range Status  05/01/2022 73 <150 mg/dL Final  57/84/6962 78 0 - 200 mg/dL Final         Passed - Cr in normal range and within 360 days    Creat  Date Value Ref Range Status  05/01/2022 0.97 0.60 - 1.00 mg/dL Final         Passed - Patient is not pregnant      Passed - Valid encounter within last 12 months    Recent Outpatient Visits           6 days ago Primary hypertension   [place] [facility] [place],  Jackelyn Poling, RPH-CPP   4 months ago Primary hypertension   [place] [facility] [place], Netta Neat, DO   6 months ago Primary osteoarthritis involving multiple joints   [place] [facility] Smitty Cords, DO   10 months ago Annual physical exam   [place] [facility] Smitty Cords, DO   1 year ago Moderate persistent asthma without complication   [place] [facility] [place], Netta Neat, DO       Future Appointments             In 1 month Althea Charon, Netta Neat, DO [place] [facility], [facility]

## 2023-03-20 DIAGNOSIS — J45909 Unspecified asthma, uncomplicated: Secondary | ICD-10-CM | POA: Diagnosis not present

## 2023-03-22 ENCOUNTER — Other Ambulatory Visit: Payer: Self-pay | Admitting: Family Medicine

## 2023-03-22 ENCOUNTER — Ambulatory Visit: Payer: 59 | Admitting: Pharmacist

## 2023-03-22 DIAGNOSIS — E782 Mixed hyperlipidemia: Secondary | ICD-10-CM

## 2023-03-22 DIAGNOSIS — I10 Essential (primary) hypertension: Secondary | ICD-10-CM

## 2023-03-22 DIAGNOSIS — M159 Polyosteoarthritis, unspecified: Secondary | ICD-10-CM

## 2023-03-22 NOTE — Progress Notes (Signed)
Outreach Note  03/22/2023 Name: Kirsten [name] MRN: [id number] DOB: Sep 11, 1946  I connected with Kirsten [name] on 03/22/23 by telephone outreach and verified that I am speaking with the correct person using two identifiers.  Patient appearing on report for [place] Metric Hypertension Control due to last documented ambulatory blood pressure of 187/84 on 11/19/2022. Next appointment with PCP is 05/03/2023.   Outreached patient to discuss hypertension control and medication management.   Outpatient Encounter Medications as of 03/22/2023  Medication Sig   albuterol (PROVENTIL HFA) 108 (90 Base) MCG/ACT inhaler Inhale 2 puffs into the lungs every 6 (six) hours as needed for wheezing or shortness of breath.   AREXVY 120 MCG/0.5ML injection    aspirin EC 81 MG tablet Take 1 tablet (81 mg total) by mouth daily.   diclofenac Sodium (VOLTAREN ARTHRITIS PAIN) 1 % GEL Apply 2 g topically 4 (four) times daily. To right foot for 1 week (Patient not taking: Reported on 03/01/2023)   fexofenadine (ALLEGRA) 180 MG tablet Take 1 tablet (180 mg total) by mouth daily. (Patient taking differently: Take 180 mg by mouth daily as needed.)   fluticasone (FLONASE) 50 MCG/ACT nasal spray Place 2 sprays into both nostrils daily. Use for 4-6 weeks then stop and use seasonally or as needed.   gabapentin (NEURONTIN) 300 MG capsule TAKE 1 CAPSULE(300 MG) BY MOUTH AT BEDTIME   montelukast (SINGULAIR) 10 MG tablet Take 1 tablet (10 mg total) by mouth at bedtime.   Respir Rate Dev-Bld Press Mon (RESPERATE 1.0) KIT Use daily to help lower blood pressure   rosuvastatin (CRESTOR) 20 MG tablet Take 1 tablet (20 mg total) by mouth at bedtime.   SPIKEVAX syringe    SYMBICORT 160-4.5 MCG/ACT inhaler Inhale 2 puffs into the lungs in the morning and at bedtime.   tiZANidine (ZANAFLEX) 4 MG tablet Take 1 tablet (4 mg total) by mouth every 8 (eight) hours as needed for muscle spasms.   traZODone (DESYREL) 50 MG tablet Take 1  tablet (50 mg total) by mouth at bedtime as needed for sleep. (Patient not taking: Reported on 03/01/2023)   valsartan (DIOVAN) 160 MG tablet Take 1 tablet (160 mg total) by mouth daily. (Patient not taking: Reported on 03/22/2023)   No facility-administered encounter medications on file as of 03/22/2023.    Lab Results  Component Value Date   CREATININE 0.97 05/01/2022   BUN 17 05/01/2022   NA 143 05/01/2022   K 4.6 05/01/2022   CL 109 05/01/2022   CO2 26 05/01/2022    BP Readings from Last 3 Encounters:  11/19/22 (!) 187/84  11/07/22 (!) 142/84  08/21/22 138/82    Pulse Readings from Last 3 Encounters:  11/19/22 (!) 58  11/07/22 70  08/21/22 64    Current medications: none  Reports has self-discontinued her valsartan as feels like she has throbbing in her head when she takes it, but not since stopped; feels like it raises her blood pressure.    Home Monitoring: Patient does not have an automated upper arm home BP machine - reports previous monitor is broken Again encourage patient to use health plan over the counter benefit (OTC) to obtain a new upper arm blood pressure monitor Have counseled on BP monitoring technique  Reports home BP recently checked during a home visit from health plan nurse and was told that her BP was "fine", but unable to recall specific result   Reports has significantly reduced her salt/sodium intake  Reports has reduced her caffeine intake (previously reported drinking only coca cola throughout the day). Reports now drinking water and a peach drink instead   Current Physical Activity: limited to movement around home     Assessment/Plan: - Currently uncontrolled - Reviewed appropriate administration of medication regimen - Have counseled on long term microvascular and macrovascular complications of uncontrolled hypertension - Reviewed appropriate home BP monitoring technique (avoid caffeine, smoking, and exercise for 30 minutes before  checking, rest for at least 5 minutes before taking BP, sit with feet flat on the floor and back against a hard surface, uncross legs, and rest arm on flat surface) - Encourage patient to contact office to schedule follow up appointment with PCP related to her BP  Will collaborate with PCP - Reviewed to obtain new upper arm BP monitor, check blood pressure, document, and provide at next provider visit - Encourage patient to continue positive dietary changes that she has made, including Reducing salt intake Reviewing nutrition labels for sodium content of foods Reducing consumption of soda. Drinking water in place of soda - Have reviewed strategies to improve medication adherence. Encouraged patient to obtain and start using weekly pillbox to aid with adherence  Patient reporting difficulty with keeping up with her appointments and health due to lack of social support and needing help with transportation - Will collaborate with PCP regarding referral to CCM Nursing and Social Work   Follow Up Plan: CM Pharmacist will outreach to patient by telephone again within the next 30 days   Estelle Grumbles, PharmD, [employer] Clinical Pharmacist [facility] [phone]

## 2023-03-22 NOTE — Patient Instructions (Signed)
Check your blood pressure once daily, and any time you have concerning symptoms like headache, chest pain, dizziness, shortness of breath, or vision changes.   Our goal is less than 130/80.  To appropriately check your blood pressure, make sure you do the following:  1) Avoid caffeine, exercise, or tobacco products for 30 minutes before checking. Empty your bladder. 2) Sit with your back supported in a flat-backed chair. Rest your arm on something flat (arm of the chair, table, etc). 3) Sit still with your feet flat on the floor, resting, for at least 5 minutes.  4) Check your blood pressure. Take 1-2 readings.  5) Write down these readings and bring with you to any provider appointments.  Bring your home blood pressure machine with you to a provider's office for accuracy comparison at least once a year.   Make sure you take your blood pressure medications before you come to any office visit, even if you were asked to fast for labs.  Abimbola Aki Gilford Lardizabal, PharmD, BCACP Clinical Pharmacist [facility] [place] [phone]  

## 2023-03-25 ENCOUNTER — Ambulatory Visit: Payer: 59 | Admitting: Family Medicine

## 2023-03-25 ENCOUNTER — Encounter: Payer: Self-pay | Admitting: Family Medicine

## 2023-03-26 NOTE — Progress Notes (Signed)
Patient arrived early for apt but she reported that she needed to reschedule.  She was not seen and treated on the date of scheduled apt 03/25/23  Kirsten Pilar, DO [facility] 03/26/2023, 12:59 PM

## 2023-03-26 NOTE — Patient Instructions (Signed)
° °  Please schedule a Follow-up Appointment to: No follow-ups on file. ° °If you have any other questions or concerns, please feel free to call the office or send a message through MyChart. You may also schedule an earlier appointment if necessary. ° °Additionally, you may be receiving a survey about your experience at our office within a few days to 1 week by e-mail or mail. We value your feedback. ° °Tayton Decaire, DO °[facility], [facility] °

## 2023-04-02 ENCOUNTER — Ambulatory Visit (INDEPENDENT_AMBULATORY_CARE_PROVIDER_SITE_OTHER): Payer: 59 | Admitting: Family Medicine

## 2023-04-02 ENCOUNTER — Telehealth: Payer: Self-pay | Admitting: Family Medicine

## 2023-04-02 ENCOUNTER — Encounter: Payer: Self-pay | Admitting: Family Medicine

## 2023-04-02 VITALS — BP 154/90 | HR 57 | Ht 61.0 in | Wt 117.0 lb

## 2023-04-02 DIAGNOSIS — M545 Low back pain, unspecified: Secondary | ICD-10-CM | POA: Diagnosis not present

## 2023-04-02 DIAGNOSIS — I10 Essential (primary) hypertension: Secondary | ICD-10-CM | POA: Diagnosis not present

## 2023-04-02 DIAGNOSIS — G2581 Restless legs syndrome: Secondary | ICD-10-CM

## 2023-04-02 DIAGNOSIS — G894 Chronic pain syndrome: Secondary | ICD-10-CM

## 2023-04-02 MED ORDER — NEBIVOLOL HCL 5 MG PO TABS
5.0000 mg | ORAL_TABLET | Freq: Every day | ORAL | 2 refills | Status: DC
Start: 2023-04-02 — End: 2023-04-17

## 2023-04-02 MED ORDER — GABAPENTIN 300 MG PO CAPS
300.0000 mg | ORAL_CAPSULE | Freq: Every day | ORAL | 3 refills | Status: DC
Start: 2023-04-02 — End: 2024-08-10

## 2023-04-02 NOTE — Telephone Encounter (Signed)
[facility] is calling regarding documentation and OV notes. Requesting if the OV notes can be resent? CB Daniel [phone] Fax- [phone]
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
## 2023-04-02 NOTE — Assessment & Plan Note (Signed)
Still Elevated initial BP, repeat manual check improved. OFF of medication Valsartan side effect HA - Home BP readings limited  No known complications  Failed ACEi cough, Amlodipine monotherapy in past uncertain side effect, and Losartan, HCTZ, Valsartan HA  Plan:  NEW rx beta blocker - Nebivolol 5mg  daily, discuss new class of med and trial, goal to avoid side effects, can dose increase if needed next up to 10-20mg  in future Encourage improved lifestyle - low sodium diet, regular exercise 3. Continue monitor BP outside office, bring readings to next visit, if persistently >140/90 or new symptoms notify office sooner

## 2023-04-02 NOTE — Assessment & Plan Note (Signed)
Improved on med On Gabapentin for RLS Reorder Gabapentin 300mg  AT NIGHT today

## 2023-04-02 NOTE — Patient Instructions (Addendum)
Thank you for coming to the office today.  Trial on new BP medication Bystolic Nebivolol 5mg  daily.  Remain off Valsartan due to side effect  Please schedule a Follow-up Appointment to: Return if symptoms worsen or fail to improve.  If you have any other questions or concerns, please feel free to call the office or send a message through MyChart. You may also schedule an earlier appointment if necessary.  Additionally, you may be receiving a survey about your experience at our office within a few days to 1 week by e-mail or mail. We value your feedback.  Saralyn Pilar, DO [facility], [place]

## 2023-04-02 NOTE — Assessment & Plan Note (Signed)
Stable, chronic low back pain On Gabapentin, re order today

## 2023-04-03 NOTE — Telephone Encounter (Signed)
Pt called in upset about getting calls and asking for her social about getting knee braces, she says she doesn't need.

## 2023-04-03 NOTE — Telephone Encounter (Signed)
Office notes faxed [phone]

## 2023-04-03 NOTE — Telephone Encounter (Signed)
Spoke with patient regarding concern.  She is upset about her bill.  She says that some how her insurance got switched and billed incorrectly.  She says she has called and tried to straighten it out.

## 2023-04-08 ENCOUNTER — Ambulatory Visit: Payer: Self-pay

## 2023-04-08 ENCOUNTER — Encounter: Payer: 59 | Admitting: *Deleted
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
## 2023-04-09 ENCOUNTER — Ambulatory Visit: Payer: Self-pay

## 2023-04-09 NOTE — Patient Outreach (Signed)
  Care Coordination   Follow Up Visit Note   04/09/2023 Name: Kirsten [name] MRN: [id number] DOB: [birth year]  Kirsten [name] is a 77 y.o. year old female who sees Smitty Cords, DO for primary care. I spoke with  Kirsten [name] by phone today.  What matters to the patients health and wellness today?  Patient contacted Medicaid Transportation and left a message to enroll for services.  Patient did not receive a return call and has called several times since yesterday.  SW encouraged patient to wait for a return call or she can call the main number to speak to switchboard to connect with staff.    Goals Addressed   None     SDOH assessments and interventions completed:  No     Care Coordination Interventions:  Yes, provided  Interventions Today    Flowsheet Row Most Recent Value  General Interventions   General Interventions Discussed/Reviewed General Interventions Reviewed  [SW provided contact number to Social Services switchboard to contact Medicaid Transportation]       Follow up plan: No further intervention required.   Encounter Outcome:  Pt. Visit Completed

## 2023-04-12 ENCOUNTER — Telehealth: Payer: 59

## 2023-04-15 ENCOUNTER — Telehealth: Payer: Self-pay

## 2023-04-15 ENCOUNTER — Ambulatory Visit (INDEPENDENT_AMBULATORY_CARE_PROVIDER_SITE_OTHER): Payer: 59

## 2023-04-15 ENCOUNTER — Telehealth: Payer: 59

## 2023-04-15 DIAGNOSIS — E782 Mixed hyperlipidemia: Secondary | ICD-10-CM

## 2023-04-15 DIAGNOSIS — G894 Chronic pain syndrome: Secondary | ICD-10-CM

## 2023-04-15 DIAGNOSIS — I10 Essential (primary) hypertension: Secondary | ICD-10-CM

## 2023-04-15 NOTE — Telephone Encounter (Signed)
   CCM RN Visit Note   04-15-2023 Name: [name] MRN: [id number]      DOB: April 05, 1946  Subjective: Kirsten [name] is a 77 y.o. year old female who is a primary care patient of Dr. Althea Charon. The patient was referred to the Chronic Care Management team for assistance with care management needs subsequent to provider initiation of CCM services and plan of care.      An unsuccessful telephone outreach was attempted today to contact the patient about Chronic Care Management needs.    Plan:The care management team will reach out to the patient again over the next 30 days.  Alto Denver RN, MSN, CCM RN Care Manager  Chronic Care Management Direct Number: [phone]

## 2023-04-15 NOTE — Plan of Care (Signed)
Chronic Care Management Provider Comprehensive Care Plan    04/15/2023 Name: Kirsten [name] MRN: [id number] DOB: 1946-04-12  Referral to Chronic Care Management (CCM) services was placed by Provider:  DR. Althea Charon on Date: 03-22-2023.  Chronic Condition 1: HTN Provider Assessment and Plan  Still Elevated initial BP, repeat manual check improved. OFF of medication Valsartan side effect HA - Home BP readings limited  No known complications  Failed ACEi cough, Amlodipine monotherapy in past uncertain side effect, and Losartan, HCTZ, Valsartan HA   Plan:  NEW rx beta blocker - Nebivolol 5mg  daily, discuss new class of med and trial, goal to avoid side effects, can dose increase if needed next up to 10-20mg  in future Encourage improved lifestyle - low sodium diet, regular exercise 3. Continue monitor BP outside office, bring readings to next visit, if persistently >140/90 or new symptoms notify office sooner         Relevant Medications    nebivolol (BYSTOLIC) 5 MG tablet     Expected Outcome/Goals Addressed This Visit (Provider CCM goals/Provider Assessment and plan   CCM (HYPERTENSION)  EXPECTED OUTCOME:  MONITOR,SELF- MANAGE AND REDUCE SYMPTOMS OF HYPERTENSION   Symptom Management Condition 1: Take all medications as prescribed Attend all scheduled provider appointments Call provider office for new concerns or questions  call the Suicide and Crisis Lifeline: 988 call the [place] National Suicide Prevention Lifeline: 4340217913 or TTY: (709)118-4594 TTY (314)006-2564) to talk to a trained counselor call 1-800-273-TALK (toll free, 24 hour hotline) if experiencing a Mental Health or Behavioral Health Crisis  check blood pressure daily write blood pressure results in a log or diary learn about high blood pressure keep a blood pressure log take blood pressure log to all doctor appointments call doctor for signs and symptoms of high blood pressure keep all doctor  appointments take medications for blood pressure exactly as prescribed report new symptoms to your doctor  Chronic Condition 2: HLD Provider Assessment and Plan Encourage improved lifestyle - low sodium diet, regular exercise Previously Controlled cholesterol on statin lifestyle   Plan: 1. Continue current meds - Rosuvastatin 20mg  2. Encourage improved lifestyle - low carb/cholesterol, reduce portion size, continue improving regular exercise     Expected Outcome/Goals Addressed This Visit (Provider CCM goals/Provider Assessment and plan    CCM (HLD)  EXPECTED OUTCOME:  MONITOR, SELF- MANAGE AND REDUCE SYMPTOMS OF HLD    Symptom Management Condition 2: Take all medications as prescribed Attend all scheduled provider appointments Call provider office for new concerns or questions  call the Suicide and Crisis Lifeline: 988 call the [place] National Suicide Prevention Lifeline: 661-390-8212 or TTY: (217)371-4561 TTY 260-081-5143) to talk to a trained counselor call 1-800-273-TALK (toll free, 24 hour hotline) if experiencing a Mental Health or Behavioral Health Crisis  take all medications exactly as prescribed call doctor with any symptoms you believe are related to your medicine call doctor when you experience any new symptoms go to all doctor appointments as scheduled adhere to prescribed diet: Heart Healthy Diet   Chronic Condition 3: Arthritis Provider Assessment and Plan  Stable, chronic low back pain On Gabapentin, re order today         Relevant Medications    gabapentin (NEURONTIN) 300 MG capsule    Chronic pain    Relevant Medications    gabapentin (NEURONTIN) 300 MG capsule     Expected Outcome/Goals Addressed This Visit (Provider CCM goals/Provider Assessment and plan    CCM (Arthritis)  EXPECTED OUTCOME:  MONITOR, SELF-  MANAGE AND REDUCE SYMPTOMS OF Arthritis     Symptom Management Condition 3: Take all medications as prescribed Attend all scheduled  provider appointments Call provider office for new concerns or questions  call the Suicide and Crisis Lifeline: 988 call the [place] National Suicide Prevention Lifeline: (815)366-4826 or TTY: 773-511-8685 TTY 843-314-6750) to talk to a trained counselor call 1-800-273-TALK (toll free, 24 hour hotline) if experiencing a Mental Health or Behavioral Health Crisis  Problem List Patient Active Problem List   Diagnosis Date Noted   Osteoarthritis of multiple joints 12/02/2019   Chronic low back pain 12/02/2019   Mixed stress and urge urinary incontinence 12/02/2019   Recurrent major depression in complete remission (HCC) 09/15/2018   Allergic rhinitis due to allergen 03/01/2017   Constipation 12/26/2016   History of bowel resection 12/26/2016   HA (headache) 11/18/2015   Memory loss 11/18/2015   Migraine headache 11/18/2015   Osteoarthritis, hip, bilateral 11/18/2015   Hypertension 09/20/2015   Hyperlipidemia 09/20/2015   Chronic pain 09/20/2015   Heart murmur 09/20/2015   Asthma, moderate persistent 09/20/2015   Post concussion syndrome 11/08/2014   Restless legs 10/05/2014    Medication Management  Current Outpatient Medications:    albuterol (PROVENTIL HFA) 108 (90 Base) MCG/ACT inhaler, Inhale 2 puffs into the lungs every 6 (six) hours as needed for wheezing or shortness of breath., Disp: 8 g, Rfl: 3   aspirin EC 81 MG tablet, Take 1 tablet (81 mg total) by mouth daily., Disp: , Rfl:    fluticasone (FLONASE) 50 MCG/ACT nasal spray, Place 2 sprays into both nostrils daily. Use for 4-6 weeks then stop and use seasonally or as needed., Disp: 16 g, Rfl: 3   omega-3 acid ethyl esters (LOVAZA) 1 g capsule, Take 1 g by mouth daily., Disp: , Rfl:    SYMBICORT 160-4.5 MCG/ACT inhaler, Inhale 2 puffs into the lungs in the morning and at bedtime., Disp: 1 each, Rfl: 12   tiZANidine (ZANAFLEX) 4 MG tablet, Take 1 tablet (4 mg total) by mouth every 8 (eight) hours as needed for muscle spasms.,  Disp: 90 tablet, Rfl: 3   traZODone (DESYREL) 50 MG tablet, Take 1 tablet (50 mg total) by mouth at bedtime as needed for sleep., Disp: 90 tablet, Rfl: 3   diclofenac Sodium (VOLTAREN ARTHRITIS PAIN) 1 % GEL, Apply 2 g topically 4 (four) times daily. To right foot for 1 week (Patient not taking: Reported on 04/15/2023), Disp: 100 g, Rfl: 0   fexofenadine (ALLEGRA) 180 MG tablet, Take 1 tablet (180 mg total) by mouth daily. (Patient not taking: Reported on 04/15/2023), Disp: 90 tablet, Rfl: 3   gabapentin (NEURONTIN) 300 MG capsule, Take 1 capsule (300 mg total) by mouth at bedtime. (Patient not taking: Reported on 04/15/2023), Disp: 90 capsule, Rfl: 3   montelukast (SINGULAIR) 10 MG tablet, Take 1 tablet (10 mg total) by mouth at bedtime. (Patient not taking: Reported on 04/15/2023), Disp: 90 tablet, Rfl: 3   nebivolol (BYSTOLIC) 5 MG tablet, Take 1 tablet (5 mg total) by mouth daily. (Patient not taking: Reported on 04/15/2023), Disp: 30 tablet, Rfl: 2   Respir Rate Dev-Bld Press Mon (RESPERATE 1.0) KIT, Use daily to help lower blood pressure, Disp: 1 kit, Rfl: 0   rosuvastatin (CRESTOR) 20 MG tablet, Take 1 tablet (20 mg total) by mouth at bedtime. (Patient not taking: Reported on 04/15/2023), Disp: 90 tablet, Rfl: 3  Cognitive Assessment Identity Confirmed: : Name; DOB Cognitive Status: Abnormal (the patient states she  forgetful and some things she cannot comprehend sometimes) Other:  : forgetful and memory changes   Functional Assessment Hearing Difficulty or Deaf: no Wear Glasses or Blind: yes Vision Management: wears glasses Concentrating, Remembering or Making Decisions Difficulty (CP): yes Concentration Management: memory issues Difficulty Communicating: no Difficulty Eating/Swallowing: no Walking or Climbing Stairs Difficulty: yes Walking or Climbing Stairs: ambulation difficulty, requires equipment Mobility Management: uses a cane, denies falls Dressing/Bathing Difficulty: no Doing  Errands Independently Difficulty (such as shopping) (CP): no   Caregiver Assessment  Primary Source of Support/Comfort: extended family Name of Support/Comfort Primary Source: Jaeleigh Gardiner- cousin People in Home: alone Family Caregiver if Needed: other relative(s) Family Caregiver Names: Madalene Thorne-cousins Primary Roles/Responsibilities: retired Concerns About Impact on Relationships: concerned about her memory and her care   Planned Interventions  Evaluation of current treatment plan related to hypertension self management and patient's adherence to plan as established by provider;   Provided education to patient re: stroke prevention, s/s of heart attack and stroke; Reviewed prescribed diet Heart healthy diet Reviewed medications with patient and discussed importance of compliance;  Discussed plans with patient for ongoing care management follow up and provided patient with direct contact information for care management team; Advised patient, providing education and rationale, to monitor blood pressure daily and record, calling PCP for findings outside established parameters;  Reviewed scheduled/upcoming provider appointments including: 04-16-2023 at 0920 am Advised patient to discuss changes in her blood pressure, new onset of dizziness, questions or concerns with provider; Provided education on prescribed diet heart healthy;  Discussed complications of poorly controlled blood pressure such as heart disease, stroke, circulatory complications, vision complications, kidney impairment, sexual dysfunction;  Screening for signs and symptoms of depression related to chronic disease state;  Assessed social determinant of health barriers;  Reviewed provider established plan for pain management; Discussed importance of adherence to all scheduled medical appointments; Counseled on the importance of reporting any/all new or changed pain symptoms or management strategies to pain management  provider; Advised patient to report to care team affect of pain on daily activities; Discussed use of relaxation techniques and/or diversional activities to assist with pain reduction (distraction, imagery, relaxation, massage, acupressure, TENS, heat, and cold application; Reviewed with patient prescribed pharmacological and nonpharmacological pain relief strategies; Advised patient to discuss unresolved pain, changes in level or intensity of pain with provider; Screening for signs and symptoms of depression related to chronic disease state;  Assessed social determinant of health barriers;  Provider established cholesterol goals reviewed; Counseled on importance of regular laboratory monitoring as prescribed; Provided HLD educational materials; Reviewed role and benefits of statin for ASCVD risk reduction; Discussed strategies to manage statin-induced myalgias; Reviewed importance of limiting foods high in cholesterol; Reviewed exercise goals and target of 150 minutes per week; Screening for signs and symptoms of depression related to chronic disease state;  Assessed social determinant of health barriers;        Interaction and coordination with outside resources, practitioners, and providers See CCM Referral  Care Plan: Printed and mailed to patient

## 2023-04-15 NOTE — Telephone Encounter (Signed)
error 

## 2023-04-15 NOTE — Patient Instructions (Addendum)
Please call the care guide team at [phone] if you need to cancel or reschedule your appointment.   If you are experiencing a Mental Health or Behavioral Health Crisis or need someone to talk to, please call the Suicide and Crisis Lifeline: 988 call the [place] National Suicide Prevention Lifeline: (531) 801-6204 or TTY: 865-252-8233 TTY 714-797-5526) to talk to a trained counselor call 1-800-273-TALK (toll free, 24 hour hotline)   Following is a copy of the CCM Program Consent:  CCM service includes personalized support from designated clinical staff supervised by the physician, including individualized plan of care and coordination with other care providers 24/7 contact phone numbers for assistance for urgent and routine care needs. Service will only be billed when office clinical staff spend 20 minutes or more in a month to coordinate care. Only one practitioner may furnish and bill the service in a calendar month. The patient may stop CCM services at amy time (effective at the end of the month) by phone call to the office staff. The patient will be responsible for cost sharing (co-pay) or up to 20% of the service fee (after annual deductible is met)  Following is a copy of your full provider care plan:   Goals Addressed             This Visit's Progress    CCM Expected outcome:  Monitor, Self-Manage and Reduce Symptoms of HLD       Current Barriers:  Care Coordination needs related to the patients chronic conditions and how to effectively manage in a patient with HLD and other chronic conditions Chronic Disease Management support and education needs related to effective management of HLD Lacks caregiver support.   Planned Interventions: Provider established cholesterol goals reviewed; Counseled on importance of regular laboratory monitoring as prescribed; Provided HLD educational materials; Reviewed role and benefits of statin for ASCVD risk reduction; Discussed strategies to  manage statin-induced myalgias; Reviewed importance of limiting foods high in cholesterol; Reviewed exercise goals and target of 150 minutes per week; Screening for signs and symptoms of depression related to chronic disease state;  Assessed social determinant of health barriers;   Symptom Management: Take medications as prescribed   Attend all scheduled provider appointments Call provider office for new concerns or questions  call the Suicide and Crisis Lifeline: 988 call the [place] National Suicide Prevention Lifeline: 7026771873 or TTY: 418-649-1935 TTY 225-424-6612) to talk to a trained counselor call 1-800-273-TALK (toll free, 24 hour hotline) if experiencing a Mental Health or Behavioral Health Crisis  - take all medications exactly as prescribed - call doctor with any symptoms you believe are related to your medicine - call doctor when you experience any new symptoms - go to all doctor appointments as scheduled - adhere to prescribed diet: heart healthy diet   Follow Up Plan: Telephone follow up appointment with care management team member scheduled for: 05-16-2023 at 0900 am       CCM Expected Outcome:  Monitor, Self-Manage and Reduce Symptoms of: Arthritis       Current Barriers:  Knowledge Deficits related to how to effectively manage pain and discomfort Chronic Disease Management support and education needs related to chronic pain and discomfort  Planned Interventions: Reviewed provider established plan for pain management; Discussed importance of adherence to all scheduled medical appointments; Counseled on the importance of reporting any/all new or changed pain symptoms or management strategies to pain management provider; Advised patient to report to care team affect of pain on daily activities; Discussed use of  relaxation techniques and/or diversional activities to assist with pain reduction (distraction, imagery, relaxation, massage, acupressure, TENS, heat, and cold  application; Reviewed with patient prescribed pharmacological and nonpharmacological pain relief strategies; Advised patient to discuss unresolved pain, changes in level or intensity of pain with provider; Screening for signs and symptoms of depression related to chronic disease state;  Assessed social determinant of health barriers;   Symptom Management: Take medications as prescribed   Attend all scheduled provider appointments Call provider office for new concerns or questions  call the Suicide and Crisis Lifeline: 988 call the [place] National Suicide Prevention Lifeline: (325)298-6780 or TTY: (905)013-2917 TTY (205) 433-4425) to talk to a trained counselor call 1-800-273-TALK (toll free, 24 hour hotline) if experiencing a Mental Health or Behavioral Health Crisis   Follow Up Plan: Telephone follow up appointment with care management team member scheduled for: 05-16-2023 at 0900 am       CCM Expected Outcome:  Monitor, Self-Manage, and Reduce Symptoms of Hypertension       Current Barriers:  Knowledge Deficits related to medications and side effects of medications and the importance of reporting new sx and sx to the provider when they occur Care Coordination needs related to resources to help the patient in her home and in managing her health and well being in a patient with HTN and other chronic conditions Chronic Disease Management support and education needs related to effective management of HTN Lacks caregiver support.  Cognitive Deficits  BP Readings from Last 3 Encounters:  04/02/23 (!) 154/90  03/25/23 (!) 166/86  11/19/22 (!) 187/84    Planned Interventions: Evaluation of current treatment plan related to hypertension self management and patient's adherence to plan as established by provider;   Provided education to patient re: stroke prevention, s/s of heart attack and stroke; Reviewed prescribed diet Heart healthy diet Reviewed medications with patient and discussed  importance of compliance;  Discussed plans with patient for ongoing care management follow up and provided patient with direct contact information for care management team; Advised patient, providing education and rationale, to monitor blood pressure daily and record, calling PCP for findings outside established parameters;  Reviewed scheduled/upcoming provider appointments including: 04-16-2023 at 0920 am Advised patient to discuss changes in her blood pressure, new onset of dizziness, questions or concerns with provider; Provided education on prescribed diet heart healthy;  Discussed complications of poorly controlled blood pressure such as heart disease, stroke, circulatory complications, vision complications, kidney impairment, sexual dysfunction;  Screening for signs and symptoms of depression related to chronic disease state;  Assessed social determinant of health barriers;   Symptom Management: Take medications as prescribed   Attend all scheduled provider appointments Call provider office for new concerns or questions  call the Suicide and Crisis Lifeline: 988 call the [place] National Suicide Prevention Lifeline: 628-448-8936 or TTY: 579-267-6727 TTY 901-766-4726) to talk to a trained counselor call 1-800-273-TALK (toll free, 24 hour hotline) if experiencing a Mental Health or Behavioral Health Crisis  check blood pressure daily write blood pressure results in a log or diary learn about high blood pressure take blood pressure log to all doctor appointments call doctor for signs and symptoms of high blood pressure develop an action plan for high blood pressure keep all doctor appointments take medications for blood pressure exactly as prescribed report new symptoms to your doctor  Follow Up Plan: Telephone follow up appointment with care management team member scheduled for: 05-16-2023 at 0900 am  The patient verbalized understanding of instructions, educational  materials, and care plan provided today and agreed to receive a mailed copy of patient instructions, educational materials, and care plan.  Telephone follow up appointment with care management team member scheduled for:  05-16-2023 at 0900 am  DASH Eating Plan DASH stands for Dietary Approaches to Stop Hypertension. The DASH eating plan is a healthy eating plan that has been shown to: Lower high blood pressure (hypertension). Reduce your risk for type 2 diabetes, heart disease, and stroke. Help with weight loss. What are tips for following this plan? Reading food labels Check food labels for the amount of salt (sodium) per serving. Choose foods with less than 5 percent of the Daily Value (DV) of sodium. In general, foods with less than 300 milligrams (mg) of sodium per serving fit into this eating plan. To find whole grains, look for the word "whole" as the first word in the ingredient list. Shopping Buy products labeled as "low-sodium" or "no salt added." Buy fresh foods. Avoid canned foods and pre-made or frozen meals. Cooking Try not to add salt when you cook. Use salt-free seasonings or herbs instead of table salt or sea salt. Check with your health care provider or pharmacist before using salt substitutes. Do not fry foods. Cook foods in healthy ways, such as baking, boiling, grilling, roasting, or broiling. Cook using oils that are good for your heart. These include olive, canola, avocado, soybean, and sunflower oil. Meal planning  Eat a balanced diet. This should include: 4 or more servings of fruits and 4 or more servings of vegetables each day. Try to fill half of your plate with fruits and vegetables. 6-8 servings of whole grains each day. 6 or less servings of lean meat, poultry, or fish each day. 1 oz is 1 serving. A 3 oz (85 g) serving of meat is about the same size as the palm of your hand. One egg is 1 oz (28 g). 2-3 servings of low-fat dairy each day. One serving is 1 cup  (237 mL). 1 serving of nuts, seeds, or beans 5 times each week. 2-3 servings of heart-healthy fats. Healthy fats called omega-3 fatty acids are found in foods such as walnuts, flaxseeds, fortified milks, and eggs. These fats are also found in cold-water fish, such as sardines, salmon, and mackerel. Limit how much you eat of: Canned or prepackaged foods. Food that is high in trans fat, such as fried foods. Food that is high in saturated fat, such as fatty meat. Desserts and other sweets, sugary drinks, and other foods with added sugar. Full-fat dairy products. Do not salt foods before eating. Do not eat more than 4 egg yolks a week. Try to eat at least 2 vegetarian meals a week. Eat more home-cooked food and less restaurant, buffet, and fast food. Lifestyle When eating at a restaurant, ask if your food can be made with less salt or no salt. If you drink alcohol: Limit how much you have to: 0-1 drink a day if you are female. 0-2 drinks a day if you are female. Know how much alcohol is in your drink. In the [place] one drink is one 12 oz bottle of beer (355 mL), one 5 oz glass of wine (148 mL), or one 1 oz glass of hard liquor (44 mL). General information Avoid eating more than 2,300 mg of salt a day. If you have hypertension, you may need to reduce your sodium intake to 1,500 mg a day. Work with  your provider to stay at a healthy body weight or lose weight. Ask what the best weight range is for you. On most days of the week, get at least 30 minutes of exercise that causes your heart to beat faster. This may include walking, swimming, or biking. Work with your provider or dietitian to adjust your eating plan to meet your specific calorie needs. What foods should I eat? Fruits All fresh, dried, or frozen fruit. Canned fruits that are in their natural juice and do not have sugar added to them. Vegetables Fresh or frozen vegetables that are raw, steamed, roasted, or grilled. Low-sodium or  reduced-sodium tomato and vegetable juice. Low-sodium or reduced-sodium tomato sauce and tomato paste. Low-sodium or reduced-sodium canned vegetables. Grains Whole-grain or whole-wheat bread. Whole-grain or whole-wheat pasta. Brown rice. Orpah Cobb. Bulgur. Whole-grain and low-sodium cereals. Pita bread. Low-fat, low-sodium crackers. Whole-wheat flour tortillas. Meats and other proteins Skinless chicken or [place]. Ground chicken or [place]. Pork with fat trimmed off. Fish and seafood. Egg whites. Dried beans, peas, or lentils. Unsalted nuts, nut butters, and seeds. Unsalted canned beans. Lean cuts of beef with fat trimmed off. Low-sodium, lean precooked or cured meat, such as sausages or meat loaves. Dairy Low-fat (1%) or fat-free (skim) milk. Reduced-fat, low-fat, or fat-free cheeses. Nonfat, low-sodium ricotta or cottage cheese. Low-fat or nonfat yogurt. Low-fat, low-sodium cheese. Fats and oils Soft margarine without trans fats. Vegetable oil. Reduced-fat, low-fat, or light mayonnaise and salad dressings (reduced-sodium). Canola, safflower, olive, avocado, soybean, and sunflower oils. Avocado. Seasonings and condiments Herbs. Spices. Seasoning mixes without salt. Other foods Unsalted popcorn and pretzels. Fat-free sweets. The items listed above may not be all the foods and drinks you can have. Talk to a dietitian to learn more. What foods should I avoid? Fruits Canned fruit in a light or heavy syrup. Fried fruit. Fruit in cream or butter sauce. Vegetables Creamed or fried vegetables. Vegetables in a cheese sauce. Regular canned vegetables that are not marked as low-sodium or reduced-sodium. Regular canned tomato sauce and paste that are not marked as low-sodium or reduced-sodium. Regular tomato and vegetable juices that are not marked as low-sodium or reduced-sodium. Rosita Fire. Olives. Grains Baked goods made with fat, such as croissants, muffins, or some breads. Dry pasta or rice meal  packs. Meats and other proteins Fatty cuts of meat. Ribs. Fried meat. Tomasa Blase. Bologna, salami, and other precooked or cured meats, such as sausages or meat loaves, that are not lean and low in sodium. Fat from the back of a pig (fatback). Bratwurst. Salted nuts and seeds. Canned beans with added salt. Canned or smoked fish. Whole eggs or egg yolks. Chicken or [place] with skin. Dairy Whole or 2% milk, cream, and half-and-half. Whole or full-fat cream cheese. Whole-fat or sweetened yogurt. Full-fat cheese. Nondairy creamers. Whipped toppings. Processed cheese and cheese spreads. Fats and oils Butter. Stick margarine. Lard. Shortening. Ghee. Bacon fat. Tropical oils, such as coconut, palm kernel, or palm oil. Seasonings and condiments Onion salt, garlic salt, seasoned salt, table salt, and sea salt. Worcestershire sauce. Tartar sauce. Barbecue sauce. Teriyaki sauce. Soy sauce, including reduced-sodium soy sauce. Steak sauce. Canned and packaged gravies. Fish sauce. Oyster sauce. Cocktail sauce. Store-bought horseradish. Ketchup. Mustard. Meat flavorings and tenderizers. Bouillon cubes. Hot sauces. Pre-made or packaged marinades. Pre-made or packaged taco seasonings. Relishes. Regular salad dressings. Other foods Salted popcorn and pretzels. The items listed above may not be all the foods and drinks you should avoid. Talk to a dietitian to learn more.  Where to find more information National Heart, Lung, and Blood Institute (NHLBI): BuffaloDryCleaner.gl American Heart Association (AHA): heart.org Academy of Nutrition and Dietetics: eatright.org National Kidney Foundation (NKF): kidney.org This information is not intended to replace advice given to you by your health care provider. Make sure you discuss any questions you have with your health care provider. Document Revised: 11/08/2022 Document Reviewed: 11/08/2022 Elsevier Patient Education  2024 Elsevier Inc. High Cholesterol  High cholesterol is a  condition in which the blood has high levels of a white, waxy substance similar to fat (cholesterol). The liver makes all the cholesterol that the body needs. The human body needs small amounts of cholesterol to help build cells. A person gets extra or excess cholesterol from the food that he or she eats. The blood carries cholesterol from the liver to the rest of the body. If you have high cholesterol, deposits (plaques) may build up on the walls of your arteries. Arteries are the blood vessels that carry blood away from your heart. These plaques make the arteries narrow and stiff. Cholesterol plaques increase your risk for heart attack and stroke. Work with your health care provider to keep your cholesterol levels in a healthy range. What increases the risk? The following factors may make you more likely to develop this condition: Eating foods that are high in animal fat (saturated fat) or cholesterol. Being overweight. Not getting enough exercise. A family history of high cholesterol (familial hypercholesterolemia). Use of tobacco products. Having diabetes. What are the signs or symptoms? In most cases, high cholesterol does not usually cause any symptoms. In severe cases, very high cholesterol levels can cause: Fatty bumps under the skin (xanthomas). A white or gray ring around the black center (pupil) of the eye. How is this diagnosed? This condition may be diagnosed based on the results of a blood test. If you are older than 74 years of age, your health care provider may check your cholesterol levels every 4-6 years. You may be checked more often if you have high cholesterol or other risk factors for heart disease. The blood test for cholesterol measures: "Bad" cholesterol, or LDL cholesterol. This is the main type of cholesterol that causes heart disease. The desired level is less than 100 mg/dL (3.47 mmol/L). "Good" cholesterol, or HDL cholesterol. HDL helps protect against heart  disease by cleaning the arteries and carrying the LDL to the liver for processing. The desired level for HDL is 60 mg/dL (4.25 mmol/L) or higher. Triglycerides. These are fats that your body can store or burn for energy. The desired level is less than 150 mg/dL (9.56 mmol/L). Total cholesterol. This measures the total amount of cholesterol in your blood and includes LDL, HDL, and triglycerides. The desired level is less than 200 mg/dL (3.87 mmol/L). How is this treated? Treatment for high cholesterol starts with lifestyle changes, such as diet and exercise. Diet changes. You may be asked to eat foods that have more fiber and less saturated fats or added sugar. Lifestyle changes. These may include regular exercise, maintaining a healthy weight, and quitting use of tobacco products. Medicines. These are given when diet and lifestyle changes have not worked. You may be prescribed a statin medicine to help lower your cholesterol levels. Follow these instructions at home: Eating and drinking  Eat a healthy, balanced diet. This diet includes: Daily servings of a variety of fresh, frozen, or canned fruits and vegetables. Daily servings of whole grain foods that are rich in fiber. Foods that are low  in saturated fats and trans fats. These include poultry and fish without skin, lean cuts of meat, and low-fat dairy products. A variety of fish, especially oily fish that contain omega-3 fatty acids. Aim to eat fish at least 2 times a week. Avoid foods and drinks that have added sugar. Use healthy cooking methods, such as roasting, grilling, broiling, baking, poaching, steaming, and stir-frying. Do not fry your food except for stir-frying. If you drink alcohol: Limit how much you have to: 0-1 drink a day for women who are not pregnant. 0-2 drinks a day for men. Know how much alcohol is in a drink. In the [place] one drink equals one 12 oz bottle of beer (355 mL), one 5 oz glass of wine (148 mL), or one 1 oz  glass of hard liquor (44 mL). Lifestyle  Get regular exercise. Aim to exercise for a total of 150 minutes a week. Increase your activity level by doing activities such as gardening, walking, and taking the stairs. Do not use any products that contain nicotine or tobacco. These products include cigarettes, chewing tobacco, and vaping devices, such as e-cigarettes. If you need help quitting, ask your health care provider. General instructions Take over-the-counter and prescription medicines only as told by your health care provider. Keep all follow-up visits. This is important. Where to find more information American Heart Association: www.heart.org National Heart, Lung, and Blood Institute: PopSteam.is Contact a health care provider if: You have trouble achieving or maintaining a healthy diet or weight. You are starting an exercise program. You are unable to stop smoking. Get help right away if: You have chest pain. You have trouble breathing. You have discomfort or pain in your jaw, neck, back, shoulder, or arm. You have any symptoms of a stroke. "BE FAST" is an easy way to remember the main warning signs of a stroke: B - Balance. Signs are dizziness, sudden trouble walking, or loss of balance. E - Eyes. Signs are trouble seeing or a sudden change in vision. F - Face. Signs are sudden weakness or numbness of the face, or the face or eyelid drooping on one side. A - Arms. Signs are weakness or numbness in an arm. This happens suddenly and usually on one side of the body. S - Speech. Signs are sudden trouble speaking, slurred speech, or trouble understanding what people say. T - Time. Time to call emergency services. Write down what time symptoms started. You have other signs of a stroke, such as: A sudden, severe headache with no known cause. Nausea or vomiting. Seizure. These symptoms may represent a serious problem that is an emergency. Do not wait to see if the symptoms will go  away. Get medical help right away. Call your local emergency services (911 in the [place] Do not drive yourself to the hospital. Summary Cholesterol plaques increase your risk for heart attack and stroke. Work with your health care provider to keep your cholesterol levels in a healthy range. Eat a healthy, balanced diet, get regular exercise, and maintain a healthy weight. Do not use any products that contain nicotine or tobacco. These products include cigarettes, chewing tobacco, and vaping devices, such as e-cigarettes. Get help right away if you have any symptoms of a stroke. This information is not intended to replace advice given to you by your health care provider. Make sure you discuss any questions you have with your health care provider. Document Revised: 05/25/2022 Document Reviewed: 12/26/2020 Elsevier Patient Education  2024 Elsevier Inc. Blood Pressure Record  Sheet To take your blood pressure, you will need a blood pressure machine. You may be prescribed one, or you can buy a blood pressure machine (blood pressure monitor) at your clinic, drug store, or online. When choosing one, look for these features: An automatic monitor that has an arm cuff. A cuff that wraps snugly, but not too tightly, around your upper arm. You should be able to fit only one finger between your arm and the cuff. A device that stores blood pressure reading results. Do not choose a monitor that measures your blood pressure from your wrist or finger. Follow your health care provider's instructions for how to take your blood pressure. To use this form: Get one reading in the morning (a.m.) before you take any medicines. Get one reading in the evening (p.m.) before supper. Take at least two readings with each blood pressure check. This makes sure the results are correct. Wait 1-2 minutes between measurements. Write down the results in the spaces on this form. Repeat this once a week, or as told by your health care  provider. Make a follow-up appointment with your health care provider to discuss the results. Blood pressure log Date: _______________________ a.m. _____________________(1st reading) _____________________(2nd reading) p.m. _____________________(1st reading) _____________________(2nd reading) Date: _______________________ a.m. _____________________(1st reading) _____________________(2nd reading) p.m. _____________________(1st reading) _____________________(2nd reading) Date: _______________________ a.m. _____________________(1st reading) _____________________(2nd reading) p.m. _____________________(1st reading) _____________________(2nd reading) Date: _______________________ a.m. _____________________(1st reading) _____________________(2nd reading) p.m. _____________________(1st reading) _____________________(2nd reading) Date: _______________________ a.m. _____________________(1st reading) _____________________(2nd reading) p.m. _____________________(1st reading) _____________________(2nd reading) This information is not intended to replace advice given to you by your health care provider. Make sure you discuss any questions you have with your health care provider. Document Revised: 07/06/2021 Document Reviewed: 07/06/2021 Elsevier Patient Education  2024 [employer] Hypertension, Adult Hypertension is another name for high blood pressure. High blood pressure forces your heart to work harder to pump blood. This can cause problems over time. There are two numbers in a blood pressure reading. There is a top number (systolic) over a bottom number (diastolic). It is best to have a blood pressure that is below 120/80. What are the causes? The cause of this condition is not known. Some other conditions can lead to high blood pressure. What increases the risk? Some lifestyle factors can make you more likely to develop high blood pressure: Smoking. Not getting enough exercise or physical  activity. Being overweight. Having too much fat, sugar, calories, or salt (sodium) in your diet. Drinking too much alcohol. Other risk factors include: Having any of these conditions: Heart disease. Diabetes. High cholesterol. Kidney disease. Obstructive sleep apnea. Having a family history of high blood pressure and high cholesterol. Age. The risk increases with age. Stress. What are the signs or symptoms? High blood pressure may not cause symptoms. Very high blood pressure (hypertensive crisis) may cause: Headache. Fast or uneven heartbeats (palpitations). Shortness of breath. Nosebleed. Vomiting or feeling like you may vomit (nauseous). Changes in how you see. Very bad chest pain. Feeling dizzy. Seizures. How is this treated? This condition is treated by making healthy lifestyle changes, such as: Eating healthy foods. Exercising more. Drinking less alcohol. Your doctor may prescribe medicine if lifestyle changes do not help enough and if: Your top number is above 130. Your bottom number is above 80. Your personal target blood pressure may vary. Follow these instructions at home: Eating and drinking  If told, follow the DASH eating plan. To follow this plan: Fill one half of your plate at  each meal with fruits and vegetables. Fill one fourth of your plate at each meal with whole grains. Whole grains include whole-wheat pasta, brown rice, and whole-grain bread. Eat or drink low-fat dairy products, such as skim milk or low-fat yogurt. Fill one fourth of your plate at each meal with low-fat (lean) proteins. Low-fat proteins include fish, chicken without skin, eggs, beans, and tofu. Avoid fatty meat, cured and processed meat, or chicken with skin. Avoid pre-made or processed food. Limit the amount of salt in your diet to less than 1,500 mg each day. Do not drink alcohol if: Your doctor tells you not to drink. You are pregnant, may be pregnant, or are planning to become  pregnant. If you drink alcohol: Limit how much you have to: 0-1 drink a day for women. 0-2 drinks a day for men. Know how much alcohol is in your drink. In the [place] one drink equals one 12 oz bottle of beer (355 mL), one 5 oz glass of wine (148 mL), or one 1 oz glass of hard liquor (44 mL). Lifestyle  Work with your doctor to stay at a healthy weight or to lose weight. Ask your doctor what the best weight is for you. Get at least 30 minutes of exercise that causes your heart to beat faster (aerobic exercise) most days of the week. This may include walking, swimming, or biking. Get at least 30 minutes of exercise that strengthens your muscles (resistance exercise) at least 3 days a week. This may include lifting weights or doing Pilates. Do not smoke or use any products that contain nicotine or tobacco. If you need help quitting, ask your doctor. Check your blood pressure at home as told by your doctor. Keep all follow-up visits. Medicines Take over-the-counter and prescription medicines only as told by your doctor. Follow directions carefully. Do not skip doses of blood pressure medicine. The medicine does not work as well if you skip doses. Skipping doses also puts you at risk for problems. Ask your doctor about side effects or reactions to medicines that you should watch for. Contact a doctor if: You think you are having a reaction to the medicine you are taking. You have headaches that keep coming back. You feel dizzy. You have swelling in your ankles. You have trouble with your vision. Get help right away if: You get a very bad headache. You start to feel mixed up (confused). You feel weak or numb. You feel faint. You have very bad pain in your: Chest. Belly (abdomen). You vomit more than once. You have trouble breathing. These symptoms may be an emergency. Get help right away. Call 911. Do not wait to see if the symptoms will go away. Do not drive yourself to the  hospital. Summary Hypertension is another name for high blood pressure. High blood pressure forces your heart to work harder to pump blood. For most people, a normal blood pressure is less than 120/80. Making healthy choices can help lower blood pressure. If your blood pressure does not get lower with healthy choices, you may need to take medicine. This information is not intended to replace advice given to you by your health care provider. Make sure you discuss any questions you have with your health care provider. Document Revised: 08/10/2021 Document Reviewed: 08/10/2021 Elsevier Patient Education  2024 [employer].

## 2023-04-15 NOTE — Telephone Encounter (Signed)
The pt came into the office today because she thought her telephone chronic care management appointment was in the office. She complained of intermittent dizziness when she was taking her Nebivolol 5 MG. She stopped taking the medication three days ago. Blood pressure today 174/95, Pulse 56. Reports no symptoms since stopping the medication. An appointment is scheduled for tomorrow, Tuesday, June 11th to address blood pressure.

## 2023-04-15 NOTE — Telephone Encounter (Signed)
See Laurel Dimmer CMA's note regarding this patient today. I advised her to remain off med if causing side effect, will follow up tomorrow 6/11 at next apt.  She will be contacted by Chronic Care Management team as well.  Saralyn Pilar, DO [facility] 04/15/2023, 5:28 PM
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
## 2023-04-16 ENCOUNTER — Telehealth: Payer: Self-pay

## 2023-04-16 ENCOUNTER — Ambulatory Visit: Payer: Self-pay

## 2023-04-16 ENCOUNTER — Ambulatory Visit: Payer: 59 | Admitting: Family Medicine

## 2023-04-16 NOTE — Patient Outreach (Signed)
  Care Coordination   Follow Up Visit Note   04/16/2023 Name: Kirsten [name] MRN: [id number] DOB: February 27, 1946  Kirsten [name] is a 77 y.o. year old female who sees Smitty Cords, DO for primary care. I spoke with  [name] by phone today.  What matters to the patients health and wellness today?  Patient reports concerns with her last visit to the practice regarding wait time for her appointment.  Patient is suggested to share her concerns with the practice manager and apologized that she was not satisfied with the visit. Patient wants help in the home due to memory issues and blood pressure issues.  Patient will see the doctor tomorrow and will request an order for personal care.    Goals Addressed             This Visit's Progress    Obtain personal care services       -Patient wants assistance with medication management, cooking, monitoring blood pressure, managing appointments -Patient will speak to provider on 04/17/23 to request order        SDOH assessments and interventions completed:  Yes    Interventions Today    Flowsheet Row Most Recent Value  Chronic Disease   Chronic disease during today's visit Other  [Asthma, concussion,]  General Interventions   General Interventions Discussed/Reviewed General Interventions Reviewed  [facility] care services will need to be ordered by provider, a list will be provided to search for personal care options.]        Care Coordination Interventions:  Yes, provided  Care Coordination Interventions: SW agreed to provide a list by mail once provider approves order  Follow up plan: Follow up call scheduled for 04/18/23 at 10:30am    Encounter Outcome:  Pt. Visit Completed

## 2023-04-16 NOTE — Telephone Encounter (Signed)
Patient arrived at 8:18 for 9:20 appointment.  Front desk made patient aware that her appointment is at 9:20 and she has some patients in front of her.  Patient was ok with waiting.    Patient came up to the window at 9:15 and was upset because other patients was being brought back before her.  Front desk tried to explain to her that she was here early and they would be out for her in a minute. Patient throw the clipboard at the front desk employee and left the building.  CMA came out at 9:18 to call the patient back and patient had already left the building.   Copied from CRM [id number]. Topic: Complaint - Care >> Apr 16, 2023  9:35 AM [place] B wrote: Date of Incident: 04/16/23 Details of complaint: patient says was at her appt early and another patient was taken in front of her, Also states front desk person was talking over her. How would the patient like to see it resolved? She wants this issue addressed by Dr Kirtland Bouchard or be assigned another Dr On a scale of 1-10, how was your experience? 1 What would it take to bring it to a 10? Wants to feel acknowledged and not talked over and feel like she is cared for  Route to Research officer, political party. >> Apr 16, 2023 11:20 AM Danella Maiers wrote: Spoke with patient and she was upset because a patient came in and said she was here to check in for a 10:00 appointment.  That patient was taking back before her.  I explained to the patient that the other person at 10:00 was for the other provider.  She also complained that the front desk person gives her different times for appointment on printouts.  I explained to the patient that there is a appointment time and an arrival time.  Patient was very upset on the phone and raising her voice the entire phone call.  I was able to make a new appointment for her with Dr. Kirtland Bouchard on Wednesday, 6/12 at 2:00.  Patient verbal understood instructions of appointment.

## 2023-04-16 NOTE — Patient Instructions (Signed)
Visit Information  Thank you for taking time to visit with me today. Please don't hesitate to contact me if I can be of assistance to you.   Following are the goals we discussed today:   Goals Addressed             This Visit's Progress    Obtain personal care services       -Patient wants assistance with medication management, cooking, monitoring blood pressure, managing appointments -Patient will speak to provider on 04/17/23 to request order        Our next appointment is by telephone on 04/18/23 at 10:30 am  Please call the care guide team at [phone] if you need to cancel or reschedule your appointment.   If you are experiencing a Mental Health or Behavioral Health Crisis or need someone to talk to, please call 911  Patient verbalizes understanding of instructions and care plan provided today and agrees to view in MyChart. Active MyChart status and patient understanding of how to access instructions and care plan via MyChart confirmed with patient.     Telephone follow up appointment with care management team member scheduled for: 04/18/23 at 10:30am.  Lysle Morales, BSW Social Worker [facility] Care Management  [phone]

## 2023-04-17 ENCOUNTER — Encounter: Payer: Self-pay | Admitting: Family Medicine

## 2023-04-17 ENCOUNTER — Ambulatory Visit (INDEPENDENT_AMBULATORY_CARE_PROVIDER_SITE_OTHER): Payer: 59 | Admitting: Family Medicine

## 2023-04-17 ENCOUNTER — Other Ambulatory Visit: Payer: Self-pay | Admitting: Family Medicine

## 2023-04-17 VITALS — BP 168/88 | HR 63 | Temp 96.8°F | Wt 115.0 lb

## 2023-04-17 DIAGNOSIS — J3089 Other allergic rhinitis: Secondary | ICD-10-CM

## 2023-04-17 DIAGNOSIS — I10 Essential (primary) hypertension: Secondary | ICD-10-CM

## 2023-04-17 MED ORDER — LORATADINE 10 MG PO TABS
10.0000 mg | ORAL_TABLET | Freq: Every day | ORAL | 11 refills | Status: AC
Start: 2023-04-17 — End: ?

## 2023-04-17 MED ORDER — CARVEDILOL 6.25 MG PO TABS
6.2500 mg | ORAL_TABLET | Freq: Two times a day (BID) | ORAL | 1 refills | Status: DC
Start: 2023-04-17 — End: 2023-07-01

## 2023-04-17 NOTE — Progress Notes (Signed)
Subjective:    Patient ID: Kirsten [name], female    DOB: 08-Oct-1946, 77 y.o.   MRN: [id number]  Kirsten [name] is a 77 y.o. female presenting on 04/17/2023 for Hypertension   HPI  CHRONIC HTN: Working with CCM team for BP followup Has had issues with self discontinue meds w/ side effects Previous ACEi cough on benazepril, HCTZ thiazide allergy, Losartan, Amlodipine ineffective.  Last visit 04/02/23 -  Tried Bystolic, then reported side effects with dizziness.   Recent updates -  She was previously on Valsartan ARB and felt a side effect throbbing headache while on med, she has discontinued it recently about 2+ weeks ago and has done better with resolved side effect. But still has higher BP   Current Med - NONE currently Denies CP, dyspnea, HA, edema, dizziness / lightheadedness       04/17/2023    2:01 PM 04/15/2023    3:58 PM 11/07/2022    1:39 PM  Depression screen PHQ 2/9  Decreased Interest 0 0 0  Down, Depressed, Hopeless 0 0 0  PHQ - 2 Score 0 0 0  Altered sleeping 1  0  Tired, decreased energy 0  1  Change in appetite 1  1  Feeling bad or failure about yourself  0  0  Trouble concentrating 1  2  Moving slowly or fidgety/restless 0  0  Suicidal thoughts 0  0  PHQ-9 Score 3  4  Difficult doing work/chores Somewhat difficult  Not difficult at all    Social History   Tobacco Use   Smoking status: Never   Smokeless tobacco: Never  Vaping Use   Vaping Use: Never used  Substance Use Topics   Alcohol use: No    Alcohol/week: 0.0 standard drinks of alcohol   Drug use: No    Review of Systems Per HPI unless specifically indicated above     Objective:    BP (!) 168/88 (BP Location: Left Arm, Cuff Size: Normal)   Pulse 63   Temp (!) 96.8 F (36 C) (Temporal)   Wt 115 lb (52.2 kg)   SpO2 98%   BMI 21.73 kg/m   Wt Readings from Last 3 Encounters:  04/17/23 115 lb (52.2 kg)  04/02/23 117 lb (53.1 kg)  03/25/23 119 lb (54 kg)    Physical Exam Vitals  and nursing note reviewed.  Constitutional:      General: She is not in acute distress.    Appearance: She is well-developed. She is not diaphoretic.     Comments: Well-appearing, comfortable, cooperative  HENT:     Head: Normocephalic and atraumatic.  Eyes:     General:        Right eye: No discharge.        Left eye: No discharge.     Conjunctiva/sclera: Conjunctivae normal.  Neck:     Thyroid: No thyromegaly.  Cardiovascular:     Rate and Rhythm: Normal rate and regular rhythm.     Heart sounds: Normal heart sounds. No murmur heard. Pulmonary:     Effort: Pulmonary effort is normal. No respiratory distress.     Breath sounds: Normal breath sounds. No wheezing or rales.  Musculoskeletal:        General: Normal range of motion.     Cervical back: Normal range of motion and neck supple.  Lymphadenopathy:     Cervical: No cervical adenopathy.  Skin:    General: Skin is warm and dry.  Findings: No erythema or rash.  Neurological:     Mental Status: She is alert and oriented to person, place, and time.  Psychiatric:        Behavior: Behavior normal.     Comments: Well groomed, good eye contact, normal speech and thoughts       Results for orders placed or performed in visit on 05/01/22  COMPLETE METABOLIC PANEL WITH GFR  Result Value Ref Range   Glucose, Bld 86 65 - 139 mg/dL   BUN 17 7 - 25 mg/dL   Creat 2.84 1.32 - 4.40 mg/dL   eGFR 61 > OR = 60 NU/UVO/5.36U4   BUN/Creatinine Ratio NOT APPLICABLE 6 - 22 (calc)   Sodium 143 135 - 146 mmol/L   Potassium 4.6 3.5 - 5.3 mmol/L   Chloride 109 98 - 110 mmol/L   CO2 26 20 - 32 mmol/L   Calcium 9.3 8.6 - 10.4 mg/dL   Total Protein 6.4 6.1 - 8.1 g/dL   Albumin 4.1 3.6 - 5.1 g/dL   Globulin 2.3 1.9 - 3.7 g/dL (calc)   AG Ratio 1.8 1.0 - 2.5 (calc)   Total Bilirubin 0.3 0.2 - 1.2 mg/dL   Alkaline phosphatase (APISO) 68 37 - 153 U/L   AST 19 10 - 35 U/L   ALT 13 6 - 29 U/L  CBC with Differential/Platelet  Result Value  Ref Range   WBC 8.6 3.8 - 10.8 Thousand/uL   RBC 4.78 3.80 - 5.10 Million/uL   Hemoglobin 12.4 11.7 - 15.5 g/dL   HCT 40.3 47.4 - 25.9 %   MCV 82.0 80.0 - 100.0 fL   MCH 25.9 (L) 27.0 - 33.0 pg   MCHC 31.6 (L) 32.0 - 36.0 g/dL   RDW 56.3 (H) 87.5 - 64.3 %   Platelets 319 140 - 400 Thousand/uL   MPV 10.9 7.5 - 12.5 fL   Neutro Abs 5,513 1,500 - 7,800 cells/uL   Lymphs Abs 2,417 850 - 3,900 cells/uL   Absolute Monocytes 533 200 - 950 cells/uL   Eosinophils Absolute 112 15 - 500 cells/uL   Basophils Absolute 26 0 - 200 cells/uL   Neutrophils Relative % 64.1 %   Total Lymphocyte 28.1 %   Monocytes Relative 6.2 %   Eosinophils Relative 1.3 %   Basophils Relative 0.3 %  Lipid panel  Result Value Ref Range   Cholesterol 211 (H) <200 mg/dL   HDL 53 > OR = 50 mg/dL   Triglycerides 73 <329 mg/dL   LDL Cholesterol (Calc) 141 (H) mg/dL (calc)   Total CHOL/HDL Ratio 4.0 <5.0 (calc)   Non-HDL Cholesterol (Calc) 158 (H) <130 mg/dL (calc)  Hemoglobin J1O  Result Value Ref Range   Hgb A1c MFr Bld 5.6 <5.7 % of total Hgb   Mean Plasma Glucose 114 mg/dL   eAG (mmol/L) 6.3 mmol/L      Assessment & Plan:   Problem List Items Addressed This Visit     Allergic rhinitis due to allergen   Relevant Medications   loratadine (CLARITIN) 10 MG tablet   Hypertension - Primary   Relevant Medications   carvedilol (COREG) 6.25 MG tablet    Remain off Bystolic 5mg  due to side effect  Trial on Carvedilol 6.25mg  twice a day with meal for blood pressure.  New rx allergy pill Loratadine, you can get this with your card.  Keep apts  Meds ordered this encounter  Medications   loratadine (CLARITIN) 10 MG tablet    Sig: Take  1 tablet (10 mg total) by mouth daily.    Dispense:  30 tablet    Refill:  11   carvedilol (COREG) 6.25 MG tablet    Sig: Take 1 tablet (6.25 mg total) by mouth 2 (two) times daily with a meal.    Dispense:  60 tablet    Refill:  1      Follow up plan: Return for keep  up coming appointments.  Kirsten Pilar, DO [facility] 04/17/2023, 2:15 PM

## 2023-04-17 NOTE — Patient Instructions (Addendum)
Thank you for coming to the office today.  Remain off Bystolic 5mg  due to side effect  Trial on Carvedilol 6.25mg  twice a day with meal for blood pressure.  New rx allergy pill Loratadine, you can get this with your card.  Keep apts  DUE for FASTING BLOOD WORK (no food or drink after midnight before the lab appointment, only water or coffee without cream/sugar on the morning of)   Please schedule a Follow-up Appointment to: Return for keep up coming appointments.  If you have any other questions or concerns, please feel free to call the office or send a message through MyChart. You may also schedule an earlier appointment if necessary.  Additionally, you may be receiving a survey about your experience at our office within a few days to 1 week by e-mail or mail. We value your feedback.  Saralyn Pilar, DO [facility], [place]

## 2023-04-18 ENCOUNTER — Encounter: Payer: 59 | Admitting: *Deleted

## 2023-04-18 ENCOUNTER — Ambulatory Visit: Payer: Self-pay

## 2023-04-18 NOTE — Patient Outreach (Signed)
  Care Coordination   Follow Up Visit Note   04/18/2023 Name: NEKITA PITA MRN: [id number] DOB: December 16, 1945  FIFI SCHINDLER is a 77 y.o. year old female who sees Smitty Cords, DO for primary care. I spoke with  [name] by phone today.  What matters to the patients health and wellness today?  Patient visited the doctor on 6/12 but did not address her request for personal care.  Her blood pressure rx was changed and she forgot to address.  Patient has another visit to the doctor tomorrow and will request the order.  F/up apt 6/17 at 10:30am.    Goals Addressed   None     SDOH assessments and interventions completed:  No     Care Coordination Interventions:  No, not indicated   Follow up plan: Follow up call scheduled for 04/22/23 at 10:30am    Encounter Outcome:  Pt. Visit Completed

## 2023-04-18 NOTE — Patient Instructions (Signed)
Visit Information  Thank you for taking time to visit with me today. Please don't hesitate to contact me if I can be of assistance to you.   Following are the goals we discussed today:   Goals Addressed   None     Our next appointment is by telephone on 04/22/23 at 10:30am  Please call the care guide team at [phone] if you need to cancel or reschedule your appointment.   If you are experiencing a Mental Health or Behavioral Health Crisis or need someone to talk to, please call 911  Patient verbalizes understanding of instructions and care plan provided today and agrees to view in MyChart. Active MyChart status and patient understanding of how to access instructions and care plan via MyChart confirmed with patient.     Telephone follow up appointment with care management team member scheduled for: 04/22/23 at 10:30am.  Lysle Morales, BSW Social Worker [facility] Care Management  [phone]

## 2023-04-18 NOTE — Telephone Encounter (Signed)
Unable to refill per protocol, Rx request was refilled 04/17/23, duplicate request.  Requested Prescriptions  Pending Prescriptions Disp Refills   carvedilol (COREG) 6.25 MG tablet [Pharmacy Med Name: CARVEDILOL 6.25MG  TABLETS] 180 tablet     Sig: TAKE 1 TABLET(6.25 MG) BY MOUTH TWICE DAILY WITH A MEAL     Cardiovascular: Beta Blockers 3 Failed - 04/17/2023  2:25 PM      Failed - Last BP in normal range    BP Readings from Last 1 Encounters:  04/17/23 (!) 168/88         Passed - Cr in normal range and within 360 days    Creat  Date Value Ref Range Status  05/01/2022 0.97 0.60 - 1.00 mg/dL Final         Passed - AST in normal range and within 360 days    AST  Date Value Ref Range Status  05/01/2022 19 10 - 35 U/L Final   SGOT(AST)  Date Value Ref Range Status  07/17/2014 24 15 - 37 Unit/L Final         Passed - ALT in normal range and within 360 days    ALT  Date Value Ref Range Status  05/01/2022 13 6 - 29 U/L Final   SGPT (ALT)  Date Value Ref Range Status  07/17/2014 18 U/L Final    Comment:    14-63 NOTE: New Reference Range 05/25/14          Passed - Last Heart Rate in normal range    Pulse Readings from Last 1 Encounters:  04/17/23 63         Passed - Valid encounter within last 6 months    Recent Outpatient Visits           Yesterday Primary hypertension   [place] [facility] Smitty Cords, DO   2 weeks ago Primary hypertension   [place] [facility] Smitty Cords, DO   3 weeks ago Restless legs   [place] [facility] Komatke, Netta Neat, DO   3 weeks ago Primary hypertension   [place] [facility] Delles, Gentry Fitz A, RPH-CPP   1 month ago Primary hypertension   [place] [facility] Delles, Jackelyn Poling, RPH-CPP       Future Appointments             In 2 weeks Althea Charon, Netta Neat, DO [place] [facility], [facility]

## 2023-04-19 ENCOUNTER — Ambulatory Visit: Payer: 59 | Admitting: Pharmacist

## 2023-04-19 DIAGNOSIS — I10 Essential (primary) hypertension: Secondary | ICD-10-CM

## 2023-04-19 NOTE — Patient Instructions (Signed)
Check your blood pressure once daily, and any time you have concerning symptoms like headache, chest pain, dizziness, shortness of breath, or vision changes.   Our goal is less than 130/80.  To appropriately check your blood pressure, make sure you do the following:  1) Avoid caffeine, exercise, or tobacco products for 30 minutes before checking. Empty your bladder. 2) Sit with your back supported in a flat-backed chair. Rest your arm on something flat (arm of the chair, table, etc). 3) Sit still with your feet flat on the floor, resting, for at least 5 minutes.  4) Check your blood pressure. Take 1-2 readings.  5) Write down these readings and bring with you to any provider appointments.  Bring your home blood pressure machine with you to a provider's office for accuracy comparison at least once a year.   Make sure you take your blood pressure medications before you come to any office visit, even if you were asked to fast for labs.  Sakinah Rosamond Jacaden Forbush, PharmD, BCACP, CPP Clinical Pharmacist [facility] [phone]  

## 2023-04-19 NOTE — Progress Notes (Signed)
Outreach Note  04/19/2023 Name: Kirsten [name] MRN: [id number] DOB: 05/03/1946  I connected with [name] on 04/19/23 by telephone outreach and verified that I am speaking with the correct person using two identifiers.  Patient appearing on report for [place] Metric Hypertension Control due to last documented ambulatory blood pressure of 168/88 on 04/17/2023. Next appointment with PCP is 05/03/2023 (lab work 6/18)  Note at Office Visit with PCP on 6/12, provider advised patient to remain off of Bystolic. Patient to start Carvedilol 6.25mg  twice a day with meal for blood pressure   Outreached patient to discuss hypertension control and medication management.   Outpatient Encounter Medications as of 04/19/2023  Medication Sig Note   albuterol (PROVENTIL HFA) 108 (90 Base) MCG/ACT inhaler Inhale 2 puffs into the lungs every 6 (six) hours as needed for wheezing or shortness of breath.    aspirin EC 81 MG tablet Take 1 tablet (81 mg total) by mouth daily.    carvedilol (COREG) 6.25 MG tablet Take 1 tablet (6.25 mg total) by mouth 2 (two) times daily with a meal.    gabapentin (NEURONTIN) 300 MG capsule Take 1 capsule (300 mg total) by mouth at bedtime. 04/15/2023: Since being dizzy she has not taken   loratadine (CLARITIN) 10 MG tablet Take 1 tablet (10 mg total) by mouth daily.    omega-3 acid ethyl esters (LOVAZA) 1 g capsule Take 1 g by mouth daily.    Respir Rate Dev-Bld Press Mon (RESPERATE 1.0) KIT Use daily to help lower blood pressure    tiZANidine (ZANAFLEX) 4 MG tablet Take 1 tablet (4 mg total) by mouth every 8 (eight) hours as needed for muscle spasms.    traZODone (DESYREL) 50 MG tablet Take 1 tablet (50 mg total) by mouth at bedtime as needed for sleep.    No facility-administered encounter medications on file as of 04/19/2023.    Lab Results  Component Value Date   CREATININE 0.97 05/01/2022   BUN 17 05/01/2022   NA 143 05/01/2022   K 4.6 05/01/2022   CL 109  05/01/2022   CO2 26 05/01/2022    BP Readings from Last 3 Encounters:  04/17/23 (!) 168/88  04/02/23 (!) 154/90  03/25/23 (!) 166/86    Pulse Readings from Last 3 Encounters:  04/17/23 63  04/02/23 (!) 57  03/25/23 68    Current medications: Carvedilol 6.25mg  twice a day with meal  Reports started 2 days ago  Reports tolerating well; denies side effects   Previous therapies tried: benazepril (cough), HCTZ, losartan, valsartan (throbbing headache), amlodipine, Bystolic (dizziness)   Home Monitoring: Reports obtained an upper arm blood pressure monitor Have counseled on BP monitoring technique   Denies checking home BP since started carvedilol   Reports has significantly reduced her salt/sodium intake   Reports has reduced her caffeine intake (previously reported drinking only coca cola throughout the day). Reports now drinking water and a peach drink instead   Current Physical Activity: limited to movement around home     Assessment/Plan: - Currently uncontrolled - Reviewed appropriate administration of medication regimen - Have counseled on long term microvascular and macrovascular complications of uncontrolled hypertension - Reviewed appropriate home BP monitoring technique (avoid caffeine, smoking, and exercise for 30 minutes before checking, rest for at least 5 minutes before taking BP, sit with feet flat on the floor and back against a hard surface, uncross legs, and rest arm on flat surface) - Will mail patient blood pressure  log and handout on monitoring technique as requested - Reviewed to check blood pressure daily, document, and provide at next provider visit. Patient to contact office sooner if needed for readings outside of established parameters or symptoms - Encourage patient to continue positive dietary changes that she has made, including Reducing salt intake Reviewing nutrition labels for sodium content of foods Reducing consumption of soda. Drinking water  in place of soda - Have reviewed strategies to improve medication adherence. Encouraged patient to obtain and start using weekly pillbox to aid with adherence     Follow Up Plan: CM Pharmacist will outreach to patient by telephone again on 04/29/2023 at 1:45 pm   Estelle Grumbles, PharmD, [facility] Clinical Pharmacist [facility] [phone]

## 2023-04-22 ENCOUNTER — Ambulatory Visit: Payer: Self-pay

## 2023-04-22 NOTE — Patient Instructions (Signed)
Visit Information  Thank you for taking time to visit with me today. Please don't hesitate to contact me if I can be of assistance to you.   Following are the goals we discussed today:   Goals Addressed             This Visit's Progress    Obtain personal care services       -Patient wants assistance with medication management, cooking, monitoring blood pressure, managing appointments -Patient will speak to provider on 04/17/23 to request order  04/22/23  -Patient will speak to provider on 04/22/23 to request order        Our next appointment is by telephone on 04/24/23 at 1pm  Please call the care guide team at [phone] if you need to cancel or reschedule your appointment.   If you are experiencing a Mental Health or Behavioral Health Crisis or need someone to talk to, please call 911  Patient verbalizes understanding of instructions and care plan provided today and agrees to view in MyChart. Active MyChart status and patient understanding of how to access instructions and care plan via MyChart confirmed with patient.     Telephone follow up appointment with care management team member scheduled for: 04/24/23 at 1pm  Lysle Morales, BSW Social Worker [facility] Care Management  [phone]

## 2023-04-22 NOTE — Patient Outreach (Signed)
  Care Coordination   Follow Up Visit Note   04/22/2023 Name: Kirsten [name] MRN: [id number] DOB: 07-Jan-1946  Kirsten [name] is a 77 y.o. year old female who sees Smitty Cords, DO for primary care. I spoke with  [name] by phone today.  What matters to the patients health and wellness today?  Patients last visit with the provider was rescheduled to 04/23/23.  Patient will speak to provider to request order for personal care.    Goals Addressed             This Visit's Progress    Obtain personal care services       -Patient wants assistance with medication management, cooking, monitoring blood pressure, managing appointments -Patient will speak to provider on 04/17/23 to request order  04/22/23  -Patient will speak to provider on 04/22/23 to request order        SDOH assessments and interventions completed:  Yes    Interventions Today    Flowsheet Row Most Recent Value  General Interventions   General Interventions Discussed/Reviewed General Interventions Reviewed  [Plans to speak to md about PC order at next visit 04/23/23]        Care Coordination Interventions:  No, not indicated   Follow up plan: Follow up call scheduled for 04/24/23 at 1pm    Encounter Outcome:  Pt. Visit Completed

## 2023-04-23 ENCOUNTER — Other Ambulatory Visit: Payer: Medicare Other

## 2023-04-23 DIAGNOSIS — Z0000 Encounter for general adult medical examination without abnormal findings: Secondary | ICD-10-CM | POA: Diagnosis not present

## 2023-04-23 DIAGNOSIS — I10 Essential (primary) hypertension: Secondary | ICD-10-CM

## 2023-04-23 DIAGNOSIS — R7309 Other abnormal glucose: Secondary | ICD-10-CM | POA: Diagnosis not present

## 2023-04-23 DIAGNOSIS — E782 Mixed hyperlipidemia: Secondary | ICD-10-CM

## 2023-04-23 LAB — CBC WITH DIFFERENTIAL/PLATELET
Eosinophils Relative: 1.3 %
Neutro Abs: 5964 cells/uL (ref 1500–7800)
RBC: 4.43 10*6/uL (ref 3.80–5.10)
RDW: 14.1 % (ref 11.0–15.0)
Total Lymphocyte: 21.1 %

## 2023-04-24 ENCOUNTER — Ambulatory Visit: Payer: Self-pay

## 2023-04-24 LAB — CBC WITH DIFFERENTIAL/PLATELET
Absolute Monocytes: 538 cells/uL (ref 200–950)
Basophils Absolute: 17 cells/uL (ref 0–200)
Basophils Relative: 0.2 %
Eosinophils Absolute: 109 cells/uL (ref 15–500)
HCT: 37.6 % (ref 35.0–45.0)
Hemoglobin: 11.3 g/dL — ABNORMAL LOW (ref 11.7–15.5)
Lymphs Abs: 1772 cells/uL (ref 850–3900)
MCH: 25.5 pg — ABNORMAL LOW (ref 27.0–33.0)
MCHC: 30.1 g/dL — ABNORMAL LOW (ref 32.0–36.0)
MCV: 84.9 fL (ref 80.0–100.0)
MPV: 11.3 fL (ref 7.5–12.5)
Monocytes Relative: 6.4 %
Neutrophils Relative %: 71 %
Platelets: 309 10*3/uL (ref 140–400)
WBC: 8.4 10*3/uL (ref 3.8–10.8)

## 2023-04-24 LAB — COMPLETE METABOLIC PANEL WITH GFR
AG Ratio: 1.8 (calc) (ref 1.0–2.5)
ALT: 10 U/L (ref 6–29)
AST: 18 U/L (ref 10–35)
Albumin: 3.7 g/dL (ref 3.6–5.1)
Alkaline phosphatase (APISO): 63 U/L (ref 37–153)
BUN/Creatinine Ratio: 6 (calc) (ref 6–22)
BUN: 6 mg/dL — ABNORMAL LOW (ref 7–25)
CO2: 27 mmol/L (ref 20–32)
Calcium: 8.7 mg/dL (ref 8.6–10.4)
Chloride: 107 mmol/L (ref 98–110)
Creat: 0.97 mg/dL (ref 0.60–1.00)
Globulin: 2.1 g/dL (calc) (ref 1.9–3.7)
Glucose, Bld: 75 mg/dL (ref 65–99)
Potassium: 3.9 mmol/L (ref 3.5–5.3)
Sodium: 143 mmol/L (ref 135–146)
Total Bilirubin: 0.3 mg/dL (ref 0.2–1.2)
Total Protein: 5.8 g/dL — ABNORMAL LOW (ref 6.1–8.1)
eGFR: 61 mL/min/{1.73_m2} (ref >=60)

## 2023-04-24 LAB — TSH: TSH: 1.38 mIU/L (ref 0.40–4.50)

## 2023-04-24 LAB — HEMOGLOBIN A1C
Hgb A1c MFr Bld: 5.6 % of total Hgb (ref <5.7)
Mean Plasma Glucose: 114 mg/dL
eAG (mmol/L): 6.3 mmol/L

## 2023-04-24 LAB — LIPID PANEL
Cholesterol: 144 mg/dL (ref <200)
HDL: 42 mg/dL — ABNORMAL LOW (ref >=50)
LDL Cholesterol (Calc): 88 mg/dL (calc)
Non-HDL Cholesterol (Calc): 102 mg/dL (calc) (ref <130)
Total CHOL/HDL Ratio: 3.4 (calc) (ref <5.0)
Triglycerides: 58 mg/dL (ref <150)

## 2023-04-24 NOTE — Patient Instructions (Signed)
Visit Information  Thank you for taking time to visit with me today. Please don't hesitate to contact me if I can be of assistance to you.   Following are the goals we discussed today:   Goals Addressed             This Visit's Progress    Obtain personal care services       -Patient wants assistance with medication management, cooking, monitoring blood pressure, managing appointments -Patient will speak to provider on 04/17/23 to request order  04/22/23  -Patient will speak to provider on 04/22/23 to request order  04/24/23 -Patient will speak to provider on 05/03/23 to request order -Patient will speak to pharmacy team on 04/29/23 to manage medications -Patient wants to speak to nurse for support with blood pressure monitor         Our next appointment is by telephone on 05/06/23 at 1pm  Please call the care guide team at [phone] if you need to cancel or reschedule your appointment.   If you are experiencing a Mental Health or Behavioral Health Crisis or need someone to talk to, please call 911  Patient verbalizes understanding of instructions and care plan provided today and agrees to view in MyChart. Active MyChart status and patient understanding of how to access instructions and care plan via MyChart confirmed with patient.     Telephone follow up appointment with care management team member scheduled for:05/06/23 at 1pm.  Lysle Morales, BSW Social Worker [facility] Care Management  [phone]

## 2023-04-24 NOTE — Patient Outreach (Signed)
  Care Coordination   Follow Up Visit Note   04/24/2023 Name: Kirsten [name] MRN: [id number] DOB: 1946/01/01  Kirsten [name] is a 77 y.o. year old female who sees Smitty Cords, DO for primary care. I spoke with  [name] by phone today.  What matters to the patients health and wellness today?  Patients last visit was only for blood work and was not able to communicate with the provider to request PCS order.  Patient will see the provider on 6/28 and will address the PCS Order.  Patients is struggling with the proper use of the blood pressure monitor and agreed to work with the RN.    Goals Addressed             This Visit's Progress    Obtain personal care services       -Patient wants assistance with medication management, cooking, monitoring blood pressure, managing appointments -Patient will speak to provider on 04/17/23 to request order  04/22/23  -Patient will speak to provider on 04/22/23 to request order  04/24/23 -Patient will speak to provider on 05/03/23 to request order -Patient will speak to pharmacy team on 04/29/23 to manage medications -Patient wants to speak to nurse for support with blood pressure monitor         SDOH assessments and interventions completed:  Yes    Interventions Today    Flowsheet Row Most Recent Value  General Interventions   General Interventions Discussed/Reviewed General Interventions Reviewed, Referral to Nurse  [Patient was not able to speak to the md for [facility] service order,  will speak to md on 6/28.  Request RN assistance with using blood pressure monitor.]  Pharmacy Interventions   Pharmacy Dicussed/Reviewed Pharmacy Topics Discussed  [Patient has pharmacy support scheduled 6/24 and is interested in blister packs for medication management]        Care Coordination Interventions:  Yes, provided  Care Coordination Interventions: SW contacted CCM team to assist with education on blood pressure monitor   Follow up  plan: Follow up call scheduled for 05/06/23 at 1pm    Encounter Outcome:  Pt. Visit Completed

## 2023-04-29 ENCOUNTER — Ambulatory Visit: Payer: 59 | Admitting: Pharmacist

## 2023-04-29 DIAGNOSIS — I10 Essential (primary) hypertension: Secondary | ICD-10-CM

## 2023-04-29 NOTE — Progress Notes (Signed)
Outreach Note  04/29/2023 Name: Kirsten [name] MRN: [id number] DOB: 12/20/1945  I connected with [name] on 04/29/23 by telephone outreach and verified that I am speaking with the correct person using two identifiers.  Patient appearing on report for [place] Met[employer] Hypertension Control due to last documented ambulatory blood pressure of 168/88 on 04/17/2023. Next appointment with PCP is 05/03/2023 (lab work 6/18)   Note at Office Visit with PCP on 6/12, provider advised patient to remain off of Bystolic. Patient to start Carvedilol 6.25mg  twice a day with meal for blood pressure   Outreached patient to discuss hypertension control and medication management.   Outpatient Encounter Medications as of 04/29/2023  Medication Sig Note   albuterol (PROVENTIL HFA) 108 (90 Base) MCG/ACT inhaler Inhale 2 puffs into the lungs every 6 (six) hours as needed for wheezing or shortness of breath.    aspirin EC 81 MG tablet Take 1 tablet (81 mg total) by mouth daily.    carvedilol (COREG) 6.25 MG tablet Take 1 tablet (6.25 mg total) by mouth 2 (two) times daily with a meal.    gabapentin (NEURONTIN) 300 MG capsule Take 1 capsule (300 mg total) by mouth at bedtime. 04/15/2023: Since being dizzy she has not taken   loratadine (CLARITIN) 10 MG tablet Take 1 tablet (10 mg total) by mouth daily.    omega-3 acid ethyl esters (LOVAZA) 1 g capsule Take 1 g by mouth daily.    Respir Rate Dev-Bld Press Mon (RESPERATE 1.0) KIT Use daily to help lower blood pressure    tiZANidine (ZANAFLEX) 4 MG tablet Take 1 tablet (4 mg total) by mouth every 8 (eight) hours as needed for muscle spasms.    traZODone (DESYREL) 50 MG tablet Take 1 tablet (50 mg total) by mouth at bedtime as needed for sleep.    No facility-administered encounter medications on file as of 04/29/2023.    Lab Results  Component Value Date   CREATININE 0.97 04/23/2023   BUN 6 (L) 04/23/2023   NA 143 04/23/2023   K 3.9 04/23/2023   CL 107  04/23/2023   CO2 27 04/23/2023    BP Readings from Last 3 Encounters:  04/17/23 (!) 168/88  04/02/23 (!) 154/90  03/25/23 (!) 166/86    Pulse Readings from Last 3 Encounters:  04/17/23 63  04/02/23 (!) 57  03/25/23 68    Current medications: Carvedilol 6.25mg  twice a day with meal             Reports tolerating well; denies side effects   Previous therapies tried: benazepril (cough), HCTZ, losartan, valsartan (throbbing headache), amlodipine, Bystolic (dizziness)   Home Monitoring: Reports obtained an upper arm blood pressure monitor Have counseled on BP monitoring technique   Reports recent home BP readings: - Today: 197/114, HR 75 - Yesterday: 195/98, HR 72   Reports has been sick with sinus congestion and discomfort and sore throat for >1 week now  Denies other symptoms/symptoms of hypertensive emergency, such as chest pain, shortness of breath, back pain, numbness/weakness, change in vision or difficulty speaking  Denies adding salt to her food, but reports has been eating fried chicken  Reports has reduced her caffeine intake (previously reported drinking only coca cola throughout the day). Reports now drinking water and a peach drink instead   Current Physical Activity: limited to movement around home     Assessment/Plan: - Currently uncontrolled - Reviewed appropriate administration of medication regimen - Have counseled on long term  microvascular and macrovascular complications of uncontrolled hypertension - Reviewed appropriate home BP monitoring technique (avoid caffeine, smoking, and exercise for 30 minutes before checking, rest for at least 5 minutes before taking BP, sit with feet flat on the floor and back against a hard surface, uncross legs, and rest arm on flat surface) - Counsel if blood pressure reading is 180/120 or greater and has any other associated symptoms of target organ damage such as chest pain, shortness of breath, back pain,  numbness/weakness, change in vision or difficulty speaking, this would be considered a hypertensive emergency and he should call 911  - Reviewed to check blood pressure daily, document, and provide at next provider visit. Patient to contact office sooner if needed for readings outside of established parameters or symptoms - Encourage patient to continue positive dietary changes that she has made, including Reducing salt intake Reviewing nutrition labels for sodium content of foods Reducing consumption of soda. Drinking water in place of soda - Have reviewed strategies to improve medication adherence. Again encouraged patient to obtain and start using weekly pillbox to aid with adherence - Recommend patient contact office today to schedule a sooner appointment regarding her sinus congestion symptoms, but patient declines; prefers to wait until appointment with PCP on 6/28 to address - Will collaborate with NP Nicki Reaper, covering for PCP, regarding patient's reported home BP readings     Follow Up Plan: CM Pharmacist will outreach to patient by telephone again on ***   Estelle Grumbles, PharmD, [facility] Clinical Pharmacist [facility] [phone]

## 2023-04-30 NOTE — Patient Instructions (Signed)
Check your blood pressure once daily, and any time you have concerning symptoms like headache, chest pain, dizziness, shortness of breath, or vision changes.   Our goal is less than 130/80.  To appropriately check your blood pressure, make sure you do the following:  1) Avoid caffeine, exercise, or tobacco products for 30 minutes before checking. Empty your bladder. 2) Sit with your back supported in a flat-backed chair. Rest your arm on something flat (arm of the chair, table, etc). 3) Sit still with your feet flat on the floor, resting, for at least 5 minutes.  4) Check your blood pressure. Take 1-2 readings.  5) Write down these readings and bring with you to any provider appointments.  Bring your home blood pressure machine with you to a provider's office for accuracy comparison at least once a year.   Make sure you take your blood pressure medications before you come to any office visit, even if you were asked to fast for labs.  Anureet Bruington Sung [place], PharmD, BCACP, CPP Clinical Pharmacist [facility] [place] [phone]  

## 2023-05-03 ENCOUNTER — Encounter: Payer: 59 | Admitting: Family Medicine

## 2023-05-05 DIAGNOSIS — M1990 Unspecified osteoarthritis, unspecified site: Secondary | ICD-10-CM | POA: Diagnosis not present

## 2023-05-05 DIAGNOSIS — E785 Hyperlipidemia, unspecified: Secondary | ICD-10-CM | POA: Diagnosis not present

## 2023-05-05 DIAGNOSIS — I10 Essential (primary) hypertension: Secondary | ICD-10-CM | POA: Diagnosis not present

## 2023-05-06 ENCOUNTER — Ambulatory Visit: Payer: Self-pay

## 2023-05-06 NOTE — Patient Outreach (Signed)
  Care Coordination   Follow Up Visit Note   05/06/2023 Name: Kirsten [name] MRN: [id number] DOB: 1946/06/16  Kirsten [name] is a 77 y.o. year old female who sees Smitty Cords, DO for primary care. I spoke with  Kirsten [name] by phone today.  What matters to the patients health and wellness today?  Patient does not feel well and request to reschedule for the same time tomorrow.      Goals Addressed   None     SDOH assessments and interventions completed:  No     Care Coordination Interventions:  No, not indicated   Follow up plan: Follow up call scheduled for 05/07/23 at 1pm    Encounter Outcome:  Pt. Visit Completed

## 2023-05-07 ENCOUNTER — Ambulatory Visit: Payer: Self-pay

## 2023-05-07 NOTE — Patient Outreach (Signed)
  Care Coordination   Follow Up Visit Note   05/07/2023 Name: Kirsten [name] MRN: [id number] DOB: [birth year]  Kirsten [name] is a 77 y.o. year old female who sees Smitty Cords, DO for primary care. I spoke with  [name] by phone today.  What matters to the patients health and wellness today?  Patient is confused about appointment.  Patient needs to address request for PCS at next medical visit.  Patient wants rx in blister packs.    Goals Addressed             This Visit's Progress    Obtain personal care services       -Patient wants assistance with medication management, cooking, monitoring blood pressure, managing appointments -Patient will speak to provider on 04/17/23 to request order  04/22/23  -Patient will speak to provider on 04/22/23 to request order  04/24/23 -Patient will speak to provider on 05/03/23 to request order -Patient will speak to pharmacy team on 04/29/23 to manage medications -Patient wants to speak to nurse for support with blood pressure monitor   Care Coordination Interventions: Patient is confused about upcoming appointments Patient wants rx in blister packs Patient will communicate need to Estelle Grumbles in Pharmacy to arrange          SDOH assessments and interventions completed:  Yes  SDOH Interventions Today    Flowsheet Row Most Recent Value  SDOH Interventions   Food Insecurity Interventions Intervention Not Indicated  Housing Interventions Intervention Not Indicated  Utilities Interventions Intervention Not Indicated        Care Coordination Interventions:  Yes, provided  Interventions Today    Flowsheet Row Most Recent Value  General Interventions   General Interventions Discussed/Reviewed General Interventions Reviewed  [Patient is confused about appointments.  SW t/c [employer] [facility] and confirmed 7/12 apt for Wellness Check is in person and 9/4 is for the Annual Check up.]  Pharmacy Interventions    Pharmacy Dicussed/Reviewed Medication Adherence  [Patient wants rx set up in packs.  SW [employer] Pharmacist to assist with arranging service]        Follow up plan: Follow up call scheduled for 05/20/23 11am    Encounter Outcome:  Pt. Visit Completed

## 2023-05-07 NOTE — Patient Instructions (Signed)
Visit Information  Thank you for taking time to visit with me today. Please don't hesitate to contact me if I can be of assistance to you.   Following are the goals we discussed today:   Goals Addressed             This Visit's Progress    Obtain personal care services       -Patient wants assistance with medication management, cooking, monitoring blood pressure, managing appointments -Patient will speak to provider on 04/17/23 to request order  04/22/23  -Patient will speak to provider on 04/22/23 to request order  04/24/23 -Patient will speak to provider on 05/03/23 to request order -Patient will speak to pharmacy team on 04/29/23 to manage medications -Patient wants to speak to nurse for support with blood pressure monitor   Care Coordination Interventions: Patient is confused about upcoming appointments Patient wants rx in blister packs Patient will communicate need to Estelle Grumbles in Pharmacy to arrange          Our next appointment is by telephone on 05/20/23 at 11:00 am  Please call the care guide team at [phone] if you need to cancel or reschedule your appointment.   If you are experiencing a Mental Health or Behavioral Health Crisis or need someone to talk to, please call 911  Patient verbalizes understanding of instructions and care plan provided today and agrees to view in MyChart. Active MyChart status and patient understanding of how to access instructions and care plan via MyChart confirmed with patient.     Telephone follow up appointment with care management team member scheduled for: No further follow up required: 05/20/23 at 11:00am. Lysle Morales, BSW Social Worker [facility] Care Management  [phone]

## 2023-05-08 ENCOUNTER — Telehealth: Payer: Self-pay | Admitting: *Deleted

## 2023-05-08 NOTE — Telephone Encounter (Signed)
Erroneous encounter

## 2023-05-10 ENCOUNTER — Telehealth: Payer: Self-pay | Admitting: Pharmacist

## 2023-05-10 NOTE — Progress Notes (Unsigned)
   Outreach Note  05/08/2023 Name: Kirsten [name] MRN: [id number] DOB: 02/09/1946  Referred by: Smitty Cords, DO Reason for referral : No chief complaint on file.   Was unable to reach patient via telephone today and unable to leave a message x 2.  From review of chart, note patient canceled follow up appointment scheduled with PCP on 05/03/2023   Follow Up Plan:   1) Will collaborate with office to request outreach to patient to reschedule HTN follow up appointment with PCP  2) Will attempt to reach patient by telephone again within the next 30 days  Estelle Grumbles, PharmD, [facility] Clinical Pharmacist [facility] [phone]

## 2023-05-16 ENCOUNTER — Telehealth: Payer: 59

## 2023-05-16 ENCOUNTER — Ambulatory Visit (INDEPENDENT_AMBULATORY_CARE_PROVIDER_SITE_OTHER): Payer: 59

## 2023-05-16 DIAGNOSIS — E782 Mixed hyperlipidemia: Secondary | ICD-10-CM

## 2023-05-16 DIAGNOSIS — G894 Chronic pain syndrome: Secondary | ICD-10-CM

## 2023-05-16 DIAGNOSIS — I10 Essential (primary) hypertension: Secondary | ICD-10-CM

## 2023-05-16 NOTE — Patient Instructions (Addendum)
Please call the care guide team at [phone] if you need to cancel or reschedule your appointment.   If you are experiencing a Mental Health or Behavioral Health Crisis or need someone to talk to, please call the Suicide and Crisis Lifeline: 988 call the [place] National Suicide Prevention Lifeline: (973) 116-4289 or TTY: (209)169-6550 TTY [phone]) to talk to a trained counselor call 1-800-273-TALK (toll free, 24 hour hotline)   Following is a copy of the CCM Program Consent:  CCM service includes personalized support from designated clinical staff supervised by the physician, including individualized plan of care and coordination with other care providers 24/7 contact phone numbers for assistance for urgent and routine care needs. Service will only be billed when office clinical staff spend 20 minutes or more in a month to coordinate care. Only one practitioner may furnish and bill the service in a calendar month. The patient may stop CCM services at amy time (effective at the end of the month) by phone call to the office staff. The patient will be responsible for cost sharing (co-pay) or up to 20% of the service fee (after annual deductible is met)  Following is a copy of your full provider care plan:   Goals Addressed             This Visit's Progress    CCM Expected outcome:  Monitor, Self-Manage and Reduce Symptoms of HLD       Current Barriers:  Care Coordination needs related to the patients chronic conditions and how to effectively manage in a patient with HLD and other chronic conditions Chronic Disease Management support and education needs related to effective management of HLD Lacks caregiver support.  Lab Results  Component Value Date   CHOL 144 04/23/2023   HDL 42 (L) 04/23/2023   LDLCALC 88 04/23/2023   TRIG 58 04/23/2023   CHOLHDL 3.4 04/23/2023     Planned Interventions: Provider established cholesterol goals reviewed. Review with the patient. The patient is  at goal; however her blood pressures are out of control. Education and support given. She will see the AWV nurse tomorrow in the office. Instructions given to the patient.; Counseled on importance of regular laboratory monitoring as prescribed. Has regular labs done. The patients labs are up to date. ; Provided HLD educational materials; Reviewed role and benefits of statin for ASCVD risk reduction; Discussed strategies to manage statin-induced myalgias; Reviewed importance of limiting foods high in cholesterol. Review and education provided. ; Reviewed exercise goals and target of 150 minutes per week; Screening for signs and symptoms of depression related to chronic disease state;  Assessed social determinant of health barriers;   Symptom Management: Take medications as prescribed   Attend all scheduled provider appointments Call provider office for new concerns or questions  call the Suicide and Crisis Lifeline: 988 call the [place] National Suicide Prevention Lifeline: 807-654-5930 or TTY: 563-030-4753 TTY [phone]) to talk to a trained counselor call 1-800-273-TALK (toll free, 24 hour hotline) if experiencing a Mental Health or Behavioral Health Crisis  - take all medications exactly as prescribed - call doctor with any symptoms you believe are related to your medicine - call doctor when you experience any new symptoms - go to all doctor appointments as scheduled - adhere to prescribed diet: heart healthy diet   Follow Up Plan: Telephone follow up appointment with care management team member scheduled for: 06-27-2023 at 0900 am       CCM Expected Outcome:  Monitor, Self-Manage and Reduce Symptoms of:  Arthritis       Current Barriers:  Knowledge Deficits related to how to effectively manage pain and discomfort Chronic Disease Management support and education needs related to chronic pain and discomfort  Planned Interventions: Reviewed provider established plan for pain  management. Denies any acute pain concerns at this time. Is safe and denies any falls. Education given.; Discussed importance of adherence to all scheduled medical appointments. Reminder given of AWV for 05-17-2023 at the office; Counseled on the importance of reporting any/all new or changed pain symptoms or management strategies to pain management provider; Advised patient to report to care team affect of pain on daily activities; Discussed use of relaxation techniques and/or diversional activities to assist with pain reduction (distraction, imagery, relaxation, massage, acupressure, TENS, heat, and cold application; Reviewed with patient prescribed pharmacological and nonpharmacological pain relief strategies; Advised patient to discuss unresolved pain, changes in level or intensity of pain with provider; Screening for signs and symptoms of depression related to chronic disease state;  Assessed social determinant of health barriers;   Symptom Management: Take medications as prescribed   Attend all scheduled provider appointments Call provider office for new concerns or questions  call the Suicide and Crisis Lifeline: 988 call the [place] National Suicide Prevention Lifeline: 548-666-0685 or TTY: 351-693-0355 TTY 7254387636) to talk to a trained counselor call 1-800-273-TALK (toll free, 24 hour hotline) if experiencing a Mental Health or Behavioral Health Crisis   Follow Up Plan: Telephone follow up appointment with care management team member scheduled for: 06-27-2023 at 0900 am       CCM Expected Outcome:  Monitor, Self-Manage, and Reduce Symptoms of Hypertension       Current Barriers:  Knowledge Deficits related to medications and side effects of medications and the importance of reporting new sx and sx to the provider when they occur Care Coordination needs related to resources to help the patient in her home and in managing her health and well being in a patient with HTN and other  chronic conditions Chronic Disease Management support and education needs related to effective management of HTN Lacks caregiver support.  Cognitive Deficits  BP Readings from Last 3 Encounters:  04/17/23 (!) 168/88  04/02/23 (!) 154/90  03/25/23 (!) 166/86    Planned Interventions: Evaluation of current treatment plan related to hypertension self management and patient's adherence to plan as established by provider. The patient is not taking her carvedilol any longer as she states that it was causing her right temple to hurt when she took it.  Have collaborated with the pcp via secure chat. Have also ask the patient to bring her readings and her cuff with her to the office on 05-17-2023 when she is scheduled to have her AWV. Will collaborate with the nurse and ask for assistance with making sure she is taking her blood pressures correctly. Concern is that she is not taking her medications and stops without letting the pcp or nurse know. Extensive education given today on the importance of taking her blood pressure medication and keeping a record of her readings at home. Have alerted the team;   Provided education to patient re: stroke prevention, s/s of heart attack and stroke; Reviewed prescribed diet Heart healthy diet. The patient states she is eating healthy. She does not eat a lot due to GI issues but denies any acute changes in her dietary habits.  Reviewed medications with patient and discussed importance of compliance. Is not compliant with medications. Has stopped taking her carvedilol. Pcp  notified. The patient has a long standing history of non-compliance especially with HTN medications. Collaboration with the pcp and pharm D;  Discussed plans with patient for ongoing care management follow up and provided patient with direct contact information for care management team; Advised patient, providing education and rationale, to monitor blood pressure daily and record, calling PCP for findings  outside established parameters. The patient could not find her list but she did take her blood pressures while on the phone with the RNCM. Left arm: 180/112 and pulse 69; right arm 172/106, pulse 68. Review of the goal of blood pressures systolic <150 and diastolic <90. Education on the importance of taking medications and checking blood pressures on a regular basis. ;  Reviewed scheduled/upcoming provider appointments including: 07-10-2023 Advised patient to discuss changes in her blood pressure, new onset of dizziness, questions or concerns with provider; Provided education on prescribed diet heart healthy;  Discussed complications of poorly controlled blood pressure such as heart disease, stroke, circulatory complications, vision complications, kidney impairment, sexual dysfunction;  Screening for signs and symptoms of depression related to chronic disease state;  Assessed social determinant of health barriers;  The patient is asking for help in the home. She feels she needs a Estate agent". Explained she may not qualify for additional help in the home. Will collaborate with the pcp on patients expressed needs. Will also alert SW for additional support.   Symptom Management: Take medications as prescribed   Attend all scheduled provider appointments Call provider office for new concerns or questions  call the Suicide and Crisis Lifeline: 988 call the [place] National Suicide Prevention Lifeline: 423-610-2943 or TTY: 417-660-8050 TTY [phone]) to talk to a trained counselor call 1-800-273-TALK (toll free, 24 hour hotline) if experiencing a Mental Health or Behavioral Health Crisis  check blood pressure daily write blood pressure results in a log or diary learn about high blood pressure take blood pressure log to all doctor appointments call doctor for signs and symptoms of high blood pressure develop an action plan for high blood pressure keep all doctor appointments take medications for blood  pressure exactly as prescribed report new symptoms to your doctor  Follow Up Plan: Telephone follow up appointment with care management team member scheduled for: 06-27-2023 at 0900 am          The patient verbalized understanding of instructions, educational materials, and care plan provided today and agreed to receive a mailed copy of patient instructions, educational materials, and care plan.  Telephone follow up appointment with care management team member scheduled for: 06-27-2023 at 0900 am  How to Take Your Blood Pressure Blood pressure measures how strongly your blood is pressing against the walls of your arteries. Arteries are blood vessels that carry blood from your heart throughout your body. You can take your blood pressure at home with a machine. You may need to check your blood pressure at home: To check if you have high blood pressure (hypertension). To check your blood pressure over time. To make sure your blood pressure medicine is working. Supplies needed: Blood pressure machine, or monitor. A chair to sit in. This should be a chair where you can sit upright with your back supported. Do not sit on a soft couch or an armchair. Table or desk. Small notebook. Pencil or pen. How to prepare Avoid these things for 30 minutes before checking your blood pressure: Having drinks with caffeine in them, such as coffee or tea. Drinking alcohol. Eating. Smoking. Exercising. Do these things  five minutes before checking your blood pressure: Go to the bathroom and pee (urinate). Sit in a chair. Be quiet. Do not talk. How to take your blood pressure Follow the instructions that came with your machine. If you have a digital blood pressure monitor, these may be the instructions: Sit up straight. Place your feet on the floor. Do not cross your ankles or legs. Rest your left arm at the level of your heart. You may rest it on a table, desk, or chair. Pull up your shirt  sleeve. Wrap the blood pressure cuff around the upper part of your left arm. The cuff should be 1 inch (2.5 cm) above your elbow. It is best to wrap the cuff around bare skin. Fit the cuff snugly around your arm, but not too tightly. You should be able to place only one finger between the cuff and your arm. Place the cord so that it rests in the bend of your elbow. Press the power button. Sit quietly while the cuff fills with air and loses air. Write down the numbers on the screen. Wait 2-3 minutes and then repeat steps 1-10. What do the numbers mean? Two numbers make up your blood pressure. The first number is called systolic pressure. The second is called diastolic pressure. An example of a blood pressure reading is "120 over 80" (or 120/80). If you are an adult and do not have a medical condition, use this guide to find out if your blood pressure is normal: Normal First number: below 120. Second number: below 80. Elevated First number: 120-129. Second number: below 80. Hypertension stage 1 First number: 130-139. Second number: 80-89. Hypertension stage 2 First number: 140 or above. Second number: 90 or above. Your blood pressure is above normal even if only the first or only the second number is above normal. Follow these instructions at home: Medicines Take over-the-counter and prescription medicines only as told by your doctor. Tell your doctor if your medicine is causing side effects. General instructions Check your blood pressure as often as your doctor tells you to. Check your blood pressure at the same time every day. Take your monitor to your next doctor's appointment. Your doctor will: Make sure you are using it correctly. Make sure it is working right. Understand what your blood pressure numbers should be. Keep all follow-up visits. General tips You will need a blood pressure machine or monitor. Your doctor can suggest a monitor. You can buy one at a drugstore or  online. When choosing one: Choose one with an arm cuff. Choose one that wraps around your upper arm. Only one finger should fit between your arm and the cuff. Do not choose one that measures your blood pressure from your wrist or finger. Where to find more information American Heart Association: www.heart.org Contact a doctor if: Your blood pressure keeps being high. Your blood pressure is suddenly low. Get help right away if: Your first blood pressure number is higher than 180. Your second blood pressure number is higher than 120. These symptoms may be an emergency. Do not wait to see if the symptoms will go away. Get help right away. Call 911. Summary Check your blood pressure at the same time every day. Avoid caffeine, alcohol, smoking, and exercise for 30 minutes before checking your blood pressure. Make sure you understand what your blood pressure numbers should be. This information is not intended to replace advice given to you by your health care provider. Make sure you discuss any questions  you have with your health care provider. Document Revised: 07/06/2021 Document Reviewed: 07/06/2021 Elsevier Patient Education  2024 Else[employer] Inc. Hypertension, Adult Hypertension is another name for high blood pressure. High blood pressure forces your heart to work harder to pump blood. This can cause problems over time. There are two numbers in a blood pressure reading. There is a top number (systolic) over a bottom number (diastolic). It is best to have a blood pressure that is below 120/80. What are the causes? The cause of this condition is not known. Some other conditions can lead to high blood pressure. What increases the risk? Some lifestyle factors can make you more likely to develop high blood pressure: Smoking. Not getting enough exercise or physical activity. Being overweight. Having too much fat, sugar, calories, or salt (sodium) in your diet. Drinking too much alcohol. Other  risk factors include: Having any of these conditions: Heart disease. Diabetes. High cholesterol. Kidney disease. Obstructive sleep apnea. Having a family history of high blood pressure and high cholesterol. Age. The risk increases with age. Stress. What are the signs or symptoms? High blood pressure may not cause symptoms. Very high blood pressure (hypertensive crisis) may cause: Headache. Fast or uneven heartbeats (palpitations). Shortness of breath. Nosebleed. Vomiting or feeling like you may vomit (nauseous). Changes in how you see. Very bad chest pain. Feeling dizzy. Seizures. How is this treated? This condition is treated by making healthy lifestyle changes, such as: Eating healthy foods. Exercising more. Drinking less alcohol. Your doctor may prescribe medicine if lifestyle changes do not help enough and if: Your top number is above 130. Your bottom number is above 80. Your personal target blood pressure may vary. Follow these instructions at home: Eating and drinking  If told, follow the DASH eating plan. To follow this plan: Fill one half of your plate at each meal with fruits and vegetables. Fill one fourth of your plate at each meal with whole grains. Whole grains include whole-wheat pasta, brown rice, and whole-grain bread. Eat or drink low-fat dairy products, such as skim milk or low-fat yogurt. Fill one fourth of your plate at each meal with low-fat (lean) proteins. Low-fat proteins include fish, chicken without skin, eggs, beans, and tofu. Avoid fatty meat, cured and processed meat, or chicken with skin. Avoid pre-made or processed food. Limit the amount of salt in your diet to less than 1,500 mg each day. Do not drink alcohol if: Your doctor tells you not to drink. You are pregnant, may be pregnant, or are planning to become pregnant. If you drink alcohol: Limit how much you have to: 0-1 drink a day for women. 0-2 drinks a day for men. Know how much  alcohol is in your drink. In the [place] one drink equals one 12 oz bottle of beer (355 mL), one 5 oz glass of wine (148 mL), or one 1 oz glass of hard liquor (44 mL). Lifestyle  Work with your doctor to stay at a healthy weight or to lose weight. Ask your doctor what the best weight is for you. Get at least 30 minutes of exercise that causes your heart to beat faster (aerobic exercise) most days of the week. This may include walking, swimming, or biking. Get at least 30 minutes of exercise that strengthens your muscles (resistance exercise) at least 3 days a week. This may include lifting weights or doing Pilates. Do not smoke or use any products that contain nicotine or tobacco. If you need help quitting, ask your doctor.  Check your blood pressure at home as told by your doctor. Keep all follow-up visits. Medicines Take over-the-counter and prescription medicines only as told by your doctor. Follow directions carefully. Do not skip doses of blood pressure medicine. The medicine does not work as well if you skip doses. Skipping doses also puts you at risk for problems. Ask your doctor about side effects or reactions to medicines that you should watch for. Contact a doctor if: You think you are having a reaction to the medicine you are taking. You have headaches that keep coming back. You feel dizzy. You have swelling in your ankles. You have trouble with your vision. Get help right away if: You get a very bad headache. You start to feel mixed up (confused). You feel weak or numb. You feel faint. You have very bad pain in your: Chest. Belly (abdomen). You vomit more than once. You have trouble breathing. These symptoms may be an emergency. Get help right away. Call 911. Do not wait to see if the symptoms will go away. Do not drive yourself to the hospital. Summary Hypertension is another name for high blood pressure. High blood pressure forces your heart to work harder to pump  blood. For most people, a normal blood pressure is less than 120/80. Making healthy choices can help lower blood pressure. If your blood pressure does not get lower with healthy choices, you may need to take medicine. This information is not intended to replace advice given to you by your health care provider. Make sure you discuss any questions you have with your health care provider. Document Revised: 08/10/2021 Document Reviewed: 08/10/2021 Elsevier Patient Education  2024 Elsevier Inc. Blood Pressure Record Sheet To take your blood pressure, you will need a blood pressure machine. You may be prescribed one, or you can buy a blood pressure machine (blood pressure monitor) at your clinic, drug store, or online. When choosing one, look for these features: An automatic monitor that has an arm cuff. A cuff that wraps snugly, but not too tightly, around your upper arm. You should be able to fit only one finger between your arm and the cuff. A device that stores blood pressure reading results. Do not choose a monitor that measures your blood pressure from your wrist or finger. Follow your health care provider's instructions for how to take your blood pressure. To use this form: Get one reading in the morning (a.m.) before you take any medicines. Get one reading in the evening (p.m.) before supper. Take at least two readings with each blood pressure check. This makes sure the results are correct. Wait 1-2 minutes between measurements. Write down the results in the spaces on this form. Repeat this once a week, or as told by your health care provider. Make a follow-up appointment with your health care provider to discuss the results. Blood pressure log Date: _______________________ a.m. _____________________(1st reading) _____________________(2nd reading) p.m. _____________________(1st reading) _____________________(2nd reading) Date: _______________________ a.m. _____________________(1st reading)  _____________________(2nd reading) p.m. _____________________(1st reading) _____________________(2nd reading) Date: _______________________ a.m. _____________________(1st reading) _____________________(2nd reading) p.m. _____________________(1st reading) _____________________(2nd reading) Date: _______________________ a.m. _____________________(1st reading) _____________________(2nd reading) p.m. _____________________(1st reading) _____________________(2nd reading) Date: _______________________ a.m. _____________________(1st reading) _____________________(2nd reading) p.m. _____________________(1st reading) _____________________(2nd reading) This information is not intended to replace advice given to you by your health care provider. Make sure you discuss any questions you have with your health care provider. Document Revised: 07/06/2021 Document Reviewed: 07/06/2021 Elsevier Patient Education  2024 Elsevier Inc. Carvedilol Tablets What is this medication?  CARVEDILOL (KAR ve dil ol) treats high blood pressure and heart failure. It may also be used to prevent further damage after a heart attack. It works by lowering your blood pressure and heart rate, making it easier for your heart to pump blood to the rest of your body. It belongs to a group of medications called beta blockers. This medicine may be used for other purposes; ask your health care provider or pharmacist if you have questions. COMMON BRAND NAME(S): Coreg What should I tell my care team before I take this medication? They need to know if you have any of these conditions: Circulation problems Diabetes History of heart attack or heart disease Liver disease Lung or breathing disease, such as asthma Pheochromocytoma Slow or irregular heartbeat Thyroid disease An unusual or allergic reaction to carvedilol, other medications, foods, dyes, or preservatives Pregnant or trying to get pregnant Breastfeeding How should I use this  medication? Take this medication by mouth. Take it as directed on the prescription label at the same time every day. Take it with food. Keep taking it unless your care team tells you to stop. Talk to your care team about the use of this medication in children. Special care may be needed. Overdosage: If you think you have taken too much of this medicine contact a poison control center or emergency room at once. NOTE: This medicine is only for you. Do not share this medicine with others. What if I miss a dose? If you miss a dose, take it as soon as you can. If it is almost time for your next dose, take only that dose. Do not take double or extra doses. What may interact with this medication? This medication may interact with the following: Certain medications for blood pressure, heart disease, irregular heartbeat Certain medications for depression, such as fluoxetine or paroxetine Certain medications for diabetes, such as glipizide or glyburide Cimetidine Clonidine Cyclosporine Digoxin MAOIs, such as Carbex, Eldepryl, Marplan, Nardil, and Parnate Reserpine Rifampin This list may not describe all possible interactions. Give your health care provider a list of all the medicines, herbs, non-prescription drugs, or dietary supplements you use. Also tell them if you smoke, drink alcohol, or use illegal drugs. Some items may interact with your medicine. What should I watch for while using this medication? Visit your care team for regular checks on your progress. Check your blood pressure as directed. Know what your blood pressure should be and when to contact your care team. This medication may affect your coordination, reaction time, or judgment. Do not drive or operate machinery until you know how this medication affects you. Sit up or stand slowly to reduce the risk of dizzy or fainting spells. Drinking alcohol with this medication can increase the risk of these side effects. Do not suddenly stop  taking this medication. This may increase your risk of side effects, such as chest pain and heart attack. If you no longer need to take this medication, your care team will lower the dose slowly over time to decrease the risk of side effects. If you are going to need surgery or a procedure, tell your care team that you are using this medication. This medication may affect blood glucose levels. It can also mask the symptoms of low blood sugar, such as a rapid heartbeat and tremors. If you have diabetes, it is important to check your blood sugar often while you are taking this medication. Do not treat yourself for coughs, colds, or pain while you are  using this medication without asking your care team for advice. Some medications may increase your blood pressure. What side effects may I notice from receiving this medication? Side effects that you should report to your care team as soon as possible: Allergic reactions--skin rash, itching, hives, swelling of the face, lips, tongue, or throat Heart failure--shortness of breath, swelling of the ankles, feet, or hands, sudden weight gain, unusual weakness or fatigue Low blood pressure--dizziness, feeling faint or lightheaded, blurry vision Raynaud's--cool, numb, or painful fingers or toes that may change color from pale, to blue, to red Slow heartbeat--dizziness, feeling faint or lightheaded, confusion, trouble breathing, unusual weakness or fatigue Worsening mood, feelings of depression Side effects that usually do not require medical attention (report to your care team if they continue or are bothersome): Change in sex drive or performance Diarrhea Dizziness Fatigue Headache This list may not describe all possible side effects. Call your doctor for medical advice about side effects. You may report side effects to FDA at 1-800-FDA-1088. Where should I keep my medication? Keep out of the reach of children and pets. Store at room temperature between 20  and 25 degrees C (68 and 77 degrees F). Protect from moisture. Keep the container tightly closed. Throw away any unused medication after the expiration date. NOTE: This sheet is a summary. It may not cover all possible information. If you have questions about this medicine, talk to your doctor, pharmacist, or health care provider.  2024 Elsevier/Gold Standard (2022-10-22 00:00:00)

## 2023-05-16 NOTE — Chronic Care Management (AMB) (Signed)
Chronic Care Management   CCM RN Visit Note  05/16/2023 Name: Kirsten [name] MRN: [id number] DOB: 05-15-1946  Subjective: Kirsten [name] is a 77 y.o. year old female who is a primary care patient of Smitty Cords, DO. The patient was referred to the Chronic Care Management team for assistance with care management needs subsequent to provider initiation of CCM services and plan of care.    Today's Visit:  Engaged with patient by telephone for follow up visit.     SDOH Interventions Today    Flowsheet Row Most Recent Value  SDOH Interventions   Health Literacy Interventions Other (Comment)  [the patient will be in the office tomorrow for AWV and have sent a secure message to the AWV nurse asking for assistance with understanding her high blood pressures and making sure she understands how to take her blood pressure correctly]         Goals Addressed             This Visit's Progress    CCM Expected outcome:  Monitor, Self-Manage and Reduce Symptoms of HLD       Current Barriers:  Care Coordination needs related to the patients chronic conditions and how to effectively manage in a patient with HLD and other chronic conditions Chronic Disease Management support and education needs related to effective management of HLD Lacks caregiver support.  Lab Results  Component Value Date   CHOL 144 04/23/2023   HDL 42 (L) 04/23/2023   LDLCALC 88 04/23/2023   TRIG 58 04/23/2023   CHOLHDL 3.4 04/23/2023     Planned Interventions: Provider established cholesterol goals reviewed. Review with the patient. The patient is at goal; however her blood pressures are out of control. Education and support given. She will see the AWV nurse tomorrow in the office. Instructions given to the patient.; Counseled on importance of regular laboratory monitoring as prescribed. Has regular labs done. The patients labs are up to date. ; Provided HLD educational materials; Reviewed role and  benefits of statin for ASCVD risk reduction; Discussed strategies to manage statin-induced myalgias; Reviewed importance of limiting foods high in cholesterol. Review and education provided. ; Reviewed exercise goals and target of 150 minutes per week; Screening for signs and symptoms of depression related to chronic disease state;  Assessed social determinant of health barriers;   Symptom Management: Take medications as prescribed   Attend all scheduled provider appointments Call provider office for new concerns or questions  call the Suicide and Crisis Lifeline: 988 call the [place] National Suicide Prevention Lifeline: 219-872-1316 or TTY: 901-614-3085 TTY (281)632-2737) to talk to a trained counselor call 1-800-273-TALK (toll free, 24 hour hotline) if experiencing a Mental Health or Behavioral Health Crisis  - take all medications exactly as prescribed - call doctor with any symptoms you believe are related to your medicine - call doctor when you experience any new symptoms - go to all doctor appointments as scheduled - adhere to prescribed diet: heart healthy diet   Follow Up Plan: Telephone follow up appointment with care management team member scheduled for: 06-27-2023 at 0900 am       CCM Expected Outcome:  Monitor, Self-Manage and Reduce Symptoms of: Arthritis       Current Barriers:  Knowledge Deficits related to how to effectively manage pain and discomfort Chronic Disease Management support and education needs related to chronic pain and discomfort  Planned Interventions: Reviewed provider established plan for pain management. Denies any acute pain concerns at  this time. Is safe and denies any falls. Education given.; Discussed importance of adherence to all scheduled medical appointments. Reminder given of AWV for 05-17-2023 at the office; Counseled on the importance of reporting any/all new or changed pain symptoms or management strategies to pain management  provider; Advised patient to report to care team affect of pain on daily activities; Discussed use of relaxation techniques and/or diversional activities to assist with pain reduction (distraction, imagery, relaxation, massage, acupressure, TENS, heat, and cold application; Reviewed with patient prescribed pharmacological and nonpharmacological pain relief strategies; Advised patient to discuss unresolved pain, changes in level or intensity of pain with provider; Screening for signs and symptoms of depression related to chronic disease state;  Assessed social determinant of health barriers;   Symptom Management: Take medications as prescribed   Attend all scheduled provider appointments Call provider office for new concerns or questions  call the Suicide and Crisis Lifeline: 988 call the [place] National Suicide Prevention Lifeline: (814)683-6203 or TTY: 7271923130 TTY (951)724-1541) to talk to a trained counselor call 1-800-273-TALK (toll free, 24 hour hotline) if experiencing a Mental Health or Behavioral Health Crisis   Follow Up Plan: Telephone follow up appointment with care management team member scheduled for: 06-27-2023 at 0900 am       CCM Expected Outcome:  Monitor, Self-Manage, and Reduce Symptoms of Hypertension       Current Barriers:  Knowledge Deficits related to medications and side effects of medications and the importance of reporting new sx and sx to the provider when they occur Care Coordination needs related to resources to help the patient in her home and in managing her health and well being in a patient with HTN and other chronic conditions Chronic Disease Management support and education needs related to effective management of HTN Lacks caregiver support.  Cognitive Deficits  BP Readings from Last 3 Encounters:  04/17/23 (!) 168/88  04/02/23 (!) 154/90  03/25/23 (!) 166/86    Planned Interventions: Evaluation of current treatment plan related to hypertension  self management and patient's adherence to plan as established by provider. The patient is not taking her carvedilol any longer as she states that it was causing her right temple to hurt when she took it.  Have collaborated with the pcp via secure chat. Have also ask the patient to bring her readings and her cuff with her to the office on 05-17-2023 when she is scheduled to have her AWV. Will collaborate with the nurse and ask for assistance with making sure she is taking her blood pressures correctly. Concern is that she is not taking her medications and stops without letting the pcp or nurse know. Extensive education given today on the importance of taking her blood pressure medication and keeping a record of her readings at home. Have alerted the team;   Provided education to patient re: stroke prevention, s/s of heart attack and stroke; Reviewed prescribed diet Heart healthy diet. The patient states she is eating healthy. She does not eat a lot due to GI issues but denies any acute changes in her dietary habits.  Reviewed medications with patient and discussed importance of compliance. Is not compliant with medications. Has stopped taking her carvedilol. Pcp notified. The patient has a long standing history of non-compliance especially with HTN medications. Collaboration with the pcp and pharm D;  Discussed plans with patient for ongoing care management follow up and provided patient with direct contact information for care management team; Advised patient, providing education and rationale, to  monitor blood pressure daily and record, calling PCP for findings outside established parameters. The patient could not find her list but she did take her blood pressures while on the phone with the RNCM. Left arm: 180/112 and pulse 69; right arm 172/106, pulse 68. Review of the goal of blood pressures systolic <150 and diastolic <90. Education on the importance of taking medications and checking blood pressures on a  regular basis. ;  Reviewed scheduled/upcoming provider appointments including: 07-10-2023 Advised patient to discuss changes in her blood pressure, new onset of dizziness, questions or concerns with provider; Provided education on prescribed diet heart healthy;  Discussed complications of poorly controlled blood pressure such as heart disease, stroke, circulatory complications, vision complications, kidney impairment, sexual dysfunction;  Screening for signs and symptoms of depression related to chronic disease state;  Assessed social determinant of health barriers;  The patient is asking for help in the home. She feels she needs a Estate agent". Explained she may not qualify for additional help in the home. Will collaborate with the pcp on patients expressed needs. Will also alert SW for additional support.   Symptom Management: Take medications as prescribed   Attend all scheduled provider appointments Call provider office for new concerns or questions  call the Suicide and Crisis Lifeline: 988 call the [place] National Suicide Prevention Lifeline: 5805152629 or TTY: 2508252949 TTY (239) 815-9473) to talk to a trained counselor call 1-800-273-TALK (toll free, 24 hour hotline) if experiencing a Mental Health or Behavioral Health Crisis  check blood pressure daily write blood pressure results in a log or diary learn about high blood pressure take blood pressure log to all doctor appointments call doctor for signs and symptoms of high blood pressure develop an action plan for high blood pressure keep all doctor appointments take medications for blood pressure exactly as prescribed report new symptoms to your doctor  Follow Up Plan: Telephone follow up appointment with care management team member scheduled for: 06-27-2023 at 0900 am          Plan:Telephone follow up appointment with care management team member scheduled for:  06-27-2023 at 0900 am  Alto Denver RN, MSN, CCM RN Care Manager   Chronic Care Management Direct Number: [phone]

## 2023-05-16 NOTE — Telephone Encounter (Signed)
I called the patient and scheduled her for an appt on Tuesday, July 16th.

## 2023-05-17 ENCOUNTER — Ambulatory Visit (INDEPENDENT_AMBULATORY_CARE_PROVIDER_SITE_OTHER): Payer: 59

## 2023-05-17 VITALS — Ht 61.0 in | Wt 115.0 lb

## 2023-05-17 DIAGNOSIS — Z0000 Encounter for general adult medical examination without abnormal findings: Secondary | ICD-10-CM
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
## 2023-05-17 NOTE — Progress Notes (Signed)
Subjective:   Kirsten [name] is a 77 y.o. female who presents for Medicare Annual (Subsequent) preventive examination.  Visit Complete: Virtual  I connected with  Kirsten [name] on 05/17/23 by a audio enabled telemedicine application and verified that I am speaking with the correct person using two identifiers.  Patient Location: Home  Provider Location: Home Office  I discussed the limitations of evaluation and management by telemedicine. The patient expressed understanding and agreed to proceed.  Patient Medicare AWV questionnaire was completed by the patient on ; I have confirmed that all information answered by patient is correct and no changes since this date.  Review of Systems     Cardiac Risk Factors include: advanced age (>53men, >64 women);hypertension     Objective:    Today's Vitals   05/17/23 1019  Weight: 115 lb (52.2 kg)  Height: 5\' 1"  (1.549 m)   Body mass index is 21.73 kg/m.     05/17/2023   10:28 AM 11/19/2022    9:40 AM 05/11/2022    9:45 AM 05/02/2021    9:42 AM 03/29/2020   11:18 AM 02/13/2020   12:57 PM 04/15/2018   10:59 AM  Advanced Directives  Does Patient Have a Medical Advance Directive? No No No No No No No  Would patient like information on creating a medical advance directive? No - Patient declined No - Patient declined No - Patient declined  Yes (MAU/Ambulatory/Procedural Areas - Information given) No - Patient declined No - Patient declined    Current Medications (verified) Outpatient Encounter Medications as of 05/17/2023  Medication Sig   albuterol (PROVENTIL HFA) 108 (90 Base) MCG/ACT inhaler Inhale 2 puffs into the lungs every 6 (six) hours as needed for wheezing or shortness of breath.   aspirin EC 81 MG tablet Take 1 tablet (81 mg total) by mouth daily.   carvedilol (COREG) 6.25 MG tablet Take 1 tablet (6.25 mg total) by mouth 2 (two) times daily with a meal. (Patient not taking: Reported on 05/16/2023)   gabapentin (NEURONTIN) 300  MG capsule Take 1 capsule (300 mg total) by mouth at bedtime.   loratadine (CLARITIN) 10 MG tablet Take 1 tablet (10 mg total) by mouth daily.   omega-3 acid ethyl esters (LOVAZA) 1 g capsule Take 1 g by mouth daily.   Respir Rate Dev-Bld Press Mon (RESPERATE 1.0) KIT Use daily to help lower blood pressure   tiZANidine (ZANAFLEX) 4 MG tablet Take 1 tablet (4 mg total) by mouth every 8 (eight) hours as needed for muscle spasms.   traZODone (DESYREL) 50 MG tablet Take 1 tablet (50 mg total) by mouth at bedtime as needed for sleep. (Patient not taking: Reported on 05/16/2023)   No facility-administered encounter medications on file as of 05/17/2023.    Allergies (verified) Hydrochlorothiazide   History: Past Medical History:  Diagnosis Date   Allergy    Arthritis    Asthma    Back injury    Heart disease    History of blood clots    History of chicken pox    Hyperlipidemia    Hypertension    Memory loss    Migraine headache    Myocardial infarction [facility])    Past Surgical History:  Procedure Laterality Date   ABDOMINAL HYSTERECTOMY     APPENDECTOMY     BOWEL RESECTION     COLONOSCOPY WITH PROPOFOL N/A 06/17/2017   Procedure: COLONOSCOPY WITH PROPOFOL;  Surgeon: Christena Deem, MD;  Location: [facility] ENDOSCOPY;  Service: Endoscopy;  Laterality: N/A;   Family History  Problem Relation Age of Onset   Cancer Father        unknown type   Social History   Socioeconomic History   Marital status: Widowed    Spouse name: Not on file   Number of children: Not on file   Years of education: Not on file   Highest education level: Not on file  Occupational History   Not on file  Tobacco Use   Smoking status: Never   Smokeless tobacco: Never  Vaping Use   Vaping status: Never Used  Substance and Sexual Activity   Alcohol use: No    Alcohol/week: 0.0 standard drinks of alcohol   Drug use: No   Sexual activity: Not on file  Other Topics Concern   Not on file  Social History  Narrative   Not on file   Social Determinants of Health   Financial Resource Strain: Low Risk  (05/17/2023)   Overall Financial Resource Strain (CARDIA)    Difficulty of Paying Living Expenses: Not hard at all  Food Insecurity: No Food Insecurity (05/17/2023)   Hunger Vital Sign    Worried About Running Out of Food in the Last Year: Never true    Ran Out of Food in the Last Year: Never true  Transportation Needs: No Transportation Needs (05/17/2023)   PRAPARE - Administrator, Civil Service (Medical): No    Lack of Transportation (Non-Medical): No  Physical Activity: Inactive (05/17/2023)   Exercise Vital Sign    Days of Exercise per Week: 0 days    Minutes of Exercise per Session: 0 min  Stress: No Stress Concern Present (05/17/2023)   [employer] of Occupational Health - Occupational Stress Questionnaire    Feeling of Stress : Not at all  Social Connections: Moderately Integrated (05/17/2023)   Social Connection and Isolation Panel [NHANES]    Frequency of Communication with Friends and Family: More than three times a week    Frequency of Social Gatherings with Friends and Family: More than three times a week    Attends Religious Services: More than 4 times per year    Active Member of [employer] or Organizations: Yes    Attends Banker Meetings: More than 4 times per year    Marital Status: Widowed  Recent Concern: Social Connections - Moderately Isolated (04/15/2023)   Social Connection and Isolation Panel [NHANES]    Frequency of Communication with Friends and Family: More than three times a week    Frequency of Social Gatherings with Friends and Family: Once a week    Attends Religious Services: More than 4 times per year    Active Member of [employer] or Organizations: No    Attends Banker Meetings: Never    Marital Status: Widowed    Tobacco Counseling Counseling given: Not Answered   Clinical Intake:  Pre-visit preparation completed:  Yes  Pain : No/denies pain     BMI - recorded: 21.73 Nutritional Status: BMI of 19-24  Normal Nutritional Risks: None Diabetes: No  How often do you need to have someone help you when you read instructions, pamphlets, or other written materials from your doctor or pharmacy?: 1 - Never  Interpreter Needed?: No  Information entered by :: Theresa Mulligan LPN   Activities of Daily Living    05/17/2023   10:26 AM 04/17/2023    2:01 PM  In your present state of health, do you have  any difficulty performing the following activities:  Hearing? 0 0  Vision? 0 0  Difficulty concentrating or making decisions? 0 1  Walking or climbing stairs? 0 0  Dressing or bathing? 0 0  Doing errands, shopping? 0 0  Preparing Food and eating ? N   Using the Toilet? N   In the past six months, have you accidently leaked urine? Y   Comment Wears pads. Followed by PCP   Do you have problems with loss of bowel control? N   Managing your Medications? N   Managing your Finances? N   Housekeeping or managing your Housekeeping? N     Patient Care Team: Smitty Cords, DO as PCP - General (Family Medicine) Gustavus Bryant, LCSW as Social Worker (Licensed Clinical Social Worker) Ronney Asters, Jackelyn Poling, RPH-CPP as Pharmacist Marlowe Sax, RN as Case Manager (General Practice) Ripley Fraise, Angelina Sheriff as Social Worker Clemons, Angelina Sheriff as Social Worker  Indicate any recent [employer] you may have received from other than [facility] providers in the past year (date may be approximate).     Assessment:   This is a routine wellness examination for [facility].  Hearing/Vision screen Hearing Screening - Comments:: Denies hearing difficulties   Vision Screening - Comments:: Wears rx glasses - up to date with routine eye exams with  Dr Marti Sleigh  Dietary issues and exercise activities discussed:     Goals Addressed               This Visit's Progress     Stay Healthy (pt-stated)          Depression Screen    05/17/2023   10:25 AM 04/17/2023    2:01 PM 04/15/2023    3:58 PM 11/07/2022    1:39 PM 08/21/2022    9:34 AM 05/11/2022    9:43 AM 02/19/2022   10:37 AM  PHQ 2/9 Scores  PHQ - 2 Score 0 0 0 0 0 0 0  PHQ- 9 Score 0 3  4 0 0 7    Fall Risk    05/17/2023   10:27 AM 04/17/2023    2:00 PM 04/15/2023    3:48 PM 11/07/2022    1:39 PM 08/21/2022    9:34 AM  Fall Risk   Falls in the past year? 0 0 0 0 0  Number falls in past yr: 0 0 0 0 0  Injury with Fall? 0 0  0 0  Risk for fall due to : No Fall Risks No Fall Risks No Fall Risks No Fall Risks No Fall Risks  Follow up Falls prevention discussed Falls evaluation completed  Falls evaluation completed Falls evaluation completed    MEDICARE RISK AT HOME:  Medicare Risk at Home - 05/17/23 1033     Any stairs in or around the home? No    If so, are there any without handrails? No    Home free of loose throw rugs in walkways, pet beds, electrical cords, etc? Yes    Adequate lighting in your home to reduce risk of falls? Yes    Life alert? No    Use of a cane, walker or w/c? Yes    Grab bars in the bathroom? No    Shower chair or bench in shower? Yes    Elevated toilet seat or a handicapped toilet? No             TIMED UP AND GO:  Was the test performed?  No  Cognitive Function:        05/17/2023   10:28 AM 04/15/2018   10:59 AM 04/09/2017   10:44 AM  6CIT Screen  What Year? 0 points 0 points 0 points  What month? 0 points 0 points 0 points  What time? 0 points 0 points 0 points  Count back from 20 0 points 0 points 0 points  Months in reverse 0 points 0 points 4 points  Repeat phrase 0 points 0 points 6 points  Total Score 0 points 0 points 10 points    Immunizations Immunization History  Administered Date(s) Administered   Fluad Quad(high Dose 65+) 11/09/2019   Influenza, High Dose Seasonal PF 09/20/2015, 09/15/2018, 07/21/2019, 06/25/2022   Influenza-Unspecified 08/23/2017, 12/10/2017    Moderna Covid-19 Vaccine Bivalent Booster 58yrs & up 08/28/2022   Moderna Sars-Covid-2 Vaccination 04/18/2021   PFIZER(Purple Top)SARS-COV-2 Vaccination 12/19/2019, 01/09/2020, 08/22/2020   Pneumococcal Conjugate-13 08/16/2014   Pneumococcal Polysaccharide-23 02/10/2014   Respiratory Syncytial Virus Vaccine,Recomb Aduvanted(Arexvy) 08/28/2022   Tdap 02/10/2014   Zoster Recombinant(Shingrix) 11/09/2019, 05/01/2022    TDAP status: Up to date  Flu Vaccine status: Up to date  Pneumococcal vaccine status: Up to date    Qualifies for Shingles Vaccine? Yes   Zostavax completed Yes   Shingrix Completed?: Yes  Screening Tests Health Maintenance  Topic Date Due   INFLUENZA VACCINE  06/06/2023   DTaP/Tdap/Td (2 - Td or Tdap) 02/11/2024   Medicare Annual Wellness (AWV)  05/16/2024   Colonoscopy  06/18/2027   Pneumonia Vaccine 12+ Years old  Completed   DEXA SCAN  Completed   Hepatitis C Screening  Completed   Zoster Vaccines- Shingrix  Completed   HPV VACCINES  Aged Out   COVID-19 Vaccine  Discontinued    Health Maintenance  There are no preventive care reminders to display for this patient.   Colorectal cancer screening: Type of screening: Colonoscopy. Completed 06/17/17. Repeat every   years  Mammogram status: No longer required due to Age.  Bone Density status: Completed 07/06/14. Results reflect: Bone density results: OSTEOPOROSIS. Repeat every   years.  Lung Cancer Screening: (Low Dose CT Chest recommended if Age 5-80 years, 20 pack-year currently smoking OR have quit w/in 15years.) does not qualify.    Additional Screening:  Hepatitis C Screening: does qualify; Completed 09/20/15  Vision Screening: Recommended annual ophthalmology exams for early detection of glaucoma and other disorders of the eye. Is the patient up to date with their annual eye exam?  Yes  Who is the provider or what is the name of the office in which the patient attends annual eye exams? Dr Marti Sleigh   If pt is not established with a provider, would they like to be referred to a provider to establish care? No .   Dental Screening: Recommended annual dental exams for proper oral hygiene    Community Resource Referral / Chronic Care Management:  CRR required this visit?  No   CCM required this visit?  No     Plan:     I have personally reviewed and noted the following in the patient's chart:   Medical and social history Use of alcohol, tobacco or illicit drugs  Current medications and supplements including opioid prescriptions. Patient is not currently taking opioid prescriptions. Functional ability and status Nutritional status Physical activity Advanced directives List of other physicians Hospitalizations, surgeries, and ER visits in previous 12 months Vitals Screenings to include cognitive, depression, and falls Referrals and appointments  In addition, I have reviewed  and discussed with patient certain preventive protocols, quality metrics, and best practice recommendations. A written personalized care plan for preventive services as well as general preventive health recommendations were provided to patient.     Tillie Rung, LPN   1/61/0960   After Visit Summary: (MyChart) Due to this being a telephonic visit, the after visit summary with patients personalized plan was offered to patient via MyChart   Nurse Notes: Patient request advised referral for Personal Care Aide

## 2023-05-20 ENCOUNTER — Ambulatory Visit: Payer: Self-pay

## 2023-05-20 NOTE — Patient Instructions (Signed)
Visit Information  Thank you for taking time to visit with me today. Please don't hesitate to contact me if I can be of assistance to you.   Following are the goals we discussed today:   Goals Addressed             This Visit's Progress    COMPLETED: Obtain personal care services       -Patient wants assistance with medication management, cooking, monitoring blood pressure, managing appointments -Patient will speak to provider on 04/17/23 to request order  04/22/23  -Patient will speak to provider on 04/22/23 to request order  04/24/23 -Patient will speak to provider on 05/03/23 to request order -Patient will speak to pharmacy team on 04/29/23 to manage medications -Patient wants to speak to nurse for support with blood pressure monitor   Care Coordination Interventions: Patient is confused about upcoming appointments Patient wants rx in blister packs Patient will communicate need to Estelle Grumbles in Pharmacy to arrange   Care Coordination Interventions: Patient has communicated with nurse and feels more confident with her blood pressure monitor Patient no longer wants to pursue personal care as she can perform daily activities independently Patient no longer wants to pursue blister packs with the Pharmacy           If you are experiencing a Mental Health or Behavioral Health Crisis or need someone to talk to, please call 911  Patient verbalizes understanding of instructions and care plan provided today and agrees to view in MyChart. Active MyChart status and patient understanding of how to access instructions and care plan via MyChart confirmed with patient.     No further follow up required:    Lysle Morales, BSW Social Worker [facility] Care Management  [phone]

## 2023-05-20 NOTE — Patient Outreach (Signed)
  Care Coordination   Follow Up Visit Note   05/20/2023 Name: Kirsten [name] MRN: [id number] DOB: Aug 12, 1946  Kirsten [name] is a 77 y.o. year old female who sees Smitty Cords, DO for primary care. I spoke with  Kirsten [name] by phone today.  What matters to the patients health and wellness today?  Patient has communicated with the nurse and feels more confident with the blood pressure monitor.  Patient spoke to staff about personal care and she does not need the service at this time.    Goals Addressed             This Visit's Progress    COMPLETED: Obtain personal care services       -Patient wants assistance with medication management, cooking, monitoring blood pressure, managing appointments -Patient will speak to provider on 04/17/23 to request order  04/22/23  -Patient will speak to provider on 04/22/23 to request order  04/24/23 -Patient will speak to provider on 05/03/23 to request order -Patient will speak to pharmacy team on 04/29/23 to manage medications -Patient wants to speak to nurse for support with blood pressure monitor   Care Coordination Interventions: Patient is confused about upcoming appointments Patient wants rx in blister packs Patient will communicate need to Estelle Grumbles in Pharmacy to arrange   Care Coordination Interventions: Patient has communicated with nurse and feels more confident with her blood pressure monitor Patient no longer wants to pursue personal care as she can perform daily activities independently Patient no longer wants to pursue blister packs with the Pharmacy          SDOH assessments and interventions completed:  No     Care Coordination Interventions:  Yes, provided  Interventions Today    Flowsheet Row Most Recent Value  General Interventions   General Interventions Discussed/Reviewed General Interventions Reviewed  [Patient feels she can manage daily activites and no longer needs personal care for  blood pressure monitoring and medication management.]  Pharmacy Interventions   Pharmacy Dicussed/Reviewed Pharmacy Topics Reviewed  [Patient declines blister pack and wants to keep rx in bottles]        Follow up plan: No further intervention required.   Encounter Outcome:  Pt. Visit Completed

## 2023-05-21 ENCOUNTER — Ambulatory Visit: Payer: 59 | Admitting: Family Medicine

## 2023-05-24 ENCOUNTER — Telehealth: Payer: 59

## 2023-05-29 ENCOUNTER — Telehealth: Payer: 59

## 2023-06-03 ENCOUNTER — Telehealth: Payer: 59

## 2023-06-05 DIAGNOSIS — M1990 Unspecified osteoarthritis, unspecified site: Secondary | ICD-10-CM | POA: Diagnosis not present

## 2023-06-05 DIAGNOSIS — I10 Essential (primary) hypertension: Secondary | ICD-10-CM | POA: Diagnosis not present

## 2023-06-05 DIAGNOSIS — E785 Hyperlipidemia, unspecified: Secondary | ICD-10-CM | POA: Diagnosis not present

## 2023-06-12 DIAGNOSIS — H35033 Hypertensive retinopathy, bilateral: Secondary | ICD-10-CM | POA: Diagnosis not present

## 2023-06-13 NOTE — Patient Outreach (Signed)
Error duplicate Lysle Morales, BSW Social Worker [facility] Care Management  [phone]

## 2023-06-27 ENCOUNTER — Other Ambulatory Visit: Payer: Self-pay

## 2023-06-27 ENCOUNTER — Other Ambulatory Visit: Payer: 59

## 2023-06-27 NOTE — Patient Instructions (Signed)
Visit Information  Thank you for taking time to visit with me today. Please don't hesitate to contact me if I can be of assistance to you before our next scheduled telephone appointment.  Following are the goals we discussed today:   Goals Addressed             This Visit's Progress    RNCM Care Management  Expected Outcome:  Monitor, Self-Manage and Reduce Symptoms of: Arthritis       Current Barriers:  Knowledge Deficits related to how to effectively manage pain and discomfort Chronic Disease Management support and education needs related to chronic pain and discomfort  Planned Interventions: Reviewed provider established plan for pain management. Denies any acute pain concerns at this time. Is safe and denies any falls. Education given.; Discussed importance of adherence to all scheduled medical appointments. The patient only takes a select few of medications. The patient denies any medication needs at this time. Counseled on the importance of reporting any/all new or changed pain symptoms or management strategies to pain management provider; Advised patient to report to care team affect of pain on daily activities; Discussed use of relaxation techniques and/or diversional activities to assist with pain reduction (distraction, imagery, relaxation, massage, acupressure, TENS, heat, and cold application; Reviewed with patient prescribed pharmacological and nonpharmacological pain relief strategies; Advised patient to discuss unresolved pain, changes in level or intensity of pain with provider; Screening for signs and symptoms of depression related to chronic disease state;  Assessed social determinant of health barriers;   Symptom Management: Take medications as prescribed   Attend all scheduled provider appointments Call provider office for new concerns or questions  call the Suicide and Crisis Lifeline: 988 call the [place] National Suicide Prevention Lifeline: 628-330-1471 or TTY:  785-476-6384 TTY (705)747-8777) to talk to a trained counselor call 1-800-273-TALK (toll free, 24 hour hotline) if experiencing a Mental Health or Behavioral Health Crisis   Follow Up Plan: Telephone follow up appointment with care management team member scheduled for: 08-30-2023 at 0900 am       RNCM Care Management Expected outcome:  Monitor, Self-Manage and Reduce Symptoms of HLD       Current Barriers:  Care Coordination needs related to the patients chronic conditions and how to effectively manage in a patient with HLD and other chronic conditions Chronic Disease Management support and education needs related to effective management of HLD Lacks caregiver support.  Lab Results  Component Value Date   CHOL 144 04/23/2023   HDL 42 (L) 04/23/2023   LDLCALC 88 04/23/2023   TRIG 58 04/23/2023   CHOLHDL 3.4 04/23/2023     Planned Interventions: Provider established cholesterol goals reviewed. Review with the patient. The patient is at goal; however her blood pressures are out of control. Education and support given.  Counseled on importance of regular laboratory monitoring as prescribed. Has regular labs done. The patients labs are up to date. ; Provided HLD educational materials; Reviewed role and benefits of statin for ASCVD risk reduction; Discussed strategies to manage statin-induced myalgias. The patient does not take statins. The patient does not like to take medications due to side effects; Reviewed importance of limiting foods high in cholesterol. Review and education provided. ; Reviewed exercise goals and target of 150 minutes per week; Screening for signs and symptoms of depression related to chronic disease state;  Assessed social determinant of health barriers;   Symptom Management: Take medications as prescribed   Attend all scheduled provider appointments Call provider  office for new concerns or questions  call the Suicide and Crisis Lifeline: 988 call the [place]  National Suicide Prevention Lifeline: 607 657 2398 or TTY: 307-513-4812 TTY 813-213-4550) to talk to a trained counselor call 1-800-273-TALK (toll free, 24 hour hotline) if experiencing a Mental Health or Behavioral Health Crisis  - take all medications exactly as prescribed - call doctor with any symptoms you believe are related to your medicine - call doctor when you experience any new symptoms - go to all doctor appointments as scheduled - adhere to prescribed diet: heart healthy diet   Follow Up Plan: Telephone follow up appointment with care management team member scheduled for: 08-30-2023 at 0900 am       RNCM Care Management Expected Outcome:  Monitor, Self-Manage, and Reduce Symptoms of Hypertension       Current Barriers:  Knowledge Deficits related to medications and side effects of medications and the importance of reporting new sx and sx to the provider when they occur Care Coordination needs related to resources to help the patient in her home and in managing her health and well being in a patient with HTN and other chronic conditions Chronic Disease Management support and education needs related to effective management of HTN Lacks caregiver support.  Cognitive Deficits  BP Readings from Last 3 Encounters:  04/17/23 (!) 168/88  04/02/23 (!) 154/90  03/25/23 (!) 166/86    Planned Interventions: Evaluation of current treatment plan related to hypertension self management and patient's adherence to plan as established by provider. The patient is not taking her carvedilol any longer as she states that it was causing her right temple to hurt when she took it.  Have collaborated with the pcp via secure chat. Have also ask the patient to bring her readings and her cuff with her to the office on 05-17-2023 when she is scheduled to have her AWV. Will collaborate with the nurse and ask for assistance with making sure she is taking her blood pressures correctly. Concern is that she is  not taking her medications and stops without letting the pcp or nurse know. Extensive education given today on the importance of taking her blood pressure medication and keeping a record of her readings at home. Have alerted the team. The patient is still not taking her medications. She says they make her feel funny. The patient states that her blood pressures are still high. The patient states she has not taken it in a couple of days because she has been frustrated. Extensive education provided to the patient concerning the need to take her medications and the risk of not keeping her blood pressures under control. The patient states she is not going to take the medications. She also says that she is considering changing her care to senior center. Reflective listening and support given. Will collaborate with the provider and pharm D about the patient still not being compliant with her blood pressure medications. Reminder given today for the patient to make sure she keeps her appointment for 07-10-2023.;   Provided education to patient re: stroke prevention, s/s of heart attack and stroke; Reviewed prescribed diet Heart healthy diet. The patient states she is eating healthy. She does not eat a lot due to GI issues but denies any acute changes in her dietary habits.  Reviewed medications with patient and discussed importance of compliance. Is not compliant with medications. Has stopped taking her carvedilol. Pcp notified. The patient has a long standing history of non-compliance especially with HTN medications. Collaboration  with the pcp and pharm D. Remains non compliant with medications. Review and education provided. ;  Discussed plans with patient for ongoing care management follow up and provided patient with direct contact information for care management team; Advised patient, providing education and rationale, to monitor blood pressure daily and record, calling PCP for findings outside established  parameters. The patient could not find her list but she did take her blood pressures while on the phone with the RNCM. Left arm: 180/112 and pulse 69; right arm 172/106, pulse 68. Review of the goal of blood pressures systolic <150 and diastolic <90. Education on the importance of taking medications and checking blood pressures on a regular basis. The patient states she has not taken her readings in a couple of days. The patient says she has gotten frustrated with it. She could not provide any readings she just stated that it was "high" ;  Reviewed scheduled/upcoming provider appointments including: 07-10-2023 Advised patient to discuss changes in her blood pressure, new onset of dizziness, questions or concerns with provider; Provided education on prescribed diet heart healthy;  Discussed complications of poorly controlled blood pressure such as heart disease, stroke, circulatory complications, vision complications, kidney impairment, sexual dysfunction;  Screening for signs and symptoms of depression related to chronic disease state;  Assessed social determinant of health barriers;  The patient has been going to the senior center and states that she is getting support there.   Symptom Management: Take medications as prescribed   Attend all scheduled provider appointments Call provider office for new concerns or questions  call the Suicide and Crisis Lifeline: 988 call the [place] National Suicide Prevention Lifeline: 539-268-2879 or TTY: 623-352-1300 TTY 4326441363) to talk to a trained counselor call 1-800-273-TALK (toll free, 24 hour hotline) if experiencing a Mental Health or Behavioral Health Crisis  check blood pressure daily write blood pressure results in a log or diary learn about high blood pressure take blood pressure log to all doctor appointments call doctor for signs and symptoms of high blood pressure develop an action plan for high blood pressure keep all doctor  appointments take medications for blood pressure exactly as prescribed report new symptoms to your doctor  Follow Up Plan: Telephone follow up appointment with care management team member scheduled for: 08-30-2023 at 0900 am           Our next appointment is by telephone on 08-30-2023 at 0900 am  Please call the care guide team at [phone] if you need to cancel or reschedule your appointment.   If you are experiencing a Mental Health or Behavioral Health Crisis or need someone to talk to, please call the Suicide and Crisis Lifeline: 988 call the [place] National Suicide Prevention Lifeline: (562)361-5837 or TTY: (339)403-0103 TTY 619-443-8137) to talk to a trained counselor call 1-800-273-TALK (toll free, 24 hour hotline)   The patient verbalized understanding of instructions, educational materials, and care plan provided today and DECLINED offer to receive copy of patient instructions, educational materials, and care plan.     Alto Denver RN, MSN, CCM RN Care Manager  [facility]  Ambulatory Care Management  Direct Number: [phone]

## 2023-06-27 NOTE — Patient Outreach (Signed)
Care Management   Visit Note  06/27/2023 Name: Kirsten [name] MRN: [id number] DOB: January 06, 1946  Subjective: Kirsten [name] is a 77 y.o. year old female who is a primary care patient of Smitty Cords, DO. The Care Management team was consulted for assistance.      Engaged with patient spoke with patient by telephone.    Goals Addressed             This Visit's Progress    RNCM Care Management  Expected Outcome:  Monitor, Self-Manage and Reduce Symptoms of: Arthritis       Current Barriers:  Knowledge Deficits related to how to effectively manage pain and discomfort Chronic Disease Management support and education needs related to chronic pain and discomfort  Planned Interventions: Reviewed provider established plan for pain management. Denies any acute pain concerns at this time. Is safe and denies any falls. Education given.; Discussed importance of adherence to all scheduled medical appointments. The patient only takes a select few of medications. The patient denies any medication needs at this time. Counseled on the importance of reporting any/all new or changed pain symptoms or management strategies to pain management provider; Advised patient to report to care team affect of pain on daily activities; Discussed use of relaxation techniques and/or diversional activities to assist with pain reduction (distraction, imagery, relaxation, massage, acupressure, TENS, heat, and cold application; Reviewed with patient prescribed pharmacological and nonpharmacological pain relief strategies; Advised patient to discuss unresolved pain, changes in level or intensity of pain with provider; Screening for signs and symptoms of depression related to chronic disease state;  Assessed social determinant of health barriers;   Symptom Management: Take medications as prescribed   Attend all scheduled provider appointments Call provider office for new concerns or questions  call the  Suicide and Crisis Lifeline: 988 call the [place] National Suicide Prevention Lifeline: 563-664-4250 or TTY: 248-067-0073 TTY 865-647-1754) to talk to a trained counselor call 1-800-273-TALK (toll free, 24 hour hotline) if experiencing a Mental Health or Behavioral Health Crisis   Follow Up Plan: Telephone follow up appointment with care management team member scheduled for: 08-30-2023 at 0900 am       RNCM Care Management Expected outcome:  Monitor, Self-Manage and Reduce Symptoms of HLD       Current Barriers:  Care Coordination needs related to the patients chronic conditions and how to effectively manage in a patient with HLD and other chronic conditions Chronic Disease Management support and education needs related to effective management of HLD Lacks caregiver support.  Lab Results  Component Value Date   CHOL 144 04/23/2023   HDL 42 (L) 04/23/2023   LDLCALC 88 04/23/2023   TRIG 58 04/23/2023   CHOLHDL 3.4 04/23/2023     Planned Interventions: Provider established cholesterol goals reviewed. Review with the patient. The patient is at goal; however her blood pressures are out of control. Education and support given.  Counseled on importance of regular laboratory monitoring as prescribed. Has regular labs done. The patients labs are up to date. ; Provided HLD educational materials; Reviewed role and benefits of statin for ASCVD risk reduction; Discussed strategies to manage statin-induced myalgias. The patient does not take statins. The patient does not like to take medications due to side effects; Reviewed importance of limiting foods high in cholesterol. Review and education provided. ; Reviewed exercise goals and target of 150 minutes per week; Screening for signs and symptoms of depression related to chronic disease state;  Assessed social determinant  of health barriers;   Symptom Management: Take medications as prescribed   Attend all scheduled provider appointments Call  provider office for new concerns or questions  call the Suicide and Crisis Lifeline: 988 call the [place] National Suicide Prevention Lifeline: 507-418-7663 or TTY: (641)076-1855 TTY (581)813-8348) to talk to a trained counselor call 1-800-273-TALK (toll free, 24 hour hotline) if experiencing a Mental Health or Behavioral Health Crisis  - take all medications exactly as prescribed - call doctor with any symptoms you believe are related to your medicine - call doctor when you experience any new symptoms - go to all doctor appointments as scheduled - adhere to prescribed diet: heart healthy diet   Follow Up Plan: Telephone follow up appointment with care management team member scheduled for: 08-30-2023 at 0900 am       RNCM Care Management Expected Outcome:  Monitor, Self-Manage, and Reduce Symptoms of Hypertension       Current Barriers:  Knowledge Deficits related to medications and side effects of medications and the importance of reporting new sx and sx to the provider when they occur Care Coordination needs related to resources to help the patient in her home and in managing her health and well being in a patient with HTN and other chronic conditions Chronic Disease Management support and education needs related to effective management of HTN Lacks caregiver support.  Cognitive Deficits  BP Readings from Last 3 Encounters:  04/17/23 (!) 168/88  04/02/23 (!) 154/90  03/25/23 (!) 166/86    Planned Interventions: Evaluation of current treatment plan related to hypertension self management and patient's adherence to plan as established by provider. The patient is not taking her carvedilol any longer as she states that it was causing her right temple to hurt when she took it.  Have collaborated with the pcp via secure chat. Have also ask the patient to bring her readings and her cuff with her to the office on 05-17-2023 when she is scheduled to have her AWV. Will collaborate with the nurse and  ask for assistance with making sure she is taking her blood pressures correctly. Concern is that she is not taking her medications and stops without letting the pcp or nurse know. Extensive education given today on the importance of taking her blood pressure medication and keeping a record of her readings at home. Have alerted the team. The patient is still not taking her medications. She says they make her feel funny. The patient states that her blood pressures are still high. The patient states she has not taken it in a couple of days because she has been frustrated. Extensive education provided to the patient concerning the need to take her medications and the risk of not keeping her blood pressures under control. The patient states she is not going to take the medications. She also says that she is considering changing her care to senior center. Reflective listening and support given. Will collaborate with the provider and pharm D about the patient still not being compliant with her blood pressure medications. Reminder given today for the patient to make sure she keeps her appointment for 07-10-2023.;   Provided education to patient re: stroke prevention, s/s of heart attack and stroke; Reviewed prescribed diet Heart healthy diet. The patient states she is eating healthy. She does not eat a lot due to GI issues but denies any acute changes in her dietary habits.  Reviewed medications with patient and discussed importance of compliance. Is not compliant with medications. Has  stopped taking her carvedilol. Pcp notified. The patient has a long standing history of non-compliance especially with HTN medications. Collaboration with the pcp and pharm D. Remains non compliant with medications. Review and education provided. ;  Discussed plans with patient for ongoing care management follow up and provided patient with direct contact information for care management team; Advised patient, providing education and  rationale, to monitor blood pressure daily and record, calling PCP for findings outside established parameters. The patient could not find her list but she did take her blood pressures while on the phone with the RNCM. Left arm: 180/112 and pulse 69; right arm 172/106, pulse 68. Review of the goal of blood pressures systolic <150 and diastolic <90. Education on the importance of taking medications and checking blood pressures on a regular basis. The patient states she has not taken her readings in a couple of days. The patient says she has gotten frustrated with it. She could not provide any readings she just stated that it was "high" ;  Reviewed scheduled/upcoming provider appointments including: 07-10-2023 Advised patient to discuss changes in her blood pressure, new onset of dizziness, questions or concerns with provider; Provided education on prescribed diet heart healthy;  Discussed complications of poorly controlled blood pressure such as heart disease, stroke, circulatory complications, vision complications, kidney impairment, sexual dysfunction;  Screening for signs and symptoms of depression related to chronic disease state;  Assessed social determinant of health barriers;  The patient has been going to the senior center and states that she is getting support there.   Symptom Management: Take medications as prescribed   Attend all scheduled provider appointments Call provider office for new concerns or questions  call the Suicide and Crisis Lifeline: 988 call the [place] National Suicide Prevention Lifeline: 416-156-2462 or TTY: 469-548-1648 TTY 204 206 8185) to talk to a trained counselor call 1-800-273-TALK (toll free, 24 hour hotline) if experiencing a Mental Health or Behavioral Health Crisis  check blood pressure daily write blood pressure results in a log or diary learn about high blood pressure take blood pressure log to all doctor appointments call doctor for signs and symptoms of  high blood pressure develop an action plan for high blood pressure keep all doctor appointments take medications for blood pressure exactly as prescribed report new symptoms to your doctor  Follow Up Plan: Telephone follow up appointment with care management team member scheduled for: 08-30-2023 at 0900 am           Consent to Services:  Patient was given information about care management services, agreed to services, and gave verbal consent to participate.   Plan: Telephone follow up appointment with care management team member scheduled for: 08-30-2023 at 0900 am  Alto Denver RN, MSN, CCM RN Care Manager  [facility]  Ambulatory Care Management  Direct Number: [phone]

## 2023-06-29 ENCOUNTER — Other Ambulatory Visit: Payer: Self-pay | Admitting: Family Medicine

## 2023-06-29 DIAGNOSIS — I10 Essential (primary) hypertension: Secondary | ICD-10-CM

## 2023-07-01 ENCOUNTER — Other Ambulatory Visit: Payer: Self-pay | Admitting: Family Medicine

## 2023-07-01 DIAGNOSIS — I10 Essential (primary) hypertension: Secondary | ICD-10-CM

## 2023-07-01 NOTE — Telephone Encounter (Signed)
Requested Prescriptions  Pending Prescriptions Disp Refills   carvedilol (COREG) 6.25 MG tablet [Pharmacy Med Name: CARVEDILOL 6.25MG  TABLETS] 180 tablet     Sig: TAKE 1 TABLET(6.25 MG) BY MOUTH TWICE DAILY WITH A MEAL     Cardiovascular: Beta Blockers 3 Failed - 06/29/2023 11:33 AM      Failed - Last BP in normal range    BP Readings from Last 1 Encounters:  04/17/23 (!) 168/88         Passed - Cr in normal range and within 360 days    Creat  Date Value Ref Range Status  04/23/2023 0.97 0.60 - 1.00 mg/dL Final         Passed - AST in normal range and within 360 days    AST  Date Value Ref Range Status  04/23/2023 18 10 - 35 U/L Final   SGOT(AST)  Date Value Ref Range Status  07/17/2014 24 15 - 37 Unit/L Final         Passed - ALT in normal range and within 360 days    ALT  Date Value Ref Range Status  04/23/2023 10 6 - 29 U/L Final   SGPT (ALT)  Date Value Ref Range Status  07/17/2014 18 U/L Final    Comment:    14-63 NOTE: New Reference Range 05/25/14          Passed - Last Heart Rate in normal range    Pulse Readings from Last 1 Encounters:  04/17/23 63         Passed - Valid encounter within last 6 months    Recent Outpatient Visits           2 months ago Primary hypertension   [place] [facility], Jackelyn Poling, RPH-CPP   2 months ago Primary hypertension   [place] [facility] [place], Jackelyn Poling, RPH-CPP   2 months ago Primary hypertension   [facility] [place], Netta Neat, DO   3 months ago Primary hypertension   [facility] Smitty Cords, DO   3 months ago Restless legs   [place] [facility] [place], Netta Neat, DO       Future Appointments             In 1 week Althea Charon, Netta Neat, DO  [facility], [facility]

## 2023-07-02 NOTE — Telephone Encounter (Signed)
Requested Prescriptions  Pending Prescriptions Disp Refills   carvedilol (COREG) 6.25 MG tablet [Pharmacy Med Name: CARVEDILOL 6.25MG  TABLETS] 180 tablet     Sig: TAKE 1 TABLET(6.25 MG) BY MOUTH TWICE DAILY WITH A MEAL     Cardiovascular: Beta Blockers 3 Failed - 07/01/2023  5:16 PM      Failed - Last BP in normal range    BP Readings from Last 1 Encounters:  04/17/23 (!) 168/88         Passed - Cr in normal range and within 360 days    Creat  Date Value Ref Range Status  04/23/2023 0.97 0.60 - 1.00 mg/dL Final         Passed - AST in normal range and within 360 days    AST  Date Value Ref Range Status  04/23/2023 18 10 - 35 U/L Final   SGOT(AST)  Date Value Ref Range Status  07/17/2014 24 15 - 37 Unit/L Final         Passed - ALT in normal range and within 360 days    ALT  Date Value Ref Range Status  04/23/2023 10 6 - 29 U/L Final   SGPT (ALT)  Date Value Ref Range Status  07/17/2014 18 U/L Final    Comment:    14-63 NOTE: New Reference Range 05/25/14          Passed - Last Heart Rate in normal range    Pulse Readings from Last 1 Encounters:  04/17/23 63         Passed - Valid encounter within last 6 months    Recent Outpatient Visits           2 months ago Primary hypertension   [place] [facility] Delles, Jackelyn Poling, RPH-CPP   2 months ago Primary hypertension   [place] [facility] Delles, Jackelyn Poling, RPH-CPP   2 months ago Primary hypertension   [place], Netta Neat, DO   3 months ago Primary hypertension   [place] [facility] Smitty Cords, DO   3 months ago Restless legs   [place] [facility], Netta Neat, DO       Future Appointments             In 1 week Al[place] Charon, Netta Neat, DO [place] [facility]

## 2023-07-04 DIAGNOSIS — H5710 Ocular pain, unspecified eye: Secondary | ICD-10-CM | POA: Diagnosis not present

## 2023-07-04 DIAGNOSIS — I10 Essential (primary) hypertension: Secondary | ICD-10-CM | POA: Diagnosis not present

## 2023-07-10 ENCOUNTER — Encounter: Payer: 59 | Admitting: Family Medicine

## 2023-08-29 ENCOUNTER — Emergency Department: Payer: 59

## 2023-08-29 ENCOUNTER — Encounter: Payer: Self-pay | Admitting: Emergency Medicine

## 2023-08-29 ENCOUNTER — Emergency Department
Admission: EM | Admit: 2023-08-29 | Discharge: 2023-08-29 | Disposition: A | Discharge status: Home / Self Care | Payer: 59 | Attending: Emergency Medicine | Admitting: Emergency Medicine

## 2023-08-29 ENCOUNTER — Other Ambulatory Visit: Payer: Self-pay

## 2023-08-29 DIAGNOSIS — D649 Anemia, unspecified: Secondary | ICD-10-CM | POA: Diagnosis not present

## 2023-08-29 DIAGNOSIS — R509 Fever, unspecified: Secondary | ICD-10-CM | POA: Diagnosis present

## 2023-08-29 DIAGNOSIS — R052 Subacute cough: Secondary | ICD-10-CM

## 2023-08-29 DIAGNOSIS — Z20822 Contact with and (suspected) exposure to covid-19: Secondary | ICD-10-CM | POA: Diagnosis not present

## 2023-08-29 DIAGNOSIS — I10 Essential (primary) hypertension: Secondary | ICD-10-CM | POA: Diagnosis not present

## 2023-08-29 DIAGNOSIS — B349 Viral infection, unspecified: Secondary | ICD-10-CM | POA: Insufficient documentation

## 2023-08-29 DIAGNOSIS — E876 Hypokalemia: Secondary | ICD-10-CM | POA: Insufficient documentation

## 2023-08-29 DIAGNOSIS — J45909 Unspecified asthma, uncomplicated: Secondary | ICD-10-CM | POA: Insufficient documentation

## 2023-08-29 LAB — CBC WITH DIFFERENTIAL/PLATELET
Abs Immature Granulocytes: 0.06 10*3/uL (ref 0.00–0.07)
Basophils Absolute: 0 10*3/uL (ref 0.0–0.1)
Basophils Relative: 0 %
Eosinophils Absolute: 0.2 10*3/uL (ref 0.0–0.5)
Eosinophils Relative: 2 %
HCT: 32.3 % — ABNORMAL LOW (ref 36.0–46.0)
Hemoglobin: 10.8 g/dL — ABNORMAL LOW (ref 12.0–15.0)
Immature Granulocytes: 1 %
Lymphocytes Relative: 17 %
Lymphs Abs: 1.7 10*3/uL (ref 0.7–4.0)
MCH: 25.8 pg — ABNORMAL LOW (ref 26.0–34.0)
MCHC: 33.4 g/dL (ref 30.0–36.0)
MCV: 77.1 fL — ABNORMAL LOW (ref 80.0–100.0)
Monocytes Absolute: 0.9 10*3/uL (ref 0.1–1.0)
Monocytes Relative: 10 %
Neutro Abs: 7 10*3/uL (ref 1.7–7.7)
Neutrophils Relative %: 70 %
Platelets: 557 10*3/uL — ABNORMAL HIGH (ref 150–400)
RBC: 4.19 MIL/uL (ref 3.87–5.11)
RDW: 13.8 % (ref 11.5–15.5)
WBC: 9.9 10*3/uL (ref 4.0–10.5)
nRBC: 0 % (ref 0.0–0.2)

## 2023-08-29 LAB — COMPREHENSIVE METABOLIC PANEL
ALT: 21 U/L (ref 0–44)
AST: 24 U/L (ref 15–41)
Albumin: 2.9 g/dL — ABNORMAL LOW (ref 3.5–5.0)
Alkaline Phosphatase: 80 U/L (ref 38–126)
Anion gap: 11 (ref 5–15)
BUN: 11 mg/dL (ref 8–23)
CO2: 26 mmol/L (ref 22–32)
Calcium: 8.5 mg/dL — ABNORMAL LOW (ref 8.9–10.3)
Chloride: 99 mmol/L (ref 98–111)
Creatinine, Ser: 0.83 mg/dL (ref 0.44–1.00)
GFR, Estimated: >60 mL/min (ref >60)
Glucose, Bld: 102 mg/dL — ABNORMAL HIGH (ref 70–99)
Potassium: 3.3 mmol/L — ABNORMAL LOW (ref 3.5–5.1)
Sodium: 136 mmol/L (ref 135–145)
Total Bilirubin: 0.7 mg/dL (ref 0.3–1.2)
Total Protein: 7.1 g/dL (ref 6.5–8.1)

## 2023-08-29 LAB — RESP PANEL BY RT-PCR (RSV, FLU A&B, COVID)  RVPGX2
Influenza A by PCR: NEGATIVE
Influenza B by PCR: NEGATIVE
Resp Syncytial Virus by PCR: NEGATIVE
SARS Coronavirus 2 by RT PCR: NEGATIVE

## 2023-08-29 LAB — IRON AND TIBC
Iron: 49 ug/dL (ref 28–170)
Saturation Ratios: 22 % (ref 10.4–31.8)
TIBC: 227 ug/dL — ABNORMAL LOW (ref 250–450)
UIBC: 178 ug/dL

## 2023-08-29 MED ORDER — AZITHROMYCIN 250 MG PO TABS: ORAL_TABLET | ORAL | 0 refills | Status: AC

## 2023-08-29 MED ORDER — PREDNISONE 50 MG PO TABS
50.0000 mg | ORAL_TABLET | Freq: Every day | ORAL | 0 refills | Status: AC
Start: 2023-08-29 — End: 2023-09-03

## 2023-08-29 NOTE — ED Triage Notes (Signed)
EMS brings pt in from home; to triage via w/c with no distress noted; pt reports recent fever and prod cough white sputum; denies pain, denies any other accomp symptoms

## 2023-08-29 NOTE — ED Notes (Signed)
See triage note  Presents with subjective fever and cough  States sx's started couple of days ago

## 2023-08-29 NOTE — Discharge Instructions (Addendum)
You were seen in the emergency department for a cough.  On lab work today you had anemia with a slow drop of your hemoglobin.  You need to follow-up closely with your primary care physician so they can do further workup to figure out why you are anemic.  If you have any signs of blood in your stool, shortness of breath or feeling like you are going to pass out is important that you return to the emergency department so you can have your hemoglobin rechecked.  Your blood pressure was normal in the emergency department today.  Your chest x-ray did not show any signs of pneumonia.  You are given a prescription for an antibiotic with azithromycin and a steroid with prednisone.  Take as prescribed.  You can use your inhaler 2 to 4 puffs every 6 hours as needed for wheezing.  Prednisone -you are given a prescription for a steroid.  It is important that you take this medication with food.  This medication can cause an upset stomach.  Thank you for choosing [place] for your health care, it was my pleasure to care for you today!  Corena Herter, MD

## 2023-08-29 NOTE — ED Provider Notes (Signed)
[facility] Provider Note    Event Date/Time   First MD Initiated Contact with Patient 08/29/23 0710     (approximate)   History   Fever   HPI  Kirsten [name] is a 77 y.o. female past medical history significant for hypertension, asthma, who presents to the emergency department with a cough.  States that she has been dealing with a cough for the past 1 month.  Endorses a productive cough.  No significant shortness of breath but does endorse some intermittent chest tightness.  Denies nausea, vomiting or diaphoresis.  States that she has been urinating secondary to coughing that has been bothersome to her.  Also states that she has been changing her antihypertensive medications as an outpatient.  States that she is currently on amlodipine.  Endorses intermittent episodes of hot flashes and chills.  States that she is had a fever but then when inquiring further patient has not had a documented fever above 99.  Does endorse feeling full early.  Denies any unexplained weight loss.  Mild intermittent abdominal pain.  No blood in her stool.  Not on anticoagulation.  Denies any falls or trauma.     Physical Exam   Triage Vital Signs: ED Triage Vitals  Encounter Vitals Group     BP 08/29/23 0709 122/73     Systolic BP Percentile --      Diastolic BP Percentile --      Pulse Rate 08/29/23 0709 85     Resp 08/29/23 0709 18     Temp 08/29/23 0709 98 F (36.7 C)     Temp Source 08/29/23 0709 Oral     SpO2 08/29/23 0709 98 %     Weight 08/29/23 0639 116 lb (52.6 kg)     Height 08/29/23 0639 4\' 11"  (1.499 m)     Head Circumference --      Peak Flow --      Pain Score 08/29/23 0639 0     Pain Loc --      Pain Education --      Exclude from Growth Chart --     Most recent vital signs: Vitals:   08/29/23 0709  BP: 122/73  Pulse: 85  Resp: 18  Temp: 98 F (36.7 C)  SpO2: 98%    Physical Exam Constitutional:      Appearance: She is well-developed.   HENT:     Head: Atraumatic.     Mouth/Throat:     Mouth: Mucous membranes are moist.  Eyes:     Extraocular Movements: Extraocular movements intact.     Conjunctiva/sclera: Conjunctivae normal.     Pupils: Pupils are equal, round, and reactive to light.  Cardiovascular:     Rate and Rhythm: Regular rhythm.  Pulmonary:     Effort: Pulmonary effort is normal. No respiratory distress.     Breath sounds: Wheezing present. No rhonchi.  Abdominal:     General: There is no distension.     Tenderness: There is no abdominal tenderness.  Musculoskeletal:        General: Normal range of motion.     Cervical back: Normal range of motion.     Right lower leg: No edema.     Left lower leg: No edema.  Skin:    General: Skin is warm.     Capillary Refill: Capillary refill takes less than 2 seconds.  Neurological:     Mental Status: She is alert. Mental status is at baseline.  IMPRESSION / MDM / ASSESSMENT AND PLAN / ED COURSE  I reviewed the triage vital signs and the nursing notes.  Differential diagnosis including bronchitis, pneumonia, cough secondary to medication, malignancy.  Plan for lab work, chest x-ray and COVID testing.   RADIOLOGY I independently reviewed imaging, my interpretation of imaging: Chest x-ray without obvious focal pneumonia.   Labs (all labs ordered are listed, but only abnormal results are displayed) Labs interpreted as -    Labs Reviewed  CBC WITH DIFFERENTIAL/PLATELET - Abnormal; Notable for the following components:      Result Value   Hemoglobin 10.8 (*)    HCT 32.3 (*)    MCV 77.1 (*)    MCH 25.8 (*)    Platelets 557 (*)    All other components within normal limits  COMPREHENSIVE METABOLIC PANEL - Abnormal; Notable for the following components:   Potassium 3.3 (*)    Glucose, Bld 102 (*)    Calcium 8.5 (*)    Albumin 2.9 (*)    All other components within normal limits  RESP PANEL BY RT-PCR (RSV, FLU A&B, COVID)  [id number]  IRON AND TIBC     Chest x-ray with no findings of acute pneumonia.  Lab work with no leukocytosis.  Does have signs of anemia with downtrending hemoglobin over multiple years.  No signs or symptoms currently of a GI bleed.  Does appear microcytic.  Mildly low potassium at 3.3.  COVID and influenza testing are negative.  Low albumin.  Add on iron studies.  Discussed with the patient for close follow-up with her primary care physician.  Does have mild wheezing on exam and given her history of asthma we will do a course of steroids and azithromycin given her chronic nature of her cough.  On chart review I do not see that she is on an ACE inhibitor to cause her cough, appears to be on Coreg.  Given return precautions for any worsening symptoms.  Discussed close follow-up with primary care physician.     PROCEDURES:  Critical Care performed: No  Procedures  Patient's presentation is most consistent with acute presentation with potential threat to life or bodily function.   MEDICATIONS ORDERED IN ED: Medications - No data to display  FINAL CLINICAL IMPRESSION(S) / ED DIAGNOSES   Final diagnoses:  Viral illness  Anemia, unspecified type  Subacute cough     Rx / DC Orders   ED Discharge Orders          Ordered    predniSONE (DELTASONE) 50 MG tablet  Daily with breakfast        08/29/23 0831    azithromycin (ZITHROMAX Z-PAK) 250 MG tablet        08/29/23 0831             Note:  This document was prepared using Dragon voice recognition software and may include unintentional dictation errors.   Corena Herter, MD 08/29/23 [phone]

## 2023-08-30 ENCOUNTER — Other Ambulatory Visit: Payer: Self-pay

## 2023-08-30 ENCOUNTER — Telehealth: Payer: Self-pay

## 2023-08-30 NOTE — Patient Outreach (Signed)
  Care Management   Follow Up Note   08/30/2023 Name: Kirsten [name] MRN: [id number] DOB: November 16, 1945   Referred by: Mickel Fuchs, MD Reason for referral : Care Management (RNCM: Closing this patient- The patient has established with a new pcp out of the [place] network)   No call made to the patient today. The patient was scheduled; however since last outreach the patient has established with a new pcp out of the [facility]. Goals of care have been completed and no further outreaches with be attempted at this time.   Follow Up Plan: No further follow up required: patient is with new pcp  Alto Denver RN, MSN, CCM RN Care Manager  [facility]  Ambulatory Care Management  Direct Number: [phone]

## 2023-09-25 ENCOUNTER — Other Ambulatory Visit: Payer: Self-pay | Admitting: Family Medicine

## 2023-09-25 DIAGNOSIS — I10 Essential (primary) hypertension: Secondary | ICD-10-CM

## 2023-09-26 NOTE — Telephone Encounter (Signed)
Requested Prescriptions  Pending Prescriptions Disp Refills   carvedilol (COREG) 6.25 MG tablet [Pharmacy Med Name: CARVEDILOL 6.25MG  TABLETS] 180 tablet 0    Sig: TAKE 1 TABLET(6.25 MG) BY MOUTH TWICE DAILY WITH A MEAL     Cardiovascular: Beta Blockers 3 Passed - 09/25/2023 10:06 AM      Passed - Cr in normal range and within 360 days    Creat  Date Value Ref Range Status  04/23/2023 0.97 0.60 - 1.00 mg/dL Final   Creatinine, Ser  Date Value Ref Range Status  08/29/2023 0.83 0.44 - 1.00 mg/dL Final         Passed - AST in normal range and within 360 days    AST  Date Value Ref Range Status  08/29/2023 24 15 - 41 U/L Final   SGOT(AST)  Date Value Ref Range Status  07/17/2014 24 15 - 37 Unit/L Final         Passed - ALT in normal range and within 360 days    ALT  Date Value Ref Range Status  08/29/2023 21 0 - 44 U/L Final   SGPT (ALT)  Date Value Ref Range Status  07/17/2014 18 U/L Final    Comment:    14-63 NOTE: New Reference Range 05/25/14          Passed - Last BP in normal range    BP Readings from Last 1 Encounters:  08/29/23 122/73         Passed - Last Heart Rate in normal range    Pulse Readings from Last 1 Encounters:  08/29/23 85         Passed - Valid encounter within last 6 months    Recent Outpatient Visits           5 months ago Primary hypertension   [place] [facility] Delles, Jackelyn Poling, RPH-CPP   5 months ago Primary hypertension   [place] [facility] Delles, Jackelyn Poling, RPH-CPP   5 months ago Primary hypertension   [place] [facility] Smitty Cords, DO   5 months ago Primary hypertension   [place] [facility] Smitty Cords, DO   6 months ago Restless legs   [place] [facility], Netta Neat, [place]

## 2023-12-21 ENCOUNTER — Ambulatory Visit
Admission: RE | Admit: 2023-12-21 | Discharge: 2023-12-21 | Disposition: A | Discharge status: Home / Self Care | Payer: 59 | Source: Ambulatory Visit | Attending: Family Medicine | Admitting: Family Medicine

## 2023-12-21 ENCOUNTER — Other Ambulatory Visit
Admission: RE | Admit: 2023-12-21 | Discharge: 2023-12-21 | Disposition: A | Discharge status: Home / Self Care | Payer: 59 | Source: Home / Self Care | Attending: Family Medicine | Admitting: Family Medicine

## 2023-12-21 ENCOUNTER — Other Ambulatory Visit: Payer: Self-pay | Admitting: Family Medicine

## 2023-12-21 DIAGNOSIS — Z0000 Encounter for general adult medical examination without abnormal findings: Secondary | ICD-10-CM | POA: Insufficient documentation

## 2023-12-23 ENCOUNTER — Other Ambulatory Visit: Payer: Self-pay | Admitting: Family Medicine

## 2023-12-23 DIAGNOSIS — Z780 Asymptomatic menopausal state: Secondary | ICD-10-CM

## 2024-01-10 ENCOUNTER — Ambulatory Visit
Admission: RE | Admit: 2024-01-10 | Discharge: 2024-01-10 | Disposition: A | Discharge status: Home / Self Care | Payer: 59 | Source: Ambulatory Visit | Attending: Family Medicine | Admitting: Family Medicine

## 2024-01-10 DIAGNOSIS — Z780 Asymptomatic menopausal state: Secondary | ICD-10-CM | POA: Diagnosis present

## 2024-01-28 ENCOUNTER — Other Ambulatory Visit
Admission: RE | Admit: 2024-01-28 | Discharge: 2024-01-28 | Disposition: A | Discharge status: Home / Self Care | Source: Ambulatory Visit | Attending: Pulmonary Disease | Admitting: Pulmonary Disease

## 2024-01-28 ENCOUNTER — Encounter: Payer: Self-pay | Admitting: Pulmonary Disease

## 2024-01-28 ENCOUNTER — Ambulatory Visit: Admitting: Pulmonary Disease

## 2024-01-28 VITALS — BP 128/74 | HR 75 | Resp 14 | Ht 59.0 in | Wt 119.0 lb

## 2024-01-28 DIAGNOSIS — J4530 Mild persistent asthma, uncomplicated: Secondary | ICD-10-CM | POA: Diagnosis present

## 2024-01-28 DIAGNOSIS — J4540 Moderate persistent asthma, uncomplicated: Secondary | ICD-10-CM

## 2024-01-28 MED ORDER — BUDESONIDE-FORMOTEROL FUMARATE 80-4.5 MCG/ACT IN AERO: 2.0000 | INHALATION_SPRAY | Freq: Two times a day (BID) | RESPIRATORY_TRACT | 12 refills | Status: AC

## 2024-01-28 NOTE — Progress Notes (Signed)
 Synopsis: Referred in by Mickel Fuchs, MD   Subjective:   PATIENT ID: Kirsten [name] DOB: 1946/09/07, MRN: [id number]  Chief Complaint  Patient presents with   Consult    Asthma, phlegm in throat all the time. It chokes her when she lays down. Cough all the time, worse at night. Uses Albuterol every once in a while.-gives her the shakes    HPI Kirsten [name] is a pleasant 78 year old [name] patient   with a past medical history of neuropathic pain, hypertension presenting today to the pulmonary clinic for further evaluation of her asthma.  She reports that she was diagnosed with asthma many years ago and has been on albuterol inhaler that she uses few times a week.  She was never hospitalized for asthma.  Her symptoms include shortness of breath on exertion chest tightness and wheezing.  She does also report intermittent dry cough that goes into coughing spells for about 20 seconds.  She denies any chest pain, GERD.  But does report seasonal allergies as well as eczema.  Her asthma symptoms are usually flared up by pollen.  She denies hypersensitivity to strong scents or cold air.  Family history -she denies any family history of asthma.  Social history -never smoker denies any alcohol use.  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  ROS All systems were reviewed and are negative except for the above. Objective:   Vitals:   01/28/24 0903  BP: 128/74  Pulse: 75  Resp: 14  SpO2: 99%  Weight: 119 lb (54 kg)  Height: 4\' 11"  (1.499 m)   99% on RA BMI Readings from Last 3 Encounters:  01/28/24 24.04 kg/m  08/29/23 23.43 kg/m  05/17/23 21.73 kg/m   Wt Readings from Last 3 Encounters:  01/28/24 119 lb (54 kg)  08/29/23 116 lb (52.6 kg)  05/17/23 115 lb (52.2 kg)    Physical Exam GEN: NAD, Healthy Appearing HEENT: Supple Neck, Reactive Pupils, EOMI  CVS: Normal S1, Normal S2, RRR, No murmurs or ES appreciated  Lungs: Clear bilateral air  entry.  Abdomen: Soft, non tender, non distended, + BS  Extremities: Warm and well perfused, No edema  Skin: No suspicious lesions appreciated  Psych: Normal Affect  Ancillary Information   CBC    Component Value Date/Time   WBC 9.9 08/29/2023 0738   RBC 4.19 08/29/2023 0738   HGB 10.8 (L) 08/29/2023 0738   HGB 12.0 11/18/2015 0947   HCT 32.3 (L) 08/29/2023 0738   HCT 37.1 11/18/2015 0947   PLT 557 (H) 08/29/2023 0738   PLT 308 11/18/2015 0947   MCV 77.1 (L) 08/29/2023 0738   MCV 84 11/18/2015 0947   MCV 85 09/13/2014 0942   MCH 25.8 (L) 08/29/2023 0738   MCHC 33.4 08/29/2023 0738   RDW 13.8 08/29/2023 0738   RDW 14.8 11/18/2015 0947   RDW 14.7 (H) 09/13/2014 0942   LYMPHSABS 1.7 08/29/2023 0738   LYMPHSABS 2.3 11/18/2015 0947   LYMPHSABS 1.9 07/18/2014 0509   MONOABS 0.9 08/29/2023 0738   MONOABS 0.8 07/18/2014 0509   EOSABS 0.2 08/29/2023 0738   EOSABS 0.1 11/18/2015 0947   EOSABS 0.0 07/18/2014 0509   BASOSABS 0.0 08/29/2023 0738   BASOSABS 0.0 11/18/2015 0947   BASOSABS 0.0 07/18/2014 0509    Labs and imaging were reviewed.     No data to display           Assessment & Plan:  Kirsten [name]  is a pleasant 78 year old [name] patient   with a past medical history of neuropathic pain, hypertension presenting today to the pulmonary clinic for further evaluation of her asthma.  # Moderate persistent asthma  []  PFTs and allergen test []  Start budesonide-formoterol [Symbicort] 80-4.52 puffs twice a day. []  Continue with albuterol on an as-needed basis.  Return in about 3 months (around 04/29/2024) for with PFTs.  I spent 60 minutes caring for this patient today, including preparing to see the patient, obtaining a medical history , reviewing a separately obtained history, performing a medically appropriate examination and/or evaluation, counseling and educating the patient/family/caregiver, ordering medications, tests, or procedures, documenting clinical  information in the electronic health record, and independently interpreting results (not separately reported/billed) and communicating results to the patient/family/caregiver  Janann Colonel, MD [facility] 01/28/2024 5:23 PM

## 2024-02-05 LAB — ALLERGEN PANEL (27) + IGE
Alternaria Alternata IgE: <0.1 kU/L
Aspergillus Fumigatus IgE: <0.1 kU/L
Bahia Grass IgE: <0.1 kU/L
Bermuda Grass IgE: <0.1 kU/L
Cat Dander IgE: <0.1 kU/L
Cedar, Mountain IgE: <0.1 kU/L
Cladosporium Herbarum IgE: <0.1 kU/L
Cocklebur IgE: <0.1 kU/L
Cockroach, American IgE: <0.1 kU/L
Common Silver Birch IgE: <0.1 kU/L
D Farinae IgE: <0.1 kU/L
D Pteronyssinus IgE: <0.1 kU/L
Dog Dander IgE: <0.1 kU/L
Elm, American IgE: <0.1 kU/L
Hickory, White IgE: <0.1 kU/L
IgE (Immunoglobulin E), Serum: 50 [IU]/mL (ref 6–495)
Johnson Grass IgE: <0.1 kU/L
Kentucky Bluegrass IgE: <0.1 kU/L
Maple/Box Elder IgE: <0.1 kU/L
Mucor Racemosus IgE: <0.1 kU/L
Oak, White IgE: <0.1 kU/L
Penicillium Chrysogen IgE: <0.1 kU/L
Pigweed, Rough IgE: <0.1 kU/L
Plantain, English IgE: <0.1 kU/L
Ragweed, Short IgE: <0.1 kU/L
Setomelanomma Rostrat: <0.1 kU/L
Timothy Grass IgE: <0.1 kU/L
White Mulberry IgE: <0.1 kU/L

## 2024-05-21 ENCOUNTER — Ambulatory Visit: Payer: 59

## 2024-05-21 ENCOUNTER — Telehealth: Payer: Self-pay

## 2024-05-21 NOTE — Telephone Encounter (Signed)
 Unsuccessful attempts to reach patient on preferred number listed in notes for scheduled AWV Unable to leave message.

## 2024-05-25 ENCOUNTER — Telehealth: Payer: Self-pay

## 2024-05-25 NOTE — Telephone Encounter (Signed)
 Copied from CRM [id number]. Topic: Complaint (DO NOT CONVERT) - Care >> May 25, 2024 12:01 PM Harlene ORN wrote: Date of Incident: 05/21/2024 Details of complaint: patient was no longer with [facility], because she had an incident a while back with the receptionist at the front desk. They called her cousin as a contact to tell her the [place] that they have been trying to get into contact with her for an appointment with Dr. MARLA. How would the patient like to see it resolved? Needs to speak to Dr. MARLA about this situation On a scale of 1-10, how was your experience? 1 What would it take to bring it to a 10? Speak with Dr. MARLA to confront the front desk situation  Route to Research officer, political party.

## 2024-05-27 ENCOUNTER — Ambulatory Visit: Admitting: Pulmonary Disease

## 2024-05-27 NOTE — Telephone Encounter (Signed)
 I have spoke to patient.  Per patients request I called [employer] and changed her providers name back to Dr. Debby Easterly.  I also verified that she has an appointment with him on 06/05/24.  Patient was notified

## 2024-06-10 ENCOUNTER — Ambulatory Visit

## 2024-06-10 ENCOUNTER — Ambulatory Visit: Payer: Self-pay

## 2024-06-10 DIAGNOSIS — R7611 Nonspecific reaction to tuberculin skin test without active tuberculosis: Secondary | ICD-10-CM

## 2024-06-10 DIAGNOSIS — R7612 Nonspecific reaction to cell mediated immunity measurement of gamma interferon antigen response without active tuberculosis: Secondary | ICD-10-CM | POA: Insufficient documentation

## 2024-06-10 NOTE — Telephone Encounter (Signed)
 Pt declined triage. New PCP office created new PCP appt for 10/6.  Pt stated, I can hold out til then. Copied from CRM #[id number]. Topic: Clinical - Red Word Triage >> Jun 10, 2024 10:58 AM Ivette P wrote: [facility] that prompted transfer to Nurse Triage: Pt reporting having high fever and having high blood pressure, when asked when last reading was stated 2 days ago. Pt warm and then gets hot.

## 2024-06-10 NOTE — Progress Notes (Cosign Needed Addendum)
 Pt is a 78 year old female screened for TB with TST on 06/05/24 which was positive at 20 mm. She was screened by her PCP because of symptoms: 3 month history of cough, night sweats, fever, and shortness of breath (SOB associated w/ asthma, relieved by inhaler, which she uses bid). Chest x-ray on 06/09/24 was negative (see [facility]). Will f/u with pt pending review by Dr. Herlene.  Delon LITTIE Primrose, RN

## 2024-06-11 ENCOUNTER — Telehealth: Payer: Self-pay

## 2024-06-11 ENCOUNTER — Telehealth: Payer: Self-pay | Admitting: Pulmonary Disease

## 2024-06-11 NOTE — Telephone Encounter (Signed)
 Due to scheduling restrictions call office to schedule patient.

## 2024-06-11 NOTE — Telephone Encounter (Addendum)
 Phone call to patient to follow-up TB screening/evaluation to r/o active disease. Dr. Herlene has recommended sputum collection.  Lymph node exam will be done. Voice message left asking patient to contact TB clinic nurse at [phone]. Almarie Metro, RN

## 2024-06-12 ENCOUNTER — Telehealth: Payer: Self-pay

## 2024-06-12 NOTE — Telephone Encounter (Signed)
 Phone call to patient, Kirsten [name] answered call, I reviewed with her the recommendation of sputum collection, Quantiferon TB test, and stressed the importance of evaluation by health department to r/o active TB disease. She declined sputum collection, further testing, or evaluation at this time. she expressed feeling tired of being advised about tuberculosis testing.  Patient states she had scheduled an appointment with a new PCP in 08/2024, and will ask her new provider to contact TB clinic nurse if there is additional concerns related to this matter.  Patient response shared with Dr. Herlene for further guidance. Kirsten Metro, RN

## 2024-06-25 ENCOUNTER — Encounter: Payer: Self-pay | Admitting: Surgery

## 2024-06-25 NOTE — Progress Notes (Signed)
+  QFT: Not performed   PPD+: 20 mm read 06/08/2024  EPI: 06/10/2024  CXR: neg 06/08/2024  Called patient one more time to offer free QFT testing in light of recent +PPD skin test. Patient seemed angry on phone, asked me to not call her anymore before I could give my name or explain why I was calling, and hung up on me.  Patient had 20mm +PPD test at PCP's office with CXR that was negative/normal. Patient, however, did report concerning symptoms at that time including cough, night sweats, fever and SOB.   Patient does have a history of asthma and reports SOB relieved by inhalers per clinic notes.  Patient declined further follow up with TB nurse who spoke to patient about 2 weeks ago. Wanted to contact patient one more time to offer free follow QFT testing to confirm/refute LTBI diagnosis, answer any questions she may have about the diagnosis of latent TB and determine whether or not sputum testing would be necessary.   Unfortunately patient declined further interview or follow up.   Patient is closed to further TB follow up at this time.   HERLENE DELON HERO, MD

## 2024-08-10 ENCOUNTER — Ambulatory Visit: Admitting: Internal Medicine

## 2024-08-10 ENCOUNTER — Encounter: Payer: Self-pay | Admitting: Internal Medicine

## 2024-08-10 ENCOUNTER — Other Ambulatory Visit: Payer: Self-pay

## 2024-08-10 VITALS — BP 154/86 | HR 74 | Temp 97.8°F | Resp 16 | Ht 60.0 in | Wt 122.4 lb

## 2024-08-10 DIAGNOSIS — J4540 Moderate persistent asthma, uncomplicated: Secondary | ICD-10-CM | POA: Diagnosis not present

## 2024-08-10 DIAGNOSIS — R739 Hyperglycemia, unspecified: Secondary | ICD-10-CM | POA: Diagnosis not present

## 2024-08-10 DIAGNOSIS — D649 Anemia, unspecified: Secondary | ICD-10-CM

## 2024-08-10 DIAGNOSIS — G2581 Restless legs syndrome: Secondary | ICD-10-CM | POA: Diagnosis not present

## 2024-08-10 DIAGNOSIS — N393 Stress incontinence (female) (male): Secondary | ICD-10-CM

## 2024-08-10 DIAGNOSIS — I10 Essential (primary) hypertension: Secondary | ICD-10-CM

## 2024-08-10 DIAGNOSIS — R232 Flushing: Secondary | ICD-10-CM | POA: Diagnosis not present

## 2024-08-10 DIAGNOSIS — E782 Mixed hyperlipidemia: Secondary | ICD-10-CM | POA: Diagnosis not present

## 2024-08-10 DIAGNOSIS — Z23 Encounter for immunization: Secondary | ICD-10-CM | POA: Diagnosis not present

## 2024-08-10 MED ORDER — GABAPENTIN 300 MG PO CAPS: 300.0000 mg | ORAL_CAPSULE | Freq: Every day | ORAL | 1 refills | Status: AC

## 2024-08-10 MED ORDER — ALBUTEROL SULFATE HFA 108 (90 BASE) MCG/ACT IN AERS: 2.0000 | INHALATION_SPRAY | Freq: Four times a day (QID) | RESPIRATORY_TRACT | 3 refills | Status: AC | PRN

## 2024-08-10 MED ORDER — ROSUVASTATIN CALCIUM 20 MG PO TABS: 20.0000 mg | ORAL_TABLET | Freq: Every day | ORAL | 3 refills | Status: DC

## 2024-08-10 MED ORDER — AMLODIPINE BESY-BENAZEPRIL HCL 10-40 MG PO CAPS: 1.0000 | ORAL_CAPSULE | Freq: Every day | ORAL | 0 refills | Status: DC
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
## 2024-08-11 ENCOUNTER — Ambulatory Visit: Payer: Self-pay | Admitting: Internal Medicine

## 2024-08-11 LAB — COMPREHENSIVE METABOLIC PANEL WITH GFR
AG Ratio: 1.6 (calc) (ref 1.0–2.5)
ALT: 25 U/L (ref 6–29)
AST: 24 U/L (ref 10–35)
Albumin: 4.2 g/dL (ref 3.6–5.1)
Alkaline phosphatase (APISO): 79 U/L (ref 37–153)
BUN/Creatinine Ratio: 11 (calc) (ref 6–22)
BUN: 11 mg/dL (ref 7–25)
CO2: 25 mmol/L (ref 20–32)
Calcium: 9.2 mg/dL (ref 8.6–10.4)
Chloride: 104 mmol/L (ref 98–110)
Creat: 1.01 mg/dL — ABNORMAL HIGH (ref 0.60–1.00)
Globulin: 2.7 g/dL (ref 1.9–3.7)
Glucose, Bld: 85 mg/dL (ref 65–99)
Potassium: 4.5 mmol/L (ref 3.5–5.3)
Sodium: 139 mmol/L (ref 135–146)
Total Bilirubin: 0.2 mg/dL (ref 0.2–1.2)
Total Protein: 6.9 g/dL (ref 6.1–8.1)
eGFR: 58 mL/min/1.73m2 — ABNORMAL LOW (ref >=60)

## 2024-08-11 LAB — LIPID PANEL
Cholesterol: 258 mg/dL — ABNORMAL HIGH (ref <200)
HDL: 51 mg/dL (ref >=50)
LDL Cholesterol (Calc): 175 mg/dL — ABNORMAL HIGH
Non-HDL Cholesterol (Calc): 207 mg/dL — ABNORMAL HIGH (ref <130)
Total CHOL/HDL Ratio: 5.1 (calc) — ABNORMAL HIGH (ref <5.0)
Triglycerides: 174 mg/dL — ABNORMAL HIGH (ref <150)

## 2024-08-11 LAB — CBC WITH DIFFERENTIAL/PLATELET
Absolute Lymphocytes: 2584 {cells}/uL (ref 850–3900)
Absolute Monocytes: 628 {cells}/uL (ref 200–950)
Basophils Absolute: 27 {cells}/uL (ref 0–200)
Basophils Relative: 0.3 %
Eosinophils Absolute: 73 {cells}/uL (ref 15–500)
Eosinophils Relative: 0.8 %
HCT: 42.3 % (ref 35.0–45.0)
Hemoglobin: 13 g/dL (ref 11.7–15.5)
MCH: 26.9 pg — ABNORMAL LOW (ref 27.0–33.0)
MCHC: 30.7 g/dL — ABNORMAL LOW (ref 32.0–36.0)
MCV: 87.4 fL (ref 80.0–100.0)
MPV: 11.4 fL (ref 7.5–12.5)
Monocytes Relative: 6.9 %
Neutro Abs: 5788 {cells}/uL (ref 1500–7800)
Neutrophils Relative %: 63.6 %
Platelets: 346 Thousand/uL (ref 140–400)
RBC: 4.84 Million/uL (ref 3.80–5.10)
RDW: 14.1 % (ref 11.0–15.0)
Total Lymphocyte: 28.4 %
WBC: 9.1 Thousand/uL (ref 3.8–10.8)

## 2024-08-11 LAB — IRON,TIBC AND FERRITIN PANEL
%SAT: 7 % — ABNORMAL LOW (ref 16–45)
Ferritin: 16 ng/mL (ref 16–288)
Iron: 28 ug/dL — ABNORMAL LOW (ref 45–160)
TIBC: 387 ug/dL (ref 250–450)

## 2024-08-11 LAB — HEMOGLOBIN A1C
Hgb A1c MFr Bld: 5.6 % (ref <5.7)
Mean Plasma Glucose: 114 mg/dL
eAG (mmol/L): 6.3 mmol/L

## 2024-08-11 LAB — TSH: TSH: 2.02 m[IU]/L (ref 0.40–4.50)

## 2024-08-17 ENCOUNTER — Telehealth: Payer: Self-pay

## 2024-08-17 NOTE — Telephone Encounter (Unsigned)
 Copied from CRM [id number]. Topic: Clinical - Order For Equipment >> Aug 17, 2024  8:26 AM Berneda FALCON wrote: Reason for CRM: Patient states that there was a request for pads that was sent over and they received the forms back but the second form was missing a signature. She is trying to check on this and see if the second form could be sent back with the signature. Did not know the name of the company.  Patient callback is [phone]

## 2024-08-17 NOTE — Telephone Encounter (Signed)
 I have a new for for signature request from [employer]. WIll send back

## 2024-09-08 ENCOUNTER — Encounter: Payer: Self-pay | Admitting: Internal Medicine

## 2024-09-08 ENCOUNTER — Ambulatory Visit (INDEPENDENT_AMBULATORY_CARE_PROVIDER_SITE_OTHER): Admitting: Internal Medicine

## 2024-09-08 ENCOUNTER — Other Ambulatory Visit: Payer: Self-pay

## 2024-09-08 VITALS — BP 138/80 | HR 76 | Temp 97.9°F | Resp 16 | Ht 60.0 in | Wt 124.5 lb

## 2024-09-08 DIAGNOSIS — I10 Essential (primary) hypertension: Secondary | ICD-10-CM

## 2024-09-08 DIAGNOSIS — E782 Mixed hyperlipidemia: Secondary | ICD-10-CM

## 2024-09-08 DIAGNOSIS — J3489 Other specified disorders of nose and nasal sinuses: Secondary | ICD-10-CM | POA: Diagnosis not present

## 2024-09-08 MED ORDER — AMLODIPINE BESY-BENAZEPRIL HCL 10-40 MG PO CAPS: 1.0000 | ORAL_CAPSULE | Freq: Every day | ORAL | 1 refills | Status: AC

## 2024-09-08 MED ORDER — ROSUVASTATIN CALCIUM 20 MG PO TABS: 20.0000 mg | ORAL_TABLET | Freq: Every day | ORAL | 3 refills | Status: AC

## 2024-09-08 MED ORDER — FLUTICASONE PROPIONATE 50 MCG/ACT NA SUSP: 2.0000 | Freq: Every day | NASAL | 6 refills | Status: AC
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
## 2024-09-14 ENCOUNTER — Telehealth: Payer: Self-pay

## 2024-09-14 NOTE — Telephone Encounter (Signed)
 Copied from CRM [id number]. Topic: Clinical - Prescription Issue >> Sep 14, 2024  8:29 AM Everette C wrote: Reason for CRM: The patient has called for an update on their previously discussed prescription change. The patient shares that their size of briefs needs to be changed from small to medium for their next order of incontinence supplies. Please contact the patient further if needed. The patient shares that their supply provider will be sending documents to the practice regarding the change.

## 2024-09-14 NOTE — Telephone Encounter (Signed)
 Pretty sure it was regarding the brief size.  Left detailed vm with patient, to update that information with her supplier and have them send paperwork with that information and we can do that for her.

## 2024-09-16 ENCOUNTER — Telehealth: Payer: Self-pay

## 2024-09-16 NOTE — Telephone Encounter (Signed)
 Called and they will send new order to be signed

## 2024-09-16 NOTE — Telephone Encounter (Signed)
 Copied from CRM [id number]. Topic: Clinical - Order For Equipment >> Sep 16, 2024  8:34 AM Victoria B wrote: Reason for CRM: Patient  called in states [employer] sent in order for pull ups. The ones she received are too small so needs a bigger size

## 2024-10-14 ENCOUNTER — Ambulatory Visit

## 2024-10-20 ENCOUNTER — Telehealth: Payer: Self-pay | Admitting: Internal Medicine

## 2024-10-20 NOTE — Telephone Encounter (Unsigned)
 Copied from CRM #[id number]. Topic: General - Other >> Oct 20, 2024 10:39 AM Rosaria BRAVO wrote: Reason for CRM: Amy from [facility] called regarding a chronic condition special needs plan for the pt, needs to verify if pt has a chronic condition.   Best contact: [phone] option 1   States form was sent 10/17/2024. Needs to verify if office received this.

## 2024-10-21 NOTE — Telephone Encounter (Signed)
 Needs to be refaxed tried to call

## 2024-11-08 ENCOUNTER — Other Ambulatory Visit: Payer: Self-pay | Admitting: Internal Medicine

## 2024-11-08 DIAGNOSIS — I10 Essential (primary) hypertension: Secondary | ICD-10-CM

## 2024-11-09 NOTE — Telephone Encounter (Signed)
 Mliss Ned, Patient's Medicare Broker is calling to follow up that [facility] faxed a chronic CSNP Verification Form. Reporting that this form has not been returned.    [employer] is able to take a verbal over the phone    Best contact: [phone] option 1    States form was sent 10/17/2024. Needs to verify if office received this.  Julie Thomas or patient  is calling to request a call back when this has been completed. Mliss Ned- [phone]

## 2024-11-09 NOTE — Telephone Encounter (Signed)
 Tried to call hit option 1 but no extension left? Please refax form if they call back

## 2024-11-10 NOTE — Telephone Encounter (Signed)
 Rx 09/08/24 #90 1RF- too soon Requested Prescriptions  Pending Prescriptions Disp Refills   amLODipine -benazepril  (LOTREL) 10-40 MG capsule [Pharmacy Med Name: AMLODIPINE -BENAZ 10/40MG  CAPSULES] 90 capsule 1    Sig: TAKE 1 CAPSULE BY MOUTH DAILY     Cardiovascular: CCB + ACEI Combos Failed - 11/10/2024  9:47 AM      Failed - Cr in normal range and within 180 days    Creat  Date Value Ref Range Status  08/10/2024 1.01 (H) 0.60 - 1.00 mg/dL Final         Passed - K in normal range and within 180 days    Potassium  Date Value Ref Range Status  08/10/2024 4.5 3.5 - 5.3 mmol/L Final  09/13/2014 3.8 3.5 - 5.1 mmol/L Final         Passed - Na in normal range and within 180 days    Sodium  Date Value Ref Range Status  08/10/2024 139 135 - 146 mmol/L Final  11/09/2015 145 (H) 134 - 144 mmol/L Final  09/13/2014 138 136 - 145 mmol/L Final         Passed - eGFR is 30 or above and within 180 days    GFR, Est African American  Date Value Ref Range Status  09/08/2018 69 > OR = 60 mL/min/1.53m2 Final   GFR, Est Non African American  Date Value Ref Range Status  09/08/2018 59 (L) > OR = 60 mL/min/1.10m2 Final   GFR, Estimated  Date Value Ref Range Status  08/29/2023 >60 >60 mL/min Final    Comment:    (NOTE) Calculated using the CKD-EPI Creatinine Equation (2021)    eGFR  Date Value Ref Range Status  08/10/2024 58 (L) > OR = 60 mL/min/1.5m2 Final         Passed - Patient is not pregnant      Passed - Last BP in normal range    BP Readings from Last 1 Encounters:  09/08/24 138/80         Passed - Valid encounter within last 6 months    Recent Outpatient Visits           2 months ago Primary hypertension   [facility] Bernardo Fend, DO   3 months ago Primary hypertension   [facility] Bernardo Fend, [place]

## 2024-12-11 ENCOUNTER — Ambulatory Visit (INDEPENDENT_AMBULATORY_CARE_PROVIDER_SITE_OTHER)

## 2024-12-11 VITALS — BP 148/78 | Ht 60.0 in | Wt 124.4 lb

## 2024-12-11 DIAGNOSIS — Z0000 Encounter for general adult medical examination without abnormal findings: Secondary | ICD-10-CM

## 2024-12-11 NOTE — Patient Instructions (Addendum)
 Kirsten [name],  Thank you for taking the time for your Medicare Wellness Visit. I appreciate your continued commitment to your health goals. Please review the care plan we discussed, and feel free to reach out if I can assist you further.  Please note that Annual Wellness Visits do not include a physical exam. Some assessments may be limited, especially if the visit was conducted virtually. If needed, we may recommend an in-person follow-up with your provider.  Ongoing Care Seeing your primary care provider every 3 to 6 months helps us  monitor your health and provide consistent, personalized care. 03/08/25 @ 9:20 AM APPT W/ DR.[email or web address]  Referrals If a referral was made during today's visit and you haven't received any updates within two weeks, please contact the referred provider directly to check on the status.  Recommended Screenings:  Health Maintenance  Topic Date Due   DTaP/Tdap/Td vaccine (2 - Td or Tdap) 02/11/2024   Medicare Annual Wellness Visit  05/16/2024   Osteoporosis screening with Bone Density Scan  01/09/2026   Pneumococcal Vaccine for age over 69  Completed   Flu Shot  Completed   Hepatitis C Screening  Completed   Zoster (Shingles) Vaccine  Completed   Meningitis B Vaccine  Aged Out   Breast Cancer Screening  Discontinued   Colon Cancer Screening  Discontinued   COVID-19 Vaccine  Discontinued     Vision: Annual vision screenings are recommended for early detection of glaucoma, cataracts, and diabetic retinopathy. These exams can also reveal signs of chronic conditions such as diabetes and high blood pressure.  Dental: Annual dental screenings help detect early signs of oral cancer, gum disease, and other conditions linked to overall health, including heart disease and diabetes.  Please see the attached documents for additional preventive care recommendations.   NEXT AWV 12/16/25 @ 9:30 AM IN PERSON

## 2024-12-11 NOTE — Progress Notes (Signed)
 "  Chief Complaint  Patient presents with   Medicare Wellness     Subjective:   Kirsten [name] is a 79 y.o. female who presents for a Medicare Annual Wellness Visit.  Visit info / Clinical Intake: Medicare Wellness Visit Type:: Subsequent Annual Wellness Visit Persons participating in visit and providing information:: patient Medicare Wellness Visit Mode:: In-person (required for WTM) Interpreter Needed?: No Pre-visit prep was completed: yes AWV questionnaire completed by patient prior to visit?: no Living arrangements:: (!) lives alone Patient's Overall Health Status Rating: (!) fair Typical amount of pain: none Does pain affect daily life?: no Are you currently prescribed opioids?: no  Dietary Habits and Nutritional Risks How many meals a day?: other: (small meals thru the day) Eats fruit and vegetables daily?: (!) no Most meals are obtained by: preparing own meals In the last 2 weeks, have you had any of the following?: none Diabetic:: no  Functional Status Activities of Daily Living (to include ambulation/medication): Independent Ambulation: Independent Medication Administration: Independent Home Management (perform basic housework or laundry): Independent Manage your own finances?: yes Primary transportation is: driving Concerns about vision?: no *vision screening is required for WTM* (rx glasses- [facility]- YEAR[facility]) Concerns about hearing?: no  Fall Screening Falls in the past year?: 0 Number of falls in past year: 0 Was there an injury with Fall?: 0 Fall Risk Category Calculator: 0 Patient Fall Risk Level: Low Fall Risk  Fall Risk Patient at Risk for Falls Due to: No Fall Risks Fall risk Follow up: Falls evaluation completed; Falls prevention discussed  Home and Transportation Safety: All rugs have non-skid backing?: yes All stairs or steps have railings?: (!) no Grab bars in the bathtub or shower?: (!) no (HAS SHOWER CHAIR) Have non-skid surface in  bathtub or shower?: yes Good home lighting?: yes Regular seat belt use?: yes Hospital stays in the last year:: no  Cognitive Assessment Difficulty concentrating, remembering, or making decisions? : yes (MEMORY) Will 6CIT or Mini Cog be Completed: yes What year is it?: 0 points What month is it?: 0 points Give patient an address phrase to remember (5 components): [address] [place], [place] About what time is it?: 0 points Count backwards from 20 to 1: 0 points Say the months of the year in reverse: 4 points Repeat the address phrase from earlier: 0 points 6 CIT Score: 4 points  Advance Directives (For Healthcare) Does Patient Have a Medical Advance Directive?: No Would patient like information on creating a medical advance directive?: No - Patient declined  Reviewed/Updated  Reviewed/Updated: Reviewed All (Medical, Surgical, Family, Medications, Allergies, Care Teams, Patient Goals)    Allergies (verified) Hydrochlorothiazide    Current Medications (verified) Outpatient Encounter Medications as of 12/11/2024  Medication Sig   albuterol  (PROVENTIL  HFA) 108 (90 Base) MCG/ACT inhaler Inhale 2 puffs into the lungs every 6 (six) hours as needed for wheezing or shortness of breath.   amLODipine -benazepril  (LOTREL) 10-40 MG capsule Take 1 capsule by mouth daily.   aspirin  EC 81 MG tablet Take 1 tablet (81 mg total) by mouth daily.   budesonide -formoterol  (SYMBICORT ) 80-4.5 MCG/ACT inhaler Inhale 2 puffs into the lungs in the morning and at bedtime.   carvedilol  (COREG ) 6.25 MG tablet TAKE 1 TABLET(6.25 MG) BY MOUTH TWICE DAILY WITH A MEAL   fluticasone  (FLONASE ) 50 MCG/ACT nasal spray Place 2 sprays into both nostrils daily.   gabapentin  (NEURONTIN ) 300 MG capsule Take 1 capsule (300 mg total) by mouth at bedtime.   omega-3  acid ethyl esters (LOVAZA) 1 g capsule Take 1 g by mouth daily.   Respir Rate Dev-Bld Press Mon (RESPERATE 1.0) KIT Use daily to help lower blood pressure    rosuvastatin  (CRESTOR ) 20 MG tablet Take 1 tablet (20 mg total) by mouth at bedtime.   loratadine  (CLARITIN ) 10 MG tablet Take 1 tablet (10 mg total) by mouth daily. (Patient not taking: Reported on 12/11/2024)   No facility-administered encounter medications on file as of 12/11/2024.    History: Past Medical History:  Diagnosis Date   Allergy    Arthritis    Asthma    Back injury    Heart disease    History of blood clots    History of chicken pox    Hyperlipidemia    Hypertension    Memory loss    Migraine headache    Myocardial infarction [facility])    Past Surgical History:  Procedure Laterality Date   ABDOMINAL HYSTERECTOMY     APPENDECTOMY     BOWEL RESECTION     COLONOSCOPY WITH PROPOFOL  N/A 06/17/2017   Procedure: COLONOSCOPY WITH PROPOFOL ;  Surgeon: Gaylyn Gladis PENNER, MD;  Location: [facility] ENDOSCOPY;  Service: Endoscopy;  Laterality: N/A;   Family History  Problem Relation Age of Onset   Cancer Father        unknown type   Social History   Occupational History   Not on file  Tobacco Use   Smoking status: Never   Smokeless tobacco: Never  Vaping Use   Vaping status: Never Used  Substance and Sexual Activity   Alcohol use: No    Alcohol/week: 0.0 standard drinks of alcohol   Drug use: No   Sexual activity: Not on file   Tobacco Counseling Counseling given: Not Answered  SDOH Screenings   Food Insecurity: No Food Insecurity (12/11/2024)  Housing: Low Risk (12/11/2024)  Transportation Needs: No Transportation Needs (12/11/2024)  Utilities: Not At Risk (12/11/2024)  Alcohol Screen: Low Risk (08/10/2024)  Depression (PHQ2-9): Low Risk (12/11/2024)  Financial Resource Strain: Low Risk (05/17/2023)  Physical Activity: Inactive (12/11/2024)  Social Connections: Moderately Isolated (12/11/2024)  Stress: No Stress Concern Present (12/11/2024)  Tobacco Use: Low Risk (12/11/2024)  Health Literacy: Adequate Health Literacy (12/11/2024)   See flowsheets for full screening  details  Depression Screen PHQ 2 & 9 Depression Scale- Over the past 2 weeks, how often have you been bothered by any of the following problems? Little interest or pleasure in doing things: 0 Feeling down, depressed, or hopeless (PHQ Adolescent also includes...irritable): 0 PHQ-2 Total Score: 0 Trouble falling or staying asleep, or sleeping too much: 0 Feeling tired or having little energy: 0 Poor appetite or overeating (PHQ Adolescent also includes...weight loss): 0 Feeling bad about yourself - or that you are a failure or have let yourself or your family down: 0 Trouble concentrating on things, such as reading the newspaper or watching television (PHQ Adolescent also includes...like school work): 0 Moving or speaking so slowly that other people could have noticed. Or the opposite - being so fidgety or restless that you have been moving around a lot more than usual: 0 Thoughts that you would be better off dead, or of hurting yourself in some way: 0 PHQ-9 Total Score: 0 If you checked off any problems, how difficult have these problems made it for you to do your work, take care of things at home, or get along with other people?: Not difficult at all  Depression Treatment Depression Interventions/Treatment : [id number] Score <  4 Follow-up Not Indicated     Goals Addressed             This Visit's Progress    DIET - REDUCE SUGAR INTAKE               Objective:    Today's Vitals   12/11/24 0811  BP: (!) 148/78  Weight: 124 lb 6.4 oz (56.4 kg)  Height: 5' (1.524 m)   Body mass index is 24.3 kg/m.  Hearing/Vision screen No results found. Immunizations and Health Maintenance Health Maintenance  Topic Date Due   DTaP/Tdap/Td (2 - Td or Tdap) 02/11/2024   Medicare Annual Wellness (AWV)  12/11/2025   Bone Density Scan  01/09/2026   Pneumococcal Vaccine: 50+ Years  Completed   Influenza Vaccine  Completed   Hepatitis C Screening  Completed   Zoster Vaccines- Shingrix    Completed   Meningococcal B Vaccine  Aged Out   Mammogram  Discontinued   Colonoscopy  Discontinued   COVID-19 Vaccine  Discontinued        Assessment/Plan:  This is a routine wellness examination for [facility].  Patient Care Team: Bernardo Fend, DO as PCP - General (Internal Medicine) Elma Zachary RAMAN [facility])  I have personally reviewed and noted the following in the patients chart:   Medical and social history Use of alcohol, tobacco or illicit drugs  Current medications and supplements including opioid prescriptions. Functional ability and status Nutritional status Physical activity Advanced directives List of other physicians Hospitalizations, surgeries, and ER visits in previous 12 months Vitals Screenings to include cognitive, depression, and falls Referrals and appointments  No orders of the defined types were placed in this encounter.  In addition, I have reviewed and discussed with patient certain preventive protocols, quality metrics, and best practice recommendations. A written personalized care plan for preventive services as well as general preventive health recommendations were provided to patient.   Jhonnie RAMAN Das, LPN   05/07/7972   Return in about 1 year (around 12/11/2025).  After Visit Summary: (In Person-Declined) Patient declined AVS at this time.  Nurse Notes: UTD ON SHOTS EXCEPT TDAP; AGED OUT OF MAMMOGRAM & COLONOSCOPY; UTD ON BDS  No voiced or noted concerns at this time  "

## 2025-03-08 ENCOUNTER — Ambulatory Visit: Admitting: Internal Medicine

## 2025-05-24 ENCOUNTER — Ambulatory Visit: Admitting: Family Medicine

## 2025-12-16 ENCOUNTER — Ambulatory Visit
